# Patient Record
Sex: Female | Born: 1980 | Race: White | Hispanic: No | Marital: Married | Smoking: Never smoker
Health system: Southern US, Community
[De-identification: ages and names within clinical notes are randomized; demographics above are authoritative.]

## PROBLEM LIST (undated history)

## (undated) DIAGNOSIS — O039 Complete or unspecified spontaneous abortion without complication: Secondary | ICD-10-CM

## (undated) DIAGNOSIS — IMO0002 Reserved for concepts with insufficient information to code with codable children: Secondary | ICD-10-CM

## (undated) DIAGNOSIS — F32A Depression, unspecified: Secondary | ICD-10-CM

## (undated) DIAGNOSIS — O26643 Intrahepatic cholestasis of pregnancy, third trimester: Secondary | ICD-10-CM

## (undated) DIAGNOSIS — F419 Anxiety disorder, unspecified: Secondary | ICD-10-CM

## (undated) DIAGNOSIS — R51 Headache: Secondary | ICD-10-CM

## (undated) DIAGNOSIS — K589 Irritable bowel syndrome without diarrhea: Secondary | ICD-10-CM

## (undated) DIAGNOSIS — M5136 Other intervertebral disc degeneration, lumbar region: Secondary | ICD-10-CM

## (undated) DIAGNOSIS — M797 Fibromyalgia: Secondary | ICD-10-CM

## (undated) DIAGNOSIS — O26613 Liver and biliary tract disorders in pregnancy, third trimester: Secondary | ICD-10-CM

## (undated) DIAGNOSIS — K831 Obstruction of bile duct: Secondary | ICD-10-CM

## (undated) DIAGNOSIS — T7840XA Allergy, unspecified, initial encounter: Secondary | ICD-10-CM

## (undated) DIAGNOSIS — M51369 Other intervertebral disc degeneration, lumbar region without mention of lumbar back pain or lower extremity pain: Secondary | ICD-10-CM

## (undated) DIAGNOSIS — F329 Major depressive disorder, single episode, unspecified: Secondary | ICD-10-CM

## (undated) HISTORY — DX: Intrahepatic cholestasis of pregnancy, third trimester: O26.643

## (undated) HISTORY — PX: PILONIDAL CYST EXCISION: SHX744

## (undated) HISTORY — DX: Depression, unspecified: F32.A

## (undated) HISTORY — DX: Anxiety disorder, unspecified: F41.9

## (undated) HISTORY — PX: NASAL SINUS SURGERY: SHX719

## (undated) HISTORY — DX: Reserved for concepts with insufficient information to code with codable children: IMO0002

## (undated) HISTORY — DX: Major depressive disorder, single episode, unspecified: F32.9

## (undated) HISTORY — PX: COLONOSCOPY: SHX174

## (undated) HISTORY — DX: Allergy, unspecified, initial encounter: T78.40XA

## (undated) HISTORY — DX: Irritable bowel syndrome, unspecified: K58.9

## (undated) HISTORY — PX: CYSTOSCOPY: SUR368

## (undated) HISTORY — DX: Obstruction of bile duct: K83.1

## (undated) HISTORY — DX: Complete or unspecified spontaneous abortion without complication: O03.9

## (undated) HISTORY — DX: Liver and biliary tract disorders in pregnancy, third trimester: O26.613

---
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
## 2000-08-06 HISTORY — PX: COLPOSCOPY: SHX161

## 2001-11-20 ENCOUNTER — Emergency Department (HOSPITAL_COMMUNITY): Admission: EM | Admit: 2001-11-20 | Discharge: 2001-11-20 | Payer: Self-pay | Admitting: Emergency Medicine

## 2001-11-23 ENCOUNTER — Emergency Department (HOSPITAL_COMMUNITY): Admission: EM | Admit: 2001-11-23 | Discharge: 2001-11-23 | Payer: Self-pay | Admitting: Emergency Medicine

## 2001-11-27 ENCOUNTER — Encounter (HOSPITAL_COMMUNITY): Admission: RE | Admit: 2001-11-27 | Discharge: 2002-02-25 | Payer: Self-pay | Admitting: Emergency Medicine

## 2002-07-28 ENCOUNTER — Other Ambulatory Visit: Admission: RE | Admit: 2002-07-28 | Discharge: 2002-07-28 | Payer: Self-pay | Admitting: Family Medicine

## 2002-11-17 ENCOUNTER — Other Ambulatory Visit: Admission: RE | Admit: 2002-11-17 | Discharge: 2002-11-17 | Payer: Self-pay | Admitting: Family Medicine

## 2003-06-03 ENCOUNTER — Other Ambulatory Visit: Admission: RE | Admit: 2003-06-03 | Discharge: 2003-06-03 | Payer: Self-pay | Admitting: Obstetrics and Gynecology

## 2003-09-08 ENCOUNTER — Other Ambulatory Visit: Admission: RE | Admit: 2003-09-08 | Discharge: 2003-09-08 | Payer: Self-pay | Admitting: Obstetrics and Gynecology

## 2004-03-15 ENCOUNTER — Other Ambulatory Visit: Admission: RE | Admit: 2004-03-15 | Discharge: 2004-03-15 | Payer: Self-pay | Admitting: Obstetrics and Gynecology

## 2004-06-26 ENCOUNTER — Ambulatory Visit: Payer: Self-pay | Admitting: Family Medicine

## 2004-09-15 ENCOUNTER — Ambulatory Visit: Payer: Self-pay | Admitting: Unknown Physician Specialty

## 2005-02-03 ENCOUNTER — Encounter: Payer: Self-pay | Admitting: Family Medicine

## 2005-02-03 LAB — CONVERTED CEMR LAB

## 2005-03-29 ENCOUNTER — Other Ambulatory Visit: Admission: RE | Admit: 2005-03-29 | Discharge: 2005-03-29 | Payer: Self-pay | Admitting: Obstetrics and Gynecology

## 2005-05-07 ENCOUNTER — Ambulatory Visit: Payer: Self-pay | Admitting: Family Medicine

## 2005-10-02 ENCOUNTER — Ambulatory Visit: Payer: Self-pay | Admitting: Family Medicine

## 2005-10-24 ENCOUNTER — Ambulatory Visit: Payer: Self-pay | Admitting: Family Medicine

## 2005-11-02 ENCOUNTER — Ambulatory Visit: Payer: Self-pay | Admitting: Family Medicine

## 2005-11-13 ENCOUNTER — Ambulatory Visit: Payer: Self-pay | Admitting: Family Medicine

## 2005-11-14 ENCOUNTER — Ambulatory Visit: Payer: Self-pay | Admitting: *Deleted

## 2005-11-20 ENCOUNTER — Ambulatory Visit: Payer: Self-pay | Admitting: Family Medicine

## 2006-01-01 ENCOUNTER — Ambulatory Visit: Payer: Self-pay | Admitting: Internal Medicine

## 2006-01-02 ENCOUNTER — Ambulatory Visit: Payer: Self-pay | Admitting: Internal Medicine

## 2006-01-21 ENCOUNTER — Ambulatory Visit: Payer: Self-pay | Admitting: Specialist

## 2006-03-06 ENCOUNTER — Ambulatory Visit: Payer: Self-pay | Admitting: Family Medicine

## 2006-05-20 ENCOUNTER — Ambulatory Visit: Payer: Self-pay | Admitting: Family Medicine

## 2006-11-06 ENCOUNTER — Ambulatory Visit: Payer: Self-pay | Admitting: Family Medicine

## 2006-11-07 ENCOUNTER — Encounter: Payer: Self-pay | Admitting: Family Medicine

## 2007-02-24 ENCOUNTER — Telehealth (INDEPENDENT_AMBULATORY_CARE_PROVIDER_SITE_OTHER): Payer: Self-pay | Admitting: *Deleted

## 2007-05-12 ENCOUNTER — Ambulatory Visit: Payer: Self-pay | Admitting: Family Medicine

## 2007-05-12 DIAGNOSIS — G43909 Migraine, unspecified, not intractable, without status migrainosus: Secondary | ICD-10-CM | POA: Insufficient documentation

## 2007-05-12 DIAGNOSIS — M5137 Other intervertebral disc degeneration, lumbosacral region: Secondary | ICD-10-CM | POA: Insufficient documentation

## 2007-05-12 DIAGNOSIS — L708 Other acne: Secondary | ICD-10-CM | POA: Insufficient documentation

## 2007-06-27 ENCOUNTER — Ambulatory Visit: Payer: Self-pay | Admitting: Family Medicine

## 2007-08-20 ENCOUNTER — Ambulatory Visit: Payer: Self-pay | Admitting: Family Medicine

## 2007-12-30 ENCOUNTER — Ambulatory Visit: Payer: Self-pay | Admitting: Family Medicine

## 2007-12-30 DIAGNOSIS — J309 Allergic rhinitis, unspecified: Secondary | ICD-10-CM | POA: Insufficient documentation

## 2008-04-21 ENCOUNTER — Ambulatory Visit: Payer: Self-pay | Admitting: Family Medicine

## 2008-05-04 ENCOUNTER — Encounter: Admission: RE | Admit: 2008-05-04 | Discharge: 2008-05-04 | Payer: Self-pay | Admitting: Obstetrics and Gynecology

## 2008-07-21 ENCOUNTER — Ambulatory Visit: Payer: Self-pay | Admitting: Family Medicine

## 2008-07-21 LAB — CONVERTED CEMR LAB: Rapid Strep: NEGATIVE

## 2008-09-08 ENCOUNTER — Ambulatory Visit: Payer: Self-pay | Admitting: Family Medicine

## 2008-09-08 DIAGNOSIS — L0591 Pilonidal cyst without abscess: Secondary | ICD-10-CM | POA: Insufficient documentation

## 2008-09-20 ENCOUNTER — Encounter: Payer: Self-pay | Admitting: Family Medicine

## 2008-11-01 ENCOUNTER — Ambulatory Visit: Payer: Self-pay | Admitting: General Surgery

## 2008-11-04 ENCOUNTER — Encounter: Payer: Self-pay | Admitting: Family Medicine

## 2008-11-04 ENCOUNTER — Ambulatory Visit: Payer: Self-pay | Admitting: General Surgery

## 2008-11-11 ENCOUNTER — Encounter: Payer: Self-pay | Admitting: Family Medicine

## 2009-06-06 ENCOUNTER — Telehealth: Payer: Self-pay | Admitting: Family Medicine

## 2009-06-07 ENCOUNTER — Ambulatory Visit: Payer: Self-pay | Admitting: Family Medicine

## 2009-08-29 ENCOUNTER — Inpatient Hospital Stay (HOSPITAL_COMMUNITY): Admission: AD | Admit: 2009-08-29 | Discharge: 2009-08-29 | Payer: Self-pay | Admitting: Obstetrics and Gynecology

## 2009-08-29 ENCOUNTER — Ambulatory Visit: Payer: Self-pay | Admitting: Obstetrics and Gynecology

## 2009-10-21 ENCOUNTER — Ambulatory Visit: Payer: Self-pay | Admitting: Family Medicine

## 2009-12-04 ENCOUNTER — Inpatient Hospital Stay (HOSPITAL_COMMUNITY): Admission: AD | Admit: 2009-12-04 | Discharge: 2009-12-06 | Payer: Self-pay | Admitting: Obstetrics and Gynecology

## 2010-04-20 ENCOUNTER — Ambulatory Visit: Payer: Self-pay | Admitting: Internal Medicine

## 2010-04-20 DIAGNOSIS — J019 Acute sinusitis, unspecified: Secondary | ICD-10-CM | POA: Insufficient documentation

## 2010-04-20 DIAGNOSIS — J329 Chronic sinusitis, unspecified: Secondary | ICD-10-CM | POA: Insufficient documentation

## 2010-04-26 ENCOUNTER — Ambulatory Visit: Payer: Self-pay | Admitting: Family Medicine

## 2010-07-13 ENCOUNTER — Ambulatory Visit: Payer: Self-pay | Admitting: Oncology

## 2010-08-03 LAB — CBC WITH DIFFERENTIAL/PLATELET
Basophils Absolute: 0 10*3/uL (ref 0.0–0.1)
Eosinophils Absolute: 0.2 10*3/uL (ref 0.0–0.5)
HCT: 42.8 % (ref 34.8–46.6)
HGB: 14.9 g/dL (ref 11.6–15.9)
MCV: 93.4 fL (ref 79.5–101.0)
MONO%: 6.8 % (ref 0.0–14.0)
NEUT#: 5.5 10*3/uL (ref 1.5–6.5)
NEUT%: 72.2 % (ref 38.4–76.8)
RDW: 12.4 % (ref 11.2–14.5)
lymph#: 1.4 10*3/uL (ref 0.9–3.3)

## 2010-08-04 LAB — COMPREHENSIVE METABOLIC PANEL
Albumin: 4.4 g/dL (ref 3.5–5.2)
BUN: 14 mg/dL (ref 6–23)
Calcium: 9.5 mg/dL (ref 8.4–10.5)
Chloride: 101 mEq/L (ref 96–112)
Glucose, Bld: 98 mg/dL (ref 70–99)
Potassium: 3.9 mEq/L (ref 3.5–5.3)

## 2010-08-04 LAB — APTT: aPTT: 37 seconds (ref 24–37)

## 2010-08-04 LAB — PROTHROMBIN TIME
INR: 1.03 (ref <1.50)
Prothrombin Time: 13.7 seconds (ref 11.6–15.2)

## 2010-08-28 ENCOUNTER — Ambulatory Visit
Admission: RE | Admit: 2010-08-28 | Discharge: 2010-08-28 | Payer: Self-pay | Source: Home / Self Care | Attending: Family Medicine | Admitting: Family Medicine

## 2010-08-31 ENCOUNTER — Telehealth: Payer: Self-pay | Admitting: Family Medicine

## 2010-09-05 NOTE — Assessment & Plan Note (Signed)
Summary: STREP THROAT/RBH   Vital Signs:  Patient profile:   30 year old female Weight:      161.13 pounds Temp:     98.2 degrees F oral Pulse rate:   80 / minute Pulse rhythm:   regular BP sitting:   110 / 62  (left arm) Cuff size:   regular  Vitals Entered By: Linde Gillis CMA Duncan Dull) (October 21, 2009 3:27 PM)   Acute Visit History:      The patient complains of fever, headache, and sore throat.  These symptoms began 2 days ago.  She denies cough, earache, and nasal discharge.  Other comments include: [redacted] weeks pregnant No OTC meds. .        Her highest temperature has been low grade..99.6.        Problems Prior to Update: 1)  Uri  (ICD-465.9) 2)  Pilonidal Cyst  (ICD-685.1) 3)  Allergic Rhinitis  (ICD-477.9) 4)  Acne Vulgaris  (ICD-706.1) 5)  Migraine  (ICD-346.90) 6)  Degenerative Disc Disease, Lumbar Spine  (ICD-722.52)  Current Medications (verified): 1)  Prenatal Vitamins 0.8 Mg Tabs (Prenatal Multivit-Min-Fe-Fa) .... Take One Tablet By Mouth Daily  Allergies: 1)  Flexeril  Past History:  Past medical, surgical, family and social histories (including risk factors) reviewed, and no changes noted (except as noted below).  Past Medical History: Reviewed history from 09/08/2008 and no changes required. allergic rhinitis  acne  migraine  Past Surgical History: Reviewed history from 05/12/2007 and no changes required. LEEP (2004) CT- ovarian cyst, right (11/2005) [place]- same Colonoscopy- hemorrhoids (12/2005)  Family History: Reviewed history and no changes required.  Social History: Reviewed history from 05/12/2007 and no changes required. Marital Status single Children: none Occupation: [employer]  Review of Systems General:  Complains of fatigue; denies chills. CV:  Denies chest pain or discomfort. GI:  Denies abdominal pain.  Physical Exam  General:  Well-developed,well-nourished,in no acute distress; alert,appropriate and cooperative throughout  examination Head:  No maxilarty sinus ttp Ears:  External ear exam shows no significant lesions or deformities.  Otoscopic examination reveals clear canals, tympanic membranes are intact bilaterally without bulging, retraction, inflammation or discharge. Hearing is grossly normal bilaterally. Nose:  External nasal examination shows no deformity or inflammation. Nasal mucosa are pink and moist without lesions or exudates. Mouth:  MMM, pharyngeal erythema.   Neck:  no carotid bruit or thyromegaly no cervical or supraclavicular lymphadenopathy  Lungs:  Normal respiratory effort, chest expands symmetrically. Lungs are clear to auscultation, no crackles or wheezes. Heart:  Normal rate and regular rhythm. S1 and S2 normal without gallop, murmur, click, rub or other extra sounds.   Impression & Recommendations:  Problem # 1:  SORE THROAT (ICD-462) Viral pahryngitis.Marland Kitchentreat with symptomatic care. Call if not imrpoving as tolerated.  Orders: Rapid Strep (44010)  Complete Medication List: 1)  Prenatal Vitamins 0.8 Mg Tabs (Prenatal multivit-min-fe-fa) .... Take one tablet by mouth daily  Patient Instructions: 1)  Tylenol for throat pain.  2)  Push fluids. 3)  Rest, call if not improving as expected in 7-10 days.   Current Allergies (reviewed today): FLEXERIL   Laboratory Results   Date/Time Reported: October 21, 2009 3:58 PM   Other Tests  Rapid Strep: negative  Kit Test Internal QC: Positive   (Normal Range: Negative)

## 2010-09-05 NOTE — Assessment & Plan Note (Signed)
Summary: SINUS INFECTION/CLE   Vital Signs:  Patient profile:   30 year old female Height:      63 inches Weight:      136 pounds BMI:     24.18 Temp:     98.2 degrees F oral Pulse rate:   76 / minute Pulse rhythm:   regular BP sitting:   110 / 68  (left arm) Cuff size:   regular  Vitals Entered By: Sydell Axon LPN (April 26, 2010 12:02 PM) CC: Was seen last week for sinus infection, nauseated and has been running a fever   History of Present Illness: started on amox 6 days ago for sinus infection is actually gettting worse  running a fever and nauseated and can almost not function bad pain above the eyes  taking med -- but thinks the drainage is making her nauseated   has been running fever 101.3 last night   this feels different than a cold  fairly severe sinus pain above the eyes but worse on the right   throat - is scratchy ears -- congestion and fullness without pain  breastfeeding  baby doing great no one else sick   is using netti pot   Allergies: 1)  Flexeril  Past History:  Past Medical History: Last updated: 09/08/2008 allergic rhinitis  acne  migraine  Past Surgical History: Last updated: 05/12/2007 LEEP (2004) CT- ovarian cyst, right (11/2005) [place]- same Colonoscopy- hemorrhoids (12/2005)  Social History: Last updated: 04/20/2010 Marital Status single Children: daughter (41) Occupation: [employer]  Risk Factors: Smoking Status: never (05/12/2007)  Review of Systems General:  Complains of chills, fatigue, fever, loss of appetite, and malaise. Eyes:  Complains of eye irritation; denies discharge. ENT:  Complains of hoarseness, nasal congestion, postnasal drainage, sinus pressure, and sore throat. CV:  Denies chest pain or discomfort and palpitations. Resp:  Complains of cough; denies sputum productive and wheezing. GI:  Denies abdominal pain, diarrhea, nausea, and vomiting. Derm:  Denies rash.  Physical Exam  General:   Well-developed,well-nourished,in no acute distress; alert,appropriate and cooperative throughout examination Head:  marked frontal and ethmoid sinus tenderness normocephalic, atraumatic, and no abnormalities observed.   Eyes:  vision grossly intact, pupils equal, pupils round, and pupils reactive to light.  no conjunctival pallor, injection or icterus  Ears:  R ear normal and L ear normal.   Nose:  nares are injected and congested bilaterally - worse on R Mouth:  pharynx pink and moist, no erythema, and no exudates.   Neck:  No deformities, masses, or tenderness noted. Lungs:  Normal respiratory effort, chest expands symmetrically. Lungs are clear to auscultation, no crackles or wheezes. Heart:  Normal rate and regular rhythm. S1 and S2 normal without gallop, murmur, click, rub or other extra sounds. Skin:  Intact without suspicious lesions or rashes Cervical Nodes:  No lymphadenopathy noted Psych:  normal affect, talkative and pleasant    Impression & Recommendations:  Problem # 1:  SINUSITIS, ACUTE (ICD-461.9) Assessment Deteriorated  worsening with amox will cautiously change to augmentin (pt is nursing and we reviewed poss side eff to her and infant) recommend sympt care- see pt instructions   pt advised to update me if symptoms worsen or do not improve  The following medications were removed from the medication list:    Amoxicillin 500 Mg Caps (Amoxicillin) .Marland Kitchen... Take one by mouth two times a day x 10 days Her updated medication list for this problem includes:    Augmentin 875-125 Mg Tabs (Amoxicillin-pot  clavulanate) .Marland Kitchen... 1 by mouth two times a day for 10 days for sinus infection  Orders: Prescription Created Electronically [phone])  Complete Medication List: 1)  Prenatal Vitamins 0.8 Mg Tabs (Prenatal multivit-min-fe-fa) .... Take one tablet by mouth daily 2)  Augmentin 875-125 Mg Tabs (Amoxicillin-pot clavulanate) .Marland Kitchen.. 1 by mouth two times a day for 10 days for sinus  infection  Patient Instructions: 1)  continue nasal saline saline rinse 2)  tylenol is ok  3)  lots of fluids  4)  take the augmentin as directed  5)  please update me if worse or not improved by early next week  Prescriptions: AUGMENTIN 875-125 MG TABS (AMOXICILLIN-POT CLAVULANATE) 1 by mouth two times a day for 10 days for sinus infection  #20 x 0   Entered and Authorized by:   Judith Part MD   Signed by:   Judith Part MD on 04/26/2010   Method used:   [facility] to        [id number]       [address]       [place], [place]  [address]       Ph: [phone]       Fax: [phone]   RxID:   [id number]   Current Allergies (reviewed today): FLEXERIL

## 2010-09-05 NOTE — Assessment & Plan Note (Signed)
Summary: ?SINUS INFECTION/CLE   Vital Signs:  Patient profile:   30 year old female Weight:      137.75 pounds Temp:     98.3 degrees F oral Pulse rate:   86 / minute Pulse rhythm:   regular BP sitting:   104 / 60  (left arm) Cuff size:   regular  Vitals Entered By: Selena Batten Dance CMA Duncan Dull) (April 20, 2010 4:06 PM) CC: ? sinus infection (symptoms x2 weeks)   History of Present Illness: CC: sinus congestion x 2 wks.    2wk h/o sinus congestion, last 2 days worse.  With tooth pain.  Now with ear pain.  No fevers/chills.  + h/o sinus issues in past.  Worse with leaning head forward.  Mild cough.  + PNdripping.  initially better but now seems to be getting worse.  No abd pain, n/v/d.    Breast feeding, so hasn't realy taken anything.  Tried neti pot, teas, tylenol.  4 years ago had "turbinates closed" sinus surgery.  No smokers at home.  No sick contacts at home.  + h/o allergies but doesn't take nasal steroids because told to stay away from them after her sinus surgery ?  Current Medications (verified): 1)  Prenatal Vitamins 0.8 Mg Tabs (Prenatal Multivit-Min-Fe-Fa) .... Take One Tablet By Mouth Daily  Allergies: 1)  Flexeril  Past History:  Past Medical History: Last updated: 09/08/2008 allergic rhinitis  acne  migraine  Social History: Last updated: 04/20/2010 Marital Status single Children: daughter (37) Occupation: [employer]  Social History: Marital Status single Children: daughter (2010) Occupation: [employer]  Review of Systems       per HPI  Physical Exam  General:  Well-developed,well-nourished,in no acute distress; alert,appropriate and cooperative throughout examination Head:  + maxillary sinus ttp B Eyes:  vision grossly intact, pupils equal, pupils round, and pupils reactive to light.  no conjunctival pallor, injection or icterus  Ears:  External ear exam shows no significant lesions or deformities.  Otoscopic examination reveals  clear canals, tympanic membranes are intact bilaterally without bulging, retraction, inflammation or discharge. Hearing is grossly normal bilaterally. Nose:  External nasal examination shows no deformity or inflammation. Nasal mucosa are pink and moist without lesions or exudates. Mouth:  MMM, pharyngeal erythema.   Neck:  no carotid bruit or thyromegaly no cervical or supraclavicular lymphadenopathy  Lungs:  Normal respiratory effort, chest expands symmetrically. Lungs are clear to auscultation, no crackles or wheezes. Heart:  Normal rate and regular rhythm. S1 and S2 normal without gallop, murmur, click, rub or other extra sounds. Pulses:  2+ rad pulses Extremities:  no edema Skin:  Intact without suspicious lesions or rashes   Impression & Recommendations:  Problem # 1:  SINUSITIS, ACUTE (ICD-461.9) Instructed on treatment. Call if symptoms persist or worsen.  breast feeding so staying away from expectorant.  Abx, red flags to return discussed.  Her updated medication list for this problem includes:    Amoxicillin 500 Mg Caps (Amoxicillin) .Marland Kitchen... Take one by mouth two times a day x 10 days  Complete Medication List: 1)  Prenatal Vitamins 0.8 Mg Tabs (Prenatal multivit-min-fe-fa) .... Take one tablet by mouth daily 2)  Amoxicillin 500 Mg Caps (Amoxicillin) .... Take one by mouth two times a day x 10 days  Patient Instructions: 1)  Course of antibiotics for sinus infection. 2)  Pleasure to meet you today.  Continue neti pot, lots of fluids, lots of rest. 3)  Hope you feel better. 4)  If  you start having fevers >101.5, drooling, trouble opening mouth or swallowing you may need to be seen again. Prescriptions: AMOXICILLIN 500 MG CAPS (AMOXICILLIN) take one by mouth two times a day x 10 days  #20 x 0   Entered and Authorized by:   Eustaquio Boyden  MD   Signed by:   Eustaquio Boyden  MD on 04/20/2010   Method used:   Electronically to        [id number]        [address]       [place], [place]  [address]       Ph: [phone]       Fax: [phone]   RxID:   [id number]   Current Allergies (reviewed today): FLEXERIL

## 2010-09-07 NOTE — Progress Notes (Signed)
Summary: feeling worse  Phone Note Call from Patient Call back at Home Phone [phone]   Caller: Patient Call For: Judith Part MD Summary of Call: Patient says that she is not feeling any better, she is having alot of pressure in her head, her teeth hurt, alot of congestion, coughing on and off, low grade fever. She feels like it is getting worse.  Patient is asking [facility] [address]  Initial call taken by: Melody Comas,  August 31, 2010 4:13 PM    New/Updated Medications: AUGMENTIN 875-125 MG TABS (AMOXICILLIN-POT CLAVULANATE) 1 by mouth 2 times daily x 10 days Prescriptions: AUGMENTIN 875-125 MG TABS (AMOXICILLIN-POT CLAVULANATE) 1 by mouth 2 times daily x 10 days  #20 x 0   Entered and Authorized by:   Ruthe Mannan MD   Signed by:   Ruthe Mannan MD on 08/31/2010   Method used:   Electronically to        [id number]       [address]       [place], [place]  [address]       Ph: [phone]       Fax: [phone]   RxID:   [id number]

## 2010-09-07 NOTE — Assessment & Plan Note (Signed)
Summary: EAR,ST,CONGESTION/CLE   Vital Signs:  Patient profile:   30 year old female Height:      63 inches Weight:      127 pounds BMI:     22.58 Temp:     98.6 degrees F oral Pulse rate:   95 / minute Pulse rhythm:   regular BP sitting:   110 / 80  (left arm) Cuff size:   regular  Vitals Entered By: Linde Gillis CMA Duncan Dull) (August 28, 2010 11:44 AM) CC: sore throat, congestion, ear pain   History of Present Illness: 30 yo with over 1 week of URI symptoms. Was getting better, last two days actually gettting worse  running a subjective fever. pos sinus pressure, teeth hurt.   throat - is scratchy ears  popping.  breastfeeding  baby has ear infection.     Current Medications (verified): 1)  Prenatal Vitamins 0.8 Mg Tabs (Prenatal Multivit-Min-Fe-Fa) .... Take One Tablet By Mouth Daily 2)  Zoloft 100 Mg Tabs (Sertraline Hcl) .... Take One Tablet By Mouth Daily 3)  Keflex 500 Mg Caps (Cephalexin) .Marland Kitchen.. 1 Tab By Mouth Two Times A Day X 7 Days  Allergies: 1)  Flexeril  Past History:  Past Medical History: Last updated: 09/08/2008 allergic rhinitis  acne  migraine  Past Surgical History: Last updated: 05/12/2007 LEEP (2004) CT- ovarian cyst, right (11/2005) [place]- same Colonoscopy- hemorrhoids (12/2005)  Social History: Last updated: 04/20/2010 Marital Status single Children: daughter (70) Occupation: [employer]  Risk Factors: Smoking Status: never (05/12/2007)  Review of Systems      See HPI General:  Complains of fever. ENT:  Complains of nasal congestion, postnasal drainage, sinus pressure, and sore throat. Resp:  Complains of cough.  Physical Exam  General:  Well-developed,well-nourished,in no acute distress; alert,appropriate and cooperative throughout examination Ears:  TMs retracted Nose:  nares are injected and congested bilaterally - worse on R Mouth:  pharynx pink and moist, no erythema, and no exudates.   Lungs:  Normal respiratory  effort, chest expands symmetrically. Lungs are clear to auscultation, no crackles or wheezes. Heart:  Normal rate and regular rhythm. S1 and S2 normal without gallop, murmur, click, rub or other extra sounds. Extremities:  no edema Psych:  normal affect, talkative and pleasant    Impression & Recommendations:  Problem # 1:  SINUSITIS, ACUTE (ICD-461.9) Assessment New Given duration and progression of symptoms, will treat with abx. Given that she is breast feeding, will use cephalosporin instead of amoxicillin. The following medications were removed from the medication list:    Augmentin 875-125 Mg Tabs (Amoxicillin-pot clavulanate) .Marland Kitchen... 1 by mouth two times a day for 10 days for sinus infection    Amoxicillin 500 Mg Tabs (Amoxicillin) .Marland Kitchen... 1 tab by mouth two times a day x 10 days Her updated medication list for this problem includes:    Keflex 500 Mg Caps (Cephalexin) .Marland Kitchen... 1 tab by mouth two times a day x 7 days  Complete Medication List: 1)  Prenatal Vitamins 0.8 Mg Tabs (Prenatal multivit-min-fe-fa) .... Take one tablet by mouth daily 2)  Zoloft 100 Mg Tabs (Sertraline hcl) .... Take one tablet by mouth daily 3)  Keflex 500 Mg Caps (Cephalexin) .Marland Kitchen.. 1 tab by mouth two times a day x 7 days Prescriptions: KEFLEX 500 MG CAPS (CEPHALEXIN) 1 tab by mouth two times a day x 7 days  #14 x 0   Entered and Authorized by:   Ruthe Mannan MD   Signed by:   Ruthe Mannan  MD on 08/28/2010   Method used:   Electronically to        [id number]       [address]       [place], [place]  [address]       Ph: [phone]       Fax: [phone]   RxID:   [id number] AMOXICILLIN 500 MG TABS (AMOXICILLIN) 1 tab by mouth two times a day x 10 days  #20 x 0   Entered and Authorized by:   Ruthe Mannan MD   Signed by:   Ruthe Mannan MD on 08/28/2010   Method used:   Electronically to        [id number]       [address]       [place], [place]   [address]       Ph: [phone]       Fax: [phone]   RxID:   [id number]    Orders Added: 1)  Est. Patient Level III [07371]    Current Allergies (reviewed today): FLEXERIL  Appended Document: EAR,ST,CONGESTION/CLE Rx for Amoxicillin cancelled.

## 2010-09-29 ENCOUNTER — Ambulatory Visit: Payer: Self-pay | Admitting: Family Medicine

## 2010-10-16 ENCOUNTER — Ambulatory Visit: Payer: Self-pay | Admitting: Unknown Physician Specialty

## 2010-10-23 LAB — CBC
Hemoglobin: 12.9 g/dL (ref 12.0–15.0)
RBC: 3.73 MIL/uL — ABNORMAL LOW (ref 3.87–5.11)
RDW: 12.6 % (ref 11.5–15.5)

## 2010-10-23 LAB — COMPREHENSIVE METABOLIC PANEL
ALT: 14 U/L (ref 0–35)
AST: 17 U/L (ref 0–37)
Alkaline Phosphatase: 60 U/L (ref 39–117)
CO2: 25 mEq/L (ref 19–32)
GFR calc Af Amer: >60 mL/min (ref >60)
Glucose, Bld: 85 mg/dL (ref 70–99)
Potassium: 3.7 mEq/L (ref 3.5–5.1)
Sodium: 136 mEq/L (ref 135–145)
Total Protein: 5.9 g/dL — ABNORMAL LOW (ref 6.0–8.3)

## 2010-10-23 LAB — URINALYSIS, ROUTINE W REFLEX MICROSCOPIC
Bilirubin Urine: NEGATIVE
Ketones, ur: NEGATIVE mg/dL
Nitrite: NEGATIVE
Protein, ur: NEGATIVE mg/dL
Urobilinogen, UA: 0.2 mg/dL (ref 0.0–1.0)

## 2010-10-23 LAB — LIPASE, BLOOD: Lipase: 30 U/L (ref 11–59)

## 2010-10-23 LAB — AMYLASE: Amylase: 78 U/L (ref 0–105)

## 2010-10-23 LAB — DIC (DISSEMINATED INTRAVASCULAR COAGULATION)PANEL
Fibrinogen: 370 mg/dL (ref 204–475)
INR: 0.95 (ref 0.00–1.49)
Platelets: 205 10*3/uL (ref 150–400)
Smear Review: NONE SEEN

## 2010-10-24 LAB — CBC
Hemoglobin: 14.6 g/dL (ref 12.0–15.0)
MCHC: 34.8 g/dL (ref 30.0–36.0)
MCV: 94.7 fL (ref 78.0–100.0)
Platelets: 174 10*3/uL (ref 150–400)
Platelets: 234 10*3/uL (ref 150–400)
RBC: 4.01 MIL/uL (ref 3.87–5.11)
RDW: 13.4 % (ref 11.5–15.5)
WBC: 16.6 10*3/uL — ABNORMAL HIGH (ref 4.0–10.5)

## 2010-10-24 LAB — RPR: RPR Ser Ql: NONREACTIVE

## 2011-03-31 ENCOUNTER — Ambulatory Visit: Payer: Self-pay | Admitting: Orthopedic Surgery

## 2011-11-04 ENCOUNTER — Emergency Department: Payer: Self-pay | Admitting: *Deleted

## 2011-11-04 LAB — URINALYSIS, COMPLETE
Bilirubin,UR: NEGATIVE
Leukocyte Esterase: NEGATIVE
Nitrite: NEGATIVE
Ph: 7 (ref 4.5–8.0)
RBC,UR: 246 /HPF (ref 0–5)
Specific Gravity: 1.005 (ref 1.003–1.030)
WBC UR: 4 /HPF (ref 0–5)

## 2011-11-05 ENCOUNTER — Encounter (HOSPITAL_COMMUNITY)
Admission: RE | Disposition: A | Discharge status: Home / Self Care | Payer: Self-pay | Source: Ambulatory Visit | Attending: Urology

## 2011-11-05 ENCOUNTER — Encounter (HOSPITAL_COMMUNITY): Payer: Self-pay | Admitting: Anesthesiology

## 2011-11-05 ENCOUNTER — Ambulatory Visit (HOSPITAL_COMMUNITY): Payer: BC Managed Care – PPO | Admitting: Anesthesiology

## 2011-11-05 ENCOUNTER — Other Ambulatory Visit: Payer: Self-pay | Admitting: Urology

## 2011-11-05 ENCOUNTER — Encounter (HOSPITAL_COMMUNITY): Payer: Self-pay | Admitting: *Deleted

## 2011-11-05 ENCOUNTER — Encounter (HOSPITAL_COMMUNITY): Payer: Self-pay

## 2011-11-05 ENCOUNTER — Ambulatory Visit (HOSPITAL_COMMUNITY)
Admission: RE | Admit: 2011-11-05 | Discharge: 2011-11-05 | Disposition: A | Discharge status: Home / Self Care | Payer: BC Managed Care – PPO | Source: Ambulatory Visit | Attending: Urology | Admitting: Urology

## 2011-11-05 DIAGNOSIS — N201 Calculus of ureter: Secondary | ICD-10-CM | POA: Insufficient documentation

## 2011-11-05 HISTORY — DX: Headache: R51

## 2011-11-05 HISTORY — DX: Fibromyalgia: M79.7

## 2011-11-05 HISTORY — DX: Other intervertebral disc degeneration, lumbar region: M51.36

## 2011-11-05 HISTORY — DX: Other intervertebral disc degeneration, lumbar region without mention of lumbar back pain or lower extremity pain: M51.369

## 2011-11-05 LAB — SURGICAL PCR SCREEN
MRSA, PCR: NEGATIVE
Staphylococcus aureus: NEGATIVE

## 2011-11-05 LAB — PREGNANCY, URINE: Preg Test, Ur: NEGATIVE

## 2011-11-05 SURGERY — CYSTOURETEROSCOPY, WITH RETROGRADE PYELOGRAM AND STENT INSERTION
Anesthesia: General | Site: Ureter | Laterality: Left | Wound class: Clean Contaminated

## 2011-11-05 MED ORDER — MUPIROCIN 2 % EX OINT
TOPICAL_OINTMENT | CUTANEOUS | Status: AC
  Filled 2011-11-05: qty 22

## 2011-11-05 MED ORDER — ACETAMINOPHEN 10 MG/ML IV SOLN
INTRAVENOUS | Status: DC | PRN
  Administered 2011-11-05: 1000 mg via INTRAVENOUS

## 2011-11-05 MED ORDER — FENTANYL CITRATE 0.05 MG/ML IJ SOLN: 25.0000 ug | INTRAMUSCULAR | Status: DC | PRN

## 2011-11-05 MED ORDER — IOHEXOL 300 MG/ML  SOLN
INTRAMUSCULAR | Status: DC | PRN
  Administered 2011-11-05: 6 mL via INTRAVENOUS

## 2011-11-05 MED ORDER — ONDANSETRON HCL 4 MG/2ML IJ SOLN
INTRAMUSCULAR | Status: DC | PRN
  Administered 2011-11-05: 4 mg via INTRAVENOUS

## 2011-11-05 MED ORDER — CEFAZOLIN SODIUM 1-5 GM-% IV SOLN
INTRAVENOUS | Status: AC
  Filled 2011-11-05: qty 50

## 2011-11-05 MED ORDER — ACETAMINOPHEN 10 MG/ML IV SOLN
INTRAVENOUS | Status: AC
  Filled 2011-11-05: qty 100

## 2011-11-05 MED ORDER — FENTANYL CITRATE 0.05 MG/ML IJ SOLN
INTRAMUSCULAR | Status: DC | PRN
  Administered 2011-11-05: 25 ug via INTRAVENOUS
  Administered 2011-11-05: 50 ug via INTRAVENOUS

## 2011-11-05 MED ORDER — HYDROCODONE-ACETAMINOPHEN 5-325 MG PO TABS: 1.0000 | ORAL_TABLET | ORAL | Status: DC | PRN

## 2011-11-05 MED ORDER — HYDROMORPHONE HCL PF 4 MG/ML IJ SOLN
INTRAMUSCULAR | Status: AC
  Administered 2011-11-05: 1 mg via INTRAVENOUS
  Filled 2011-11-05: qty 1

## 2011-11-05 MED ORDER — DEXAMETHASONE SODIUM PHOSPHATE 10 MG/ML IJ SOLN
INTRAMUSCULAR | Status: DC | PRN
  Administered 2011-11-05: 8 mg via INTRAVENOUS

## 2011-11-05 MED ORDER — HYDROMORPHONE HCL PF 4 MG/ML IJ SOLN
4.0000 mg | Freq: Once | INTRAMUSCULAR | Status: AC
  Administered 2011-11-05: 1 mg via INTRAVENOUS

## 2011-11-05 MED ORDER — MEPERIDINE HCL 50 MG/ML IJ SOLN
6.2500 mg | INTRAMUSCULAR | Status: DC | PRN
Start: 2011-11-05 — End: 2011-11-06
  Administered 2011-11-05: 12.5 mg via INTRAVENOUS

## 2011-11-05 MED ORDER — MEPERIDINE HCL 50 MG/ML IJ SOLN
INTRAMUSCULAR | Status: AC
  Filled 2011-11-05: qty 1

## 2011-11-05 MED ORDER — MIDAZOLAM HCL 5 MG/5ML IJ SOLN
INTRAMUSCULAR | Status: DC | PRN
  Administered 2011-11-05: 2 mg via INTRAVENOUS

## 2011-11-05 MED ORDER — PROMETHAZINE HCL 25 MG/ML IJ SOLN: 6.2500 mg | INTRAMUSCULAR | Status: DC | PRN

## 2011-11-05 MED ORDER — DROPERIDOL 2.5 MG/ML IJ SOLN
INTRAMUSCULAR | Status: DC | PRN
  Administered 2011-11-05: 0.625 mg via INTRAVENOUS

## 2011-11-05 MED ORDER — CEFAZOLIN SODIUM 1-5 GM-% IV SOLN
1.0000 g | INTRAVENOUS | Status: AC
  Administered 2011-11-05: 1 g via INTRAVENOUS

## 2011-11-05 MED ORDER — PROPOFOL 10 MG/ML IV EMUL
INTRAVENOUS | Status: DC | PRN
  Administered 2011-11-05: 110 mg via INTRAVENOUS

## 2011-11-05 MED ORDER — LACTATED RINGERS IV SOLN
INTRAVENOUS | Status: DC | PRN
  Administered 2011-11-05: 18:00:00 via INTRAVENOUS

## 2011-11-05 MED ORDER — IOHEXOL 300 MG/ML  SOLN
INTRAMUSCULAR | Status: AC
  Filled 2011-11-05: qty 1

## 2011-11-05 MED ORDER — LACTATED RINGERS IV SOLN: INTRAVENOUS | Status: DC

## 2011-11-05 SURGICAL SUPPLY — 17 items
ADAPTER CATH URET PLST 4-6FR (CATHETERS) ×2 IMPLANT
ADPR CATH URET STRL DISP 4-6FR (CATHETERS) ×1
BAG URO CATCHER STRL LF (DRAPE) ×2 IMPLANT
CATH INTERMIT  6FR 70CM (CATHETERS) ×1 IMPLANT
CATH URET 5FR 28IN CONE TIP (BALLOONS)
CATH URET 5FR 70CM CONE TIP (BALLOONS) IMPLANT
CLOTH BEACON ORANGE TIMEOUT ST (SAFETY) ×2 IMPLANT
DRAPE CAMERA CLOSED 9X96 (DRAPES) ×2 IMPLANT
GLOVE BIOGEL M 7.0 STRL (GLOVE) ×2 IMPLANT
GOWN STRL NON-REIN LRG LVL3 (GOWN DISPOSABLE) ×4 IMPLANT
MANIFOLD NEPTUNE II (INSTRUMENTS) ×2 IMPLANT
NS IRRIG 1000ML POUR BTL (IV SOLUTION) ×2 IMPLANT
PACK CYSTO (CUSTOM PROCEDURE TRAY) ×2 IMPLANT
SHEATH URET ACCESS 12FR/35CM (UROLOGICAL SUPPLIES) ×1 IMPLANT
STENT CONTOUR 6FRX24X.038 (STENTS) ×1 IMPLANT
TUBING CONNECTING 10 (TUBING) ×2 IMPLANT
WIRE COONS/BENSON .038X145CM (WIRE) ×2 IMPLANT

## 2011-11-05 NOTE — H&P (Signed)
History of Present Illness  [name] was seen in the ER at [facility] with a history of severe left flank pain associated with nausea and vomiting.  She had seen blood in her urine 3 days ago and went to a walk-in clinic.  She was started on antibiotics for presumed UTI.  The pain was severe yesterday and she went to the ER.  CT scan showed a 5 mm stone at the left UPJ with mild hydronephrosis.  She is still in severe pain and is unable to keep any food or liquids down.  She was given Toradol yesterday in the ER.  She denies previous history of kidney stone.  Urinalysis shows 0-3 RBC's, rare bacteria.   Current Meds 1. Cyclobenzaprine HCl 10 MG Oral Tablet; Therapy: (Recorded:01Apr2013) to 2. Flomax 0.4 MG Oral Capsule; Therapy: (Recorded:01Apr2013) to 3. Norco 5-325 MG Oral Tablet; Therapy: (Recorded:01Apr2013) to 4. Topamax 200 MG Oral Tablet; Therapy: (Recorded:01Apr2013) to 5. Zofran 4 MG Oral Tablet; Therapy: (Recorded:01Apr2013) to 6. Zoloft 50 MG Oral Tablet; Therapy: (Recorded:01Apr2013) to  Allergies Medication  1. No Known Drug Allergies  Family History Problems  1. Family history of  Family Health Status - Father's Age 36 2. Family history of  Family Health Status - Mother's Age 71 3. Family history of  Family Health Status Number Of Children 1 daughter  Social History Problems    Marital History - Currently Married   Never A Smoker   Occupation: Runner, broadcasting/film/video Denied    History of  Alcohol Use   History of  Caffeine Use  Review of Systems Genitourinary, constitutional, skin, eye, otolaryngeal, hematologic/lymphatic, cardiovascular, pulmonary, endocrine, musculoskeletal, gastrointestinal, neurological and psychiatric system(s) were reviewed and pertinent findings if present are noted.  Genitourinary: hematuria.  Gastrointestinal: nausea, vomiting and abdominal pain.  Constitutional: feeling tired (fatigue).  Musculoskeletal: back pain.     Vitals Vital Signs [Data Includes: Last 1 Day]  01Apr2013 01:51PM  BMI Calculated: 21.44 BSA Calculated: 1.56 Height: 5 ft 3 in Weight: 121 lb  Blood Pressure: 109 / 71 Temperature: 98.7 F Heart Rate: 68 Respiration: 18  Physical Exam Constitutional: Well nourished and well developed . No acute distress.  ENT:. The ears and nose are normal in appearance.  Neck: The appearance of the neck is normal and no neck mass is present.  Pulmonary: No respiratory distress and normal respiratory rhythm and effort.  Cardiovascular: Heart rate and rhythm are normal . No peripheral edema.  Abdomen: The abdomen is flat. The abdomen is soft and nontender. No masses are palpated. severe left CVA tenderness no CVA tenderness. No hernias are palpable. No hepatosplenomegaly noted.  Genitourinary: Examination of the external genitalia shows normal female external genitalia and no lesions. The urethra is normal in appearance and not tender. There is no urethral mass. Vaginal exam demonstrates no abnormalities. The adnexa are palpably normal. The bladder is non tender and not distended. The anus is normal on inspection. The perineum is normal on inspection.  Lymphatics: The femoral and inguinal nodes are not enlarged or tender.  Skin: Normal skin turgor, no visible rash and no visible skin lesions.  Neuro/Psych:. Mood and affect are appropriate.    Results/Data Urine [Data Includes: Last 1 Day]   01Apr2013  COLOR YELLOW   APPEARANCE CLEAR   SPECIFIC GRAVITY 1.020   pH 6.5   GLUCOSE NEG mg/dL  BILIRUBIN NEG   KETONE NEG mg/dL  BLOOD TRACE   PROTEIN NEG mg/dL  UROBILINOGEN 0.2 mg/dL  NITRITE NEG  LEUKOCYTE ESTERASE NEG   SQUAMOUS EPITHELIAL/HPF FEW   WBC NONE SEEN WBC/hpf  RBC 0-3 RBC/hpf  BACTERIA RARE   CRYSTALS NONE SEEN   CASTS NONE SEEN     I independently reviewed the CT scan with the patient and her husband and the findings are as noted above.   Assessment Assessed  1.  Hydronephrosis 591 2. Proximal Ureteral Stone On The Left 592.1  Plan Health Maintenance (V70.0)  1. UA With REFLEX  Done: 01Apr2013 01:27PM   Cystoscopy, left retrograde pyelogram JJ stent placement.  She will need ESL at a later date.  The procedure, risks, benefits were explained to the patient and her husband.  The risks of ESL include but are not limited to hemorrhage, renal or perirenal hematoma, steinstrasse, inability to fragment the stone.  The risks of stent placement include injury to the ureter, inability to insert the stent.  They understand and wish to proceed.   Signatures Electronically signed by : Su Grand, M.D.; Nov 05 2011  2:53PM

## 2011-11-05 NOTE — Transfer of Care (Signed)
Immediate Anesthesia Transfer of Care Note  Patient: Alejandra Cook  Procedure(s) Performed: Procedure(s) (LRB): CYSTOSCOPY WITH RETROGRADE PYELOGRAM, URETEROSCOPY AND STENT PLACEMENT (Left)  Patient Location: PACU  Anesthesia Type: General  Level of Consciousness: awake, sedated and patient cooperative  Airway & Oxygen Therapy: Patient Spontanous Breathing and Patient connected to face mask oxygen  Post-op Assessment: Report given to PACU RN and Post -op Vital signs reviewed and stable  Post vital signs: Reviewed and stable  Complications: No apparent anesthesia complications

## 2011-11-05 NOTE — Anesthesia Preprocedure Evaluation (Addendum)
Anesthesia Evaluation  Patient identified by MRN, date of birth, ID band Patient awake    Reviewed: Allergy & Precautions, H&P , NPO status , Patient's Chart, lab work & pertinent test results  Airway Mallampati: II TM Distance: >3 FB Neck ROM: Full    Dental  (+) Teeth Intact and Dental Advisory Given   Pulmonary neg pulmonary ROS,  breath sounds clear to auscultation  Pulmonary exam normal       Cardiovascular negative cardio ROS  Rhythm:Regular Rate:Normal     Neuro/Psych  Headaches,  Neuromuscular disease negative neurological ROS  negative psych ROS   GI/Hepatic negative GI ROS, Neg liver ROS,   Endo/Other  negative endocrine ROS  Renal/GU negative Renal ROS  negative genitourinary   Musculoskeletal negative musculoskeletal ROS (+) Fibromyalgia -  Abdominal   Peds  Hematology negative hematology ROS (+)   Anesthesia Other Findings   Reproductive/Obstetrics negative OB ROS                          Anesthesia Physical Anesthesia Plan  ASA: I and Emergent  Anesthesia Plan: General   Post-op Pain Management:    Induction: Intravenous  Airway Management Planned: LMA  Additional Equipment:   Intra-op Plan:   Post-operative Plan:   Informed Consent: I have reviewed the patients History and Physical, chart, labs and discussed the procedure including the risks, benefits and alternatives for the proposed anesthesia with the patient or authorized representative who has indicated his/her understanding and acceptance.   Dental advisory given  Plan Discussed with: CRNA  Anesthesia Plan Comments:         Anesthesia Quick Evaluation

## 2011-11-05 NOTE — Op Note (Signed)
Alejandra Cook is a 31 y.o.   11/05/2011  Pre Op Diagnosis: Left UPJ calculus  Post Op Diagnosis: Same  Procedure done: Cystoscopy, left retrograde pyelogram and insertion of left double-J stent  Surgeon: Wendie Simmer. [name]  Anesthesia: General  Indication: Patient is a 31 years old female who was seen in the emergency room at [facility] with a sudden onset of severe left flank pain associated with nausea and vomiting. CT scan showed a 5 mm stone at the left UPJ with mild hydronephrosis. She continued to complain of pain. She came to the office in severe pain. She received Toradol yesterday. She is scheduled for cystoscopy, left retrograde pyelogram and double-J stent. The stone will be treated at a later date with ESL.  Procedure: The patient was identified by her wrist band and proper timeout was taken.  Under general anesthesia patient was prepped and draped and placed in the dorsolithotomy position. A panendoscope was inserted in the bladder. The bladder mucosa is erythematous with submucosal hemorrhage. There is no stone or tumor in the bladder. The ureteral orifices are in normal position and shape.  Retrograde pyelogram:  A sensor wire was passed through a #6 [place] open-ended catheter and passed through  the cystoscope and the left ureteral orifice. The the Guiidewire was then removed. Contrast was then injected through the open-ended catheter. The ureter appears normal. There is a filling defect at the U PJ with moderate dilation of the renal pelvis and the calyces. The sensor wire was then passed through the open-ended catheter up to the renal pelvis. The open-ended catheter was then removed.  A #6 French/24 double-J stent was then passed over the sensor wire but could not advanced beyond the distal ureter. The double-J stent was then removed. The intramural ureter was then dilated with the ureteroscope access sheath. Then the double-J stent was passed over the sensor wire  without difficulty. The proximal curl of the double-J stent is in the renal pelvis; the distal curl is in the bladder.  The bladder was then emptied and the cystoscope and guidewire were removed.  The patient tolerated the procedure well and left the OR in satisfactory condition to postanesthesia care unit.

## 2011-11-05 NOTE — Anesthesia Postprocedure Evaluation (Signed)
Anesthesia Post Note  Patient: Alejandra Cook  Procedure(s) Performed: Procedure(s) (LRB): CYSTOSCOPY WITH RETROGRADE PYELOGRAM, URETEROSCOPY AND STENT PLACEMENT (Left)  Anesthesia type: General  Patient location: PACU  Post pain: Pain level controlled  Post assessment: Post-op Vital signs reviewed  Last Vitals:  Filed Vitals:   11/05/11 1945  BP: 119/62  Pulse: 106  Temp: 37.1 C  Resp: 16    Post vital signs: Reviewed  Level of consciousness: sedated  Complications: No apparent anesthesia complications

## 2011-11-06 ENCOUNTER — Other Ambulatory Visit: Payer: Self-pay | Admitting: Urology

## 2011-11-07 ENCOUNTER — Encounter (HOSPITAL_COMMUNITY): Payer: Self-pay | Admitting: *Deleted

## 2011-11-07 NOTE — Progress Notes (Signed)
Spoke to pt via phone  Informed to arrive at [facility] 04 04 2013, 0530  NPO AFTER MN  ,Bring completed blue folder,  Ins card,picture ID, Chiropractor, DO NOT TAKE ANY ASPIRIN OR IBURPOFEN PRODUCTS FOR within 72 hours of procedure  NO vitamins or herbal meds x 7 days    Follow laxative instructions on blue folder Pt verbalizes understanding  She is going to md office today to pick up blue folder  Instructed to call md for further instructions if she has any questions not covered in this phone call

## 2011-11-07 NOTE — H&P (Signed)
History of Present Illness  Alejandra Cook was seen in the ER at [facility] with a history of severe left flank pain associated with nausea and vomiting.  She had seen blood in her urine 3 days ago and went to a walk-in clinic.  She was started on antibiotics for presumed UTI.  The pain was severe yesterday and she went to the ER.  CT scan showed a 5 mm stone at the left UPJ with mild hydronephrosis.  She is still in severe pain and is unable to keep any food or liquids down.  She was given Toradol yesterday in the ER.  She denies previous history of kidney stone.  Urinalysis shows 0-3 RBC's, rare bacteria. She had a JJ stent inserted on 4/1.  She is now scheduled for ESL of the UPJ calculus.  Current Meds 1. Cyclobenzaprine HCl 10 MG Oral Tablet; Therapy: (Recorded:01Apr2013) to 2. Flomax 0.4 MG Oral Capsule; Therapy: (Recorded:01Apr2013) to 3. Norco 5-325 MG Oral Tablet; Therapy: (Recorded:01Apr2013) to 4. Topamax 200 MG Oral Tablet; Therapy: (Recorded:01Apr2013) to 5. Zofran 4 MG Oral Tablet; Therapy: (Recorded:01Apr2013) to 6. Zoloft 50 MG Oral Tablet; Therapy: (Recorded:01Apr2013) to  Allergies Medication  1. No Known Drug Allergies  Family History Problems  1. Family history of  Family Health Status - Father's Age 15 2. Family history of  Family Health Status - Mother's Age 55 3. Family history of  Family Health Status Number Of Children 1 daughter  Social History Problems    Marital History - Currently Married   Never A Smoker   Occupation: Runner, broadcasting/film/video Denied    History of  Alcohol Use   History of  Caffeine Use  Review of Systems Genitourinary, constitutional, skin, eye, otolaryngeal, hematologic/lymphatic, cardiovascular, pulmonary, endocrine, musculoskeletal, gastrointestinal, neurological and psychiatric system(s) were reviewed and pertinent findings if present are noted.  Genitourinary: hematuria.  Gastrointestinal: nausea, vomiting and abdominal pain.    Constitutional: feeling tired (fatigue).  Musculoskeletal: back pain.    Vitals Vital Signs [Data Includes: Last 1 Day]  01Apr2013 01:51PM  BMI Calculated: 21.44 BSA Calculated: 1.56 Height: 5 ft 3 in Weight: 121 lb  Blood Pressure: 109 / 71 Temperature: 98.7 F Heart Rate: 68 Respiration: 18  Physical Exam Constitutional: Well nourished and well developed . No acute distress.  ENT:. The ears and nose are normal in appearance.  Neck: The appearance of the neck is normal and no neck mass is present.  Pulmonary: No respiratory distress and normal respiratory rhythm and effort.  Cardiovascular: Heart rate and rhythm are normal . No peripheral edema.  Abdomen: The abdomen is flat. The abdomen is soft and nontender. No masses are palpated. severe left CVA tenderness no CVA tenderness. No hernias are palpable. No hepatosplenomegaly noted.  Genitourinary: Examination of the external genitalia shows normal female external genitalia and no lesions. The urethra is normal in appearance and not tender. There is no urethral mass. Vaginal exam demonstrates no abnormalities. The adnexa are palpably normal. The bladder is non tender and not distended. The anus is normal on inspection. The perineum is normal on inspection.  Lymphatics: The femoral and inguinal nodes are not enlarged or tender.  Skin: Normal skin turgor, no visible rash and no visible skin lesions.  Neuro/Psych:. Mood and affect are appropriate.    Results/Data Urine [Data Includes: Last 1 Day]   01Apr2013  COLOR YELLOW   APPEARANCE CLEAR   SPECIFIC GRAVITY 1.020   pH 6.5   GLUCOSE NEG mg/dL  BILIRUBIN NEG  KETONE NEG mg/dL  BLOOD TRACE   PROTEIN NEG mg/dL  UROBILINOGEN 0.2 mg/dL  NITRITE NEG   LEUKOCYTE ESTERASE NEG   SQUAMOUS EPITHELIAL/HPF FEW   WBC NONE SEEN WBC/hpf  RBC 0-3 RBC/hpf  BACTERIA RARE   CRYSTALS NONE SEEN   CASTS NONE SEEN     I independently reviewed the CT scan with the patient and her husband and  the findings are as noted above.   Assessment Assessed  1. Hydronephrosis 591 2. Proximal Ureteral Stone On The Left 592.1  Plan Health Maintenance (V70.0)  1. UA With REFLEX  Done: 01Apr2013 01:27PM   Cystoscopy, left retrograde pyelogram JJ stent placement.  She will need ESL at a later date.  The procedure, risks, benefits were explained to the patient and her husband.  The risks of ESL include but are not limited to hemorrhage, renal or perirenal hematoma, steinstrasse, inability to fragment the stone.  The risks of stent placement include injury to the ureter, inability to insert the stent.  They understand and wish to proceed.   Signatures Electronically signed by : Su Grand, M.D.; Nov 05 2011  2:53PM

## 2011-11-08 ENCOUNTER — Encounter (HOSPITAL_COMMUNITY)
Admission: RE | Disposition: A | Discharge status: Home / Self Care | Payer: Self-pay | Source: Ambulatory Visit | Attending: Urology

## 2011-11-08 ENCOUNTER — Ambulatory Visit (HOSPITAL_COMMUNITY)
Admission: RE | Admit: 2011-11-08 | Discharge: 2011-11-08 | Disposition: A | Discharge status: Home / Self Care | Payer: BC Managed Care – PPO | Source: Ambulatory Visit | Attending: Urology | Admitting: Urology

## 2011-11-08 ENCOUNTER — Ambulatory Visit (HOSPITAL_COMMUNITY): Payer: BC Managed Care – PPO

## 2011-11-08 DIAGNOSIS — N200 Calculus of kidney: Secondary | ICD-10-CM | POA: Insufficient documentation

## 2011-11-08 SURGERY — LITHOTRIPSY, ESWL
Anesthesia: LOCAL | Laterality: Left

## 2011-11-08 MED ORDER — DIAZEPAM 5 MG PO TABS
10.0000 mg | ORAL_TABLET | Freq: Once | ORAL | Status: AC
  Administered 2011-11-08: 10 mg via ORAL
  Filled 2011-11-08: qty 2

## 2011-11-08 MED ORDER — DIPHENHYDRAMINE HCL 25 MG PO TABS
25.0000 mg | ORAL_TABLET | Freq: Once | ORAL | Status: AC
  Administered 2011-11-08: 25 mg via ORAL
  Filled 2011-11-08: qty 1

## 2011-11-08 MED ORDER — DEXTROSE-NACL 5-0.45 % IV SOLN
INTRAVENOUS | Status: DC
  Administered 2011-11-08 (×2): 1000 mL via INTRAVENOUS

## 2011-11-08 MED ORDER — CIPROFLOXACIN HCL 500 MG PO TABS
500.0000 mg | ORAL_TABLET | Freq: Once | ORAL | Status: AC
  Administered 2011-11-08: 500 mg via ORAL
  Filled 2011-11-08: qty 1

## 2011-11-08 MED ORDER — DIAZEPAM 5 MG/ML IJ SOLN: 10.0000 mg | Freq: Once | INTRAMUSCULAR | Status: DC

## 2011-11-08 NOTE — Discharge Instructions (Signed)
Lithotripsy for Kidney Stones  WHAT ARE KIDNEY STONES?  The kidneys filter blood for chemicals the body cannot use. These waste chemicals are eliminated in the urine. They are removed from the body. Under some conditions, these chemicals may become concentrated. When this happens, they form crystals in the urine. When these crystals build up and stick together, stones may form. When these stones block the flow of urine through the urinary tract, they may cause severe pain. The urinary tract is very sensitive to blockage and stretching by the stone.  WHAT IS LITHOTRIPSY?  Lithotripsy is a treatment that can sometimes help eliminate kidney stones and pain faster. A form of lithotripsy, also known as ESWL (extracorporeal shock wave lithotripsy), is a nonsurgical procedure that helps your body rid itself of the kidney stone with a minimum amount of pain. EWSL is a method of crushing a kidney stone with shock waves. These shock waves pass through your body. They cause the kidney stones to crumble while still in the urinary tract. It is then easier for the smaller pieces of stone to pass in the urine.  Lithotripsy usually takes about an hour. It is done in a hospital, a lithotripsy center, or a mobile unit. It usually does not require an overnight stay. Your caregiver will instruct you on preparation for the procedure. Your caregiver will tell you what to expect afterward.  LET YOUR CAREGIVER KNOW ABOUT:   Allergies.    Medicines taken including herbs, eye drops, over the counter medicines (including aspirin, aleve, or motrin for treatment of inflammatory conditions) and creams.    Use of steroids (by mouth or creams).    Previous problems with anesthetics or novocaine.    Possibility of pregnancy, if this applies.    History of blood clots (thrombophlebitis).    History of bleeding or blood problems.    Previous surgery.    Other health problems.   RISKS AND COMPLICATIONS   Complications of lithotripsy are uncommon, but include the following:   Infection.    Bleeding of the kidney.    Bruising of the kidney or skin.    Obstruction of the ureter (the passageway from the kidney to the bladder).    Failure of the stone to fragment (break apart).   PROCEDURE  A stent (flexible tube with holes) may be placed in your ureter. The ureter is the tube that transports the urine from the kidneys to the bladder. Your caregiver may place a stent before the procedure. This will help keep urine flowing from the kidney if the fragments of the stone block the ureter. You may receive an intravenous (IV) line to give you fluids and medicines. These medicines may help you relax or make you sleep. During the procedure, you will lie comfortably on a fluid-filled cushion or in a warm-water bath. After an x-ray or ultrasound locates your stone, shock waves are aimed at the stone. If you are awake, you may feel a tapping sensation (feeling) as the shock waves pass through your body. If large stone particles remain after treatment, a second procedure may be necessary at a later date.  For comfort during the test:   Relax as much as possible.    Try to remain still as much as possible.    Try to follow instructions to speed up the test.    Let your caregiver know if you are uncomfortable, anxious, or in pain.   AFTER THE PROCEDURE    After surgery, you will be   taken to the recovery area. A nurse will watch and check your progress. Once you're awake, stable, and taking fluids well, you will be allowed to go home as long as there are no problems. You may be prescribed antibiotics (medicines that kill germs) to help prevent infection. You may also be prescribed pain medicine if needed. In a week or two, your doctor may remove your stent, if you have one. Your caregiver will check to see whether or not stone particles remain.  PASSING THE STONE   It may take anywhere from a day to several weeks for the stone particles to leave your body. During this time, drink at least 8 to 12 eight ounce glasses of water every day. It is normal for your urine to be cloudy or slightly bloody for a few weeks following this procedure. You may even see small pieces of stone in your urine. A slight fever and some pain are also normal. Your caregiver may ask you to strain your urine to collect some stone particles for chemical analysis. If you find particles while straining the urine, save them. Analysis tells you and the caregiver what the stone is made of. Knowing this may help prevent future stones.  PREVENTING FUTURE STONES   Drink about 8 to 12, eight-ounce glasses of water every day.    Follow the diet your caregiver recommends.    Take your prescribed medicine.    See your caregiver regularly for checkups.   SEEK IMMEDIATE MEDICAL CARE IF:   You develop an oral temperature above 102 F (38.9 C), or as your caregiver suggests.    Your pain is not relieved by medicine.    You develop nausea (feeling sick to your stomach) and vomiting.    You develop heavy bleeding.    You have difficulty urinating.   Document Released: 07/20/2000 Document Revised: 07/12/2011 Document Reviewed: 05/14/2008  Exit[employer] Patient Information 2012 [employer].

## 2011-11-09 ENCOUNTER — Encounter (HOSPITAL_COMMUNITY): Payer: Self-pay

## 2012-01-17 ENCOUNTER — Ambulatory Visit (INDEPENDENT_AMBULATORY_CARE_PROVIDER_SITE_OTHER): Payer: BC Managed Care – PPO | Admitting: Internal Medicine

## 2012-01-17 ENCOUNTER — Encounter: Payer: Self-pay | Admitting: Internal Medicine

## 2012-01-17 VITALS — BP 110/80 | HR 98 | Temp 98.8°F

## 2012-01-17 DIAGNOSIS — IMO0002 Reserved for concepts with insufficient information to code with codable children: Secondary | ICD-10-CM | POA: Insufficient documentation

## 2012-01-17 DIAGNOSIS — M797 Fibromyalgia: Secondary | ICD-10-CM

## 2012-01-17 DIAGNOSIS — J309 Allergic rhinitis, unspecified: Secondary | ICD-10-CM | POA: Insufficient documentation

## 2012-01-17 DIAGNOSIS — IMO0001 Reserved for inherently not codable concepts without codable children: Secondary | ICD-10-CM

## 2012-01-17 DIAGNOSIS — G43909 Migraine, unspecified, not intractable, without status migrainosus: Secondary | ICD-10-CM

## 2012-01-17 DIAGNOSIS — N898 Other specified noninflammatory disorders of vagina: Secondary | ICD-10-CM

## 2012-01-17 DIAGNOSIS — N9489 Other specified conditions associated with female genital organs and menstrual cycle: Secondary | ICD-10-CM

## 2012-01-17 MED ORDER — FLUTICASONE PROPIONATE 50 MCG/ACT NA SUSP: 2.0000 | Freq: Every day | NASAL | Status: DC

## 2012-01-17 NOTE — Assessment & Plan Note (Signed)
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 Will check LH and FSH with labs. Suspicion of this is low. Suspected increased stress related to work and ongoing issues with pain of may be contributing to decreased libido. Patient also has followup with her OB/GYN next month.

## 2012-01-17 NOTE — Assessment & Plan Note (Signed)
Symptoms currently well-controlled with use of intermittent Flexeril. Will continue. Will request notes on evaluation and management from her rheumatologist.

## 2012-01-17 NOTE — Progress Notes (Signed)
Subjective:    Patient ID: Alejandra Cook, female    DOB: August 20, 1980, 31 y.o.   MRN: [id number]  HPI 31 year old female with history of allergic rhinitis, migraine headaches, and fibromyalgia presents to establish care. She was previously seen by another physician in our practice. She reports that she is generally doing well. In regards to her migraine headaches, she reports very few headaches with the use of Topamax. Only on rare occasion does she need to take medication for breakthrough headache.  In regards to her fibromyalgia, she reports that symptoms are fairly well-controlled with the use of Flexeril. She notes that her fibromyalgia first began after her pregnancy. She is followed by a local rheumatologist.  She is concerned today about some decrease in libido and pain during intercourse. She notes some vaginal dryness, but no vaginal bleeding or discharge. She questions whether pain may be related to fibromyalgia. She notes that she is planning to become pregnant again within the next couple of years.  Outpatient Encounter Prescriptions as of 01/17/2012  Medication Sig Dispense Refill  . Cholecalciferol (VITAMIN D) 400 UNITS capsule Take 400 Units by mouth 2 (two) times daily.      . Coenzyme Q10 (CO Q 10 PO) Take 50 mg by mouth daily.      . Magnesium 65 MG TABS Take 1 tablet by mouth 3 (three) times daily.      . sertraline (ZOLOFT) 50 MG tablet Take 50 mg by mouth daily.      Marland Kitchen topiramate (TOPAMAX) 200 MG tablet Take 200 mg by mouth 2 (two) times daily.      Marland Kitchen DISCONTD: Tamsulosin HCl (FLOMAX) 0.4 MG CAPS Take 0.4 mg by mouth daily. Pt to take for 10 days. Pt is on day 9 therapy      . cyclobenzaprine (FLEXERIL) 10 MG tablet       . fluticasone (FLONASE) 50 MCG/ACT nasal spray Place 2 sprays into the nose daily.  16 g  6    Review of Systems  Constitutional: Negative for fever, chills, appetite change, fatigue and unexpected weight change.  HENT: Negative for ear pain, congestion,  sore throat, trouble swallowing, neck pain, voice change and sinus pressure.   Eyes: Negative for visual disturbance.  Respiratory: Negative for cough, shortness of breath, wheezing and stridor.   Cardiovascular: Negative for chest pain, palpitations and leg swelling.  Gastrointestinal: Negative for nausea, vomiting, abdominal pain, diarrhea, constipation, blood in stool, abdominal distention and anal bleeding.  Genitourinary: Positive for dyspareunia. Negative for dysuria and flank pain.  Musculoskeletal: Negative for myalgias, arthralgias and gait problem.  Skin: Negative for color change and rash.  Neurological: Negative for dizziness and headaches.  Hematological: Negative for adenopathy. Does not bruise/bleed easily.  Psychiatric/Behavioral: Negative for suicidal ideas, disturbed wake/sleep cycle and dysphoric mood. The patient is not nervous/anxious.    BP 110/80  Pulse 98  Temp 98.8 F (37.1 C) (Oral)  SpO2 99%  LMP 01/06/2012     Objective:   Physical Exam  Constitutional: She is oriented to person, place, and time. She appears well-developed and well-nourished. No distress.  HENT:  Head: Normocephalic and atraumatic.  Right Ear: External ear normal.  Left Ear: External ear normal.  Nose: Nose normal.  Mouth/Throat: Oropharynx is clear and moist. No oropharyngeal exudate.  Eyes: Conjunctivae are normal. Pupils are equal, round, and reactive to light. Right eye exhibits no discharge. Left eye exhibits no discharge. No scleral icterus.  Neck: Normal range of motion. Neck supple.  No tracheal deviation present. No thyromegaly present.  Cardiovascular: Normal rate, regular rhythm, normal heart sounds and intact distal pulses.  Exam reveals no gallop and no friction rub.   No murmur heard. Pulmonary/Chest: Effort normal and breath sounds normal. No respiratory distress. She has no wheezes. She has no rales. She exhibits no tenderness.  Abdominal: Soft. Bowel sounds are normal. She  exhibits no distension and no mass. There is no tenderness. There is no guarding.  Musculoskeletal: Normal range of motion. She exhibits no edema and no tenderness.  Lymphadenopathy:    She has no cervical adenopathy.  Neurological: She is alert and oriented to person, place, and time. No cranial nerve deficit. She exhibits normal muscle tone. Coordination normal.  Skin: Skin is warm and dry. No rash noted. She is not diaphoretic. No erythema. No pallor.  Psychiatric: She has a normal mood and affect. Her behavior is normal. Judgment and thought content normal.          Assessment & Plan:

## 2012-01-17 NOTE — Assessment & Plan Note (Signed)
Symptoms currently. Well controlled with Topamax alone. Will continue.

## 2012-01-18 LAB — LUTEINIZING HORMONE: LH: 12.87 m[IU]/mL

## 2012-01-18 LAB — FOLLICLE STIMULATING HORMONE: FSH: 6.6 m[IU]/mL

## 2012-01-22 LAB — HM PAP SMEAR: HM Pap smear: NORMAL

## 2012-06-05 ENCOUNTER — Telehealth: Payer: Self-pay | Admitting: Internal Medicine

## 2012-06-05 NOTE — Telephone Encounter (Signed)
No appts available with Dr. Dan Humphreys on 06/06/2012.

## 2012-06-05 NOTE — Telephone Encounter (Signed)
Pt wanted make appointment with dr walker about rash on arm.  Pt went to Dr Eliberto Ivory PA  yesterday he stated he didn't think is was ring worm.  Dr  Eliberto Ivory Pa wanted her to see her primary care asap about this. Pt has appointment with dr Darrick Huntsman 11/1 but would perfer to see dr walker

## 2012-06-06 ENCOUNTER — Ambulatory Visit: Payer: BC Managed Care – PPO | Admitting: Internal Medicine

## 2012-06-09 ENCOUNTER — Ambulatory Visit (INDEPENDENT_AMBULATORY_CARE_PROVIDER_SITE_OTHER): Payer: BC Managed Care – PPO | Admitting: Internal Medicine

## 2012-06-09 ENCOUNTER — Encounter: Payer: Self-pay | Admitting: Internal Medicine

## 2012-06-09 VITALS — BP 92/60 | HR 84 | Temp 98.5°F | Ht 63.0 in | Wt 123.0 lb

## 2012-06-09 DIAGNOSIS — J0100 Acute maxillary sinusitis, unspecified: Secondary | ICD-10-CM

## 2012-06-09 DIAGNOSIS — L309 Dermatitis, unspecified: Secondary | ICD-10-CM

## 2012-06-09 DIAGNOSIS — R233 Spontaneous ecchymoses: Secondary | ICD-10-CM

## 2012-06-09 DIAGNOSIS — L259 Unspecified contact dermatitis, unspecified cause: Secondary | ICD-10-CM

## 2012-06-09 LAB — CBC WITH DIFFERENTIAL/PLATELET
Basophils Absolute: 0.1 10*3/uL (ref 0.0–0.1)
Eosinophils Absolute: 0.1 10*3/uL (ref 0.0–0.7)
Lymphocytes Relative: 33 % (ref 12.0–46.0)
MCHC: 33.7 g/dL (ref 30.0–36.0)
MCV: 96.4 fl (ref 78.0–100.0)
Monocytes Absolute: 0.5 10*3/uL (ref 0.1–1.0)
Neutrophils Relative %: 58.8 % (ref 43.0–77.0)
RDW: 12.8 % (ref 11.5–14.6)

## 2012-06-09 LAB — COMPREHENSIVE METABOLIC PANEL
ALT: 11 U/L (ref 0–35)
AST: 17 U/L (ref 0–37)
Albumin: 4.7 g/dL (ref 3.5–5.2)
Alkaline Phosphatase: 53 U/L (ref 39–117)
Calcium: 9.5 mg/dL (ref 8.4–10.5)
Chloride: 105 mEq/L (ref 96–112)
Potassium: 3.8 mEq/L (ref 3.5–5.1)
Sodium: 139 mEq/L (ref 135–145)
Total Protein: 7.6 g/dL (ref 6.0–8.3)

## 2012-06-09 LAB — PROTIME-INR
INR: 1.1 ratio — ABNORMAL HIGH (ref 0.8–1.0)
Prothrombin Time: 11.1 s (ref 10.2–12.4)

## 2012-06-09 MED ORDER — AMOXICILLIN-POT CLAVULANATE 875-125 MG PO TABS: 1.0000 | ORAL_TABLET | Freq: Two times a day (BID) | ORAL | Status: DC

## 2012-06-09 MED ORDER — TRIAMCINOLONE ACETONIDE 0.1 % EX CREA: TOPICAL_CREAM | Freq: Two times a day (BID) | CUTANEOUS | Status: DC

## 2012-06-09 NOTE — Assessment & Plan Note (Signed)
Spontaneous bruising over her left arm. No other bleeding noted. Lab work today including CBC, PT, and PTT were normal. Patient reports workup in the past by hematology for bleeding disorder. Will try to obtain records on this. We'll continue to monitor.

## 2012-06-09 NOTE — Assessment & Plan Note (Signed)
Patient with rash over her left forearm most consistent with contact dermatitis at the site of her wedding ring and a bandaid on her medial forearm. Question if she may have nickel and/or latex sensitivity. Will start topical triamcinolone bid and set up derm evaluation.

## 2012-06-09 NOTE — Progress Notes (Signed)
Subjective:    Patient ID: Alejandra Cook, female    DOB: November 26, 1980, 31 y.o.   MRN: [id number]  HPI 31 year old female presents for acute visit complaining of one-week history of rash on her left forearm and bruising over her left forearm. She reports that rash began as an erythematous circle on her medial left forearm. She was seen by her rheumatologist and diagnosed as having possible ringworm. She started to apply topical Lotrimin cream but had no improvement in her symptoms. She had been made over the site and reports that the Band-Aid led to significant skin irritation with erythema and itching as well as bruising at the site. She also noted some redness and itching underneath her wedding ring on her left fourth finger. This is the first time that her wedding ring has caused irritation. She denies any exposure to new detergents, soaps, lotions, perfumes. She denies any other bleeding or bruising. She denies any new medications.  Outpatient Encounter Prescriptions as of 06/09/2012  Medication Sig Dispense Refill  . Cholecalciferol (VITAMIN D) 400 UNITS capsule Take 400 Units by mouth 2 (two) times daily.      . Coenzyme Q10 (CO Q 10 PO) Take 50 mg by mouth daily.      . cyclobenzaprine (FLEXERIL) 10 MG tablet       . fluticasone (FLONASE) 50 MCG/ACT nasal spray Place 2 sprays into the nose daily.  16 g  6  . Magnesium 65 MG TABS Take 1 tablet by mouth 3 (three) times daily.      . sertraline (ZOLOFT) 50 MG tablet Take 50 mg by mouth daily.      Marland Kitchen topiramate (TOPAMAX) 200 MG tablet Take 200 mg by mouth daily.       Marland Kitchen amoxicillin-clavulanate (AUGMENTIN) 875-125 MG per tablet Take 1 tablet by mouth 2 (two) times daily.  20 tablet  0  . triamcinolone cream (KENALOG) 0.1 % Apply topically 2 (two) times daily.  30 g  0   BP 92/60  Pulse 84  Temp 98.5 F (36.9 C) (Oral)  Ht 5\' 3"  (1.6 m)  Wt 123 lb (55.792 kg)  BMI 21.79 kg/m2  SpO2 98%  LMP 05/28/2012  Review of Systems  Constitutional:  Negative for fever, chills, appetite change, fatigue and unexpected weight change.  HENT: Negative for ear pain, congestion, sore throat, trouble swallowing, neck pain, voice change and sinus pressure.   Eyes: Negative for visual disturbance.  Respiratory: Negative for cough, shortness of breath, wheezing and stridor.   Cardiovascular: Negative for chest pain, palpitations and leg swelling.  Gastrointestinal: Negative for nausea, vomiting, abdominal pain, diarrhea, constipation, blood in stool, abdominal distention and anal bleeding.  Genitourinary: Negative for dysuria and flank pain.  Musculoskeletal: Negative for myalgias, arthralgias and gait problem.  Skin: Positive for color change, rash and wound.  Neurological: Negative for dizziness and headaches.  Hematological: Negative for adenopathy. Bruises/bleeds easily.  Psychiatric/Behavioral: Negative for suicidal ideas, sleep disturbance and dysphoric mood. The patient is not nervous/anxious.        Objective:   Physical Exam  Constitutional: She is oriented to person, place, and time. She appears well-developed and well-nourished. No distress.  HENT:  Head: Normocephalic and atraumatic.  Right Ear: External ear normal. A middle ear effusion is present.  Left Ear: External ear normal. A middle ear effusion is present.  Nose: Mucosal edema present. Right sinus exhibits maxillary sinus tenderness. Left sinus exhibits maxillary sinus tenderness.  Mouth/Throat: Oropharynx is clear and moist. No  oropharyngeal exudate.  Eyes: Conjunctivae normal are normal. Pupils are equal, round, and reactive to light. Right eye exhibits no discharge. Left eye exhibits no discharge. No scleral icterus.  Neck: Normal range of motion. Neck supple. No tracheal deviation present. No thyromegaly present.  Cardiovascular: Normal rate, regular rhythm, normal heart sounds and intact distal pulses.  Exam reveals no gallop and no friction rub.   No murmur  heard. Pulmonary/Chest: Effort normal and breath sounds normal. No respiratory distress. She has no wheezes. She has no rales. She exhibits no tenderness.  Musculoskeletal: Normal range of motion. She exhibits no edema and no tenderness.  Lymphadenopathy:    She has no cervical adenopathy.  Neurological: She is alert and oriented to person, place, and time. No cranial nerve deficit. She exhibits normal muscle tone. Coordination normal.  Skin: Skin is warm and dry. Rash noted. She is not diaphoretic. There is erythema. No pallor.     Psychiatric: She has a normal mood and affect. Her behavior is normal. Judgment and thought content normal.          Assessment & Plan:

## 2012-06-09 NOTE — Assessment & Plan Note (Signed)
Symptoms are consistent with acute maxillary sinusitis. Will treat with Augmentin x10 days. Patient will call if symptoms are not improving. Followup one month for recheck.

## 2012-06-10 ENCOUNTER — Ambulatory Visit: Payer: BC Managed Care – PPO | Admitting: Family Medicine

## 2012-06-23 NOTE — Progress Notes (Signed)
Called and gave lab results to patient. Patient stated that she  had already seen her dermatologist.  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

## 2012-08-11 ENCOUNTER — Encounter: Payer: Self-pay | Admitting: Adult Health

## 2012-08-11 ENCOUNTER — Ambulatory Visit (INDEPENDENT_AMBULATORY_CARE_PROVIDER_SITE_OTHER): Payer: BC Managed Care – PPO | Admitting: Adult Health

## 2012-08-11 VITALS — BP 96/65 | HR 103 | Temp 98.7°F | Resp 14 | Ht 64.0 in | Wt 119.0 lb

## 2012-08-11 DIAGNOSIS — J111 Influenza due to unidentified influenza virus with other respiratory manifestations: Secondary | ICD-10-CM

## 2012-08-11 DIAGNOSIS — J0100 Acute maxillary sinusitis, unspecified: Secondary | ICD-10-CM

## 2012-08-11 LAB — POCT INFLUENZA A/B: Influenza A, POC: NEGATIVE

## 2012-08-11 MED ORDER — AMOXICILLIN-POT CLAVULANATE 875-125 MG PO TABS: 1.0000 | ORAL_TABLET | Freq: Two times a day (BID) | ORAL | Status: DC

## 2012-08-11 NOTE — Patient Instructions (Addendum)
  Start your antibiotic today.  You can take tylenol to keep your fever down.  Continue to use your flonase and flush your sinuses.  Please call if your symptoms do not improve within 3-4 days.

## 2012-08-11 NOTE — Progress Notes (Signed)
  Subjective:    Patient ID: Alejandra Cook, female    DOB: [birth year], 32 y.o.   MRN: [id number]  HPI  Patient is a pleasant 32 y/o female who presents to clinic today with c/o congestion that began ~ 7 days ago. She has been taking Mucinex multi symptom which also includes tylenol. She reports that she spent all weekend in bed because of not feeling well. Last night start low grade fever, chills. No coughing. General body aches and pains. Denies chest pain or shortness of breath.   Current Outpatient Prescriptions on File Prior to Visit  Medication Sig Dispense Refill  . Cholecalciferol (VITAMIN D) 400 UNITS capsule Take 400 Units by mouth 2 (two) times daily.      . Coenzyme Q10 (CO Q 10 PO) Take 50 mg by mouth daily.      . cyclobenzaprine (FLEXERIL) 10 MG tablet       . fluticasone (FLONASE) 50 MCG/ACT nasal spray Place 2 sprays into the nose daily.  16 g  6  . Magnesium 65 MG TABS Take 1 tablet by mouth 3 (three) times daily.      . sertraline (ZOLOFT) 50 MG tablet Take 50 mg by mouth daily.      Marland Kitchen topiramate (TOPAMAX) 200 MG tablet Take 200 mg by mouth daily. Patient is taking 250 mg daily         Review of Systems  Constitutional: Positive for fever, chills, appetite change and fatigue.  HENT: Positive for congestion, sore throat, rhinorrhea, postnasal drip and sinus pressure.   Respiratory: Negative for cough, shortness of breath and wheezing.   Cardiovascular: Negative for chest pain.  Gastrointestinal: Positive for nausea and diarrhea.  Genitourinary: Negative.   Neurological: Negative for dizziness, light-headedness and headaches.  Psychiatric/Behavioral: Negative.     BP 96/65  Pulse 103  Temp 98.7 F (37.1 C) (Oral)  Resp 14  Ht 5\' 4"  (1.626 m)  Wt 119 lb (53.978 kg)  BMI 20.43 kg/m2  SpO2 99%  LMP 08/01/2012     Objective:   Physical Exam  Constitutional: She is oriented to person, place, and time. She appears well-developed and well-nourished. No distress.    HENT:  Head: Normocephalic and atraumatic.  Mouth/Throat: No oropharyngeal exudate.       Pharyngeal erythema  Eyes: Right eye exhibits no discharge. Left eye exhibits no discharge.  Neck: Normal range of motion. No tracheal deviation present.  Cardiovascular: Normal rate, regular rhythm and normal heart sounds.  Exam reveals no gallop and no friction rub.   No murmur heard. Pulmonary/Chest: Effort normal and breath sounds normal. She has no wheezes. She has no rales.  Abdominal: Soft. Bowel sounds are normal.  Musculoskeletal: Normal range of motion. She exhibits no edema.  Lymphadenopathy:    She has cervical adenopathy.  Neurological: She is alert and oriented to person, place, and time. No cranial nerve deficit.  Skin: Skin is warm and dry. No rash noted.  Psychiatric: She has a normal mood and affect. Her behavior is normal. Judgment and thought content normal.       Assessment & Plan:

## 2012-08-11 NOTE — Assessment & Plan Note (Signed)
Suspect acute sinusitis. Start Augmentin. Flush sinuses daily. Continue flonase. RTC if symptoms worsen or if no improvement within 3-4 days.

## 2012-08-19 ENCOUNTER — Telehealth: Payer: Self-pay | Admitting: *Deleted

## 2012-08-19 ENCOUNTER — Other Ambulatory Visit: Payer: Self-pay | Admitting: Adult Health

## 2012-08-19 MED ORDER — FLUCONAZOLE 150 MG PO TABS: 150.0000 mg | ORAL_TABLET | Freq: Once | ORAL | Status: DC

## 2012-08-19 NOTE — Progress Notes (Signed)
Sent in order for Diflucan to pharmacy. Patient recently started on antibiotic and now presents with yeast infection.

## 2012-08-19 NOTE — Telephone Encounter (Signed)
Diflucan sent into pharmacy

## 2012-08-19 NOTE — Telephone Encounter (Signed)
Patient called to request a Rx for Diflucan.  She was seen by Alejandra Cook on 08/11/2012 and was given an antibiotic and she thinks she has developed a yeast infection.  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  Uses [facility].

## 2012-08-19 NOTE — Telephone Encounter (Signed)
Patient advised as instructed via telephone. 

## 2012-09-20 ENCOUNTER — Other Ambulatory Visit: Payer: Self-pay

## 2012-11-26 ENCOUNTER — Encounter: Payer: Self-pay | Admitting: Internal Medicine

## 2012-11-26 ENCOUNTER — Ambulatory Visit (INDEPENDENT_AMBULATORY_CARE_PROVIDER_SITE_OTHER): Payer: BC Managed Care – PPO | Admitting: Internal Medicine

## 2012-11-26 VITALS — BP 96/60 | HR 92 | Temp 98.3°F | Wt 121.0 lb

## 2012-11-26 DIAGNOSIS — J0100 Acute maxillary sinusitis, unspecified: Secondary | ICD-10-CM

## 2012-11-26 MED ORDER — AMOXICILLIN-POT CLAVULANATE 875-125 MG PO TABS: 1.0000 | ORAL_TABLET | Freq: Two times a day (BID) | ORAL | Status: DC

## 2012-11-26 MED ORDER — FLUCONAZOLE 150 MG PO TABS: 150.0000 mg | ORAL_TABLET | Freq: Once | ORAL | Status: DC

## 2012-11-26 NOTE — Assessment & Plan Note (Signed)
Symptoms and exam are consistent with acute maxillary sinusitis. Will treat with Augmentin. Will use ibuprofen as needed for pain. We discussed potentially adding prednisone if no improvement. Given her history of sinus surgery, we also discussed repeat evaluation with ENT if symptoms are persistent. Patient will e-mail with an update in 48 hours.

## 2012-11-26 NOTE — Progress Notes (Signed)
Subjective:    Patient ID: Alejandra Cook, female    DOB: 11-26-1980, 32 y.o.   MRN: [id number]  HPI 32 year old female with history of seasonal allergies and recurrent sinusitis presents for acute visit. She reports over the last 3 weeks she has had persistent nasal drainage, sneezing, watery eyes consistent with her history of seasonal allergies. Over the last one to 2 weeks, she has developed bilateral sinus pressure and pain, headache, purulent nasal drainage, and fatigue. She denies fever or chills. She has been using over-the-counter antihistamines and Flonase with no improvement.  Outpatient Encounter Prescriptions as of 11/26/2012  Medication Sig Dispense Refill  . Cholecalciferol (VITAMIN D) 400 UNITS capsule Take 400 Units by mouth 2 (two) times daily.      . Coenzyme Q10 (CO Q 10 PO) Take 50 mg by mouth daily.      . cyclobenzaprine (FLEXERIL) 10 MG tablet       . fluticasone (FLONASE) 50 MCG/ACT nasal spray Place 2 sprays into the nose daily.  16 g  6  . Magnesium 65 MG TABS Take 1 tablet by mouth 3 (three) times daily.      . sertraline (ZOLOFT) 50 MG tablet Take 50 mg by mouth daily.      Marland Kitchen topiramate (TOPAMAX) 200 MG tablet Take 200 mg by mouth daily. Patient is taking 250 mg daily      . amoxicillin-clavulanate (AUGMENTIN) 875-125 MG per tablet Take 1 tablet by mouth 2 (two) times daily.  20 tablet  0  . fluconazole (DIFLUCAN) 150 MG tablet Take 1 tablet (150 mg total) by mouth once.  1 tablet  0   No facility-administered encounter medications on file as of 11/26/2012.   BP 96/60  Pulse 92  Temp(Src) 98.3 F (36.8 C) (Oral)  Wt 121 lb (54.885 kg)  BMI 20.76 kg/m2  SpO2 97%  LMP 11/23/2012  Review of Systems  Constitutional: Positive for fatigue. Negative for fever, chills, appetite change and unexpected weight change.  HENT: Positive for congestion and sinus pressure. Negative for ear pain, sore throat, trouble swallowing, neck pain and voice change.   Eyes: Negative  for visual disturbance.  Respiratory: Negative for cough, shortness of breath, wheezing and stridor.   Cardiovascular: Negative for chest pain and palpitations.  Gastrointestinal: Negative for abdominal pain.  Genitourinary: Negative for dysuria and flank pain.  Musculoskeletal: Negative for myalgias, arthralgias and gait problem.  Skin: Negative for color change and rash.  Neurological: Positive for headaches. Negative for dizziness.  Hematological: Negative for adenopathy. Does not bruise/bleed easily.  Psychiatric/Behavioral: Negative for suicidal ideas, sleep disturbance and dysphoric mood. The patient is not nervous/anxious.        Objective:   Physical Exam  Constitutional: She is oriented to person, place, and time. She appears well-developed and well-nourished. No distress.  HENT:  Head: Normocephalic and atraumatic.  Right Ear: External ear normal. A middle ear effusion is present.  Left Ear: External ear normal. A middle ear effusion is present.  Nose: Mucosal edema and sinus tenderness present. Right sinus exhibits maxillary sinus tenderness and frontal sinus tenderness. Left sinus exhibits maxillary sinus tenderness and frontal sinus tenderness.  Mouth/Throat: Posterior oropharyngeal erythema present. No oropharyngeal exudate.  Eyes: Conjunctivae are normal. Pupils are equal, round, and reactive to light. Right eye exhibits no discharge. Left eye exhibits no discharge. No scleral icterus.  Neck: Normal range of motion. Neck supple. No tracheal deviation present. No thyromegaly present.  Cardiovascular: Normal rate, regular rhythm, normal  heart sounds and intact distal pulses.  Exam reveals no gallop and no friction rub.   No murmur heard. Pulmonary/Chest: Effort normal and breath sounds normal. No respiratory distress. She has no wheezes. She has no rales. She exhibits no tenderness.  Musculoskeletal: Normal range of motion. She exhibits no edema and no tenderness.   Lymphadenopathy:    She has no cervical adenopathy.  Neurological: She is alert and oriented to person, place, and time. No cranial nerve deficit. She exhibits normal muscle tone. Coordination normal.  Skin: Skin is warm and dry. No rash noted. She is not diaphoretic. No erythema. No pallor.  Psychiatric: She has a normal mood and affect. Her behavior is normal. Judgment and thought content normal.          Assessment & Plan:

## 2012-11-28 ENCOUNTER — Encounter: Payer: Self-pay | Admitting: Internal Medicine

## 2012-11-28 MED ORDER — PREDNISONE (PAK) 10 MG PO TABS: ORAL_TABLET | ORAL | Status: DC

## 2012-12-03 ENCOUNTER — Encounter: Payer: Self-pay | Admitting: Internal Medicine

## 2012-12-05 ENCOUNTER — Encounter: Payer: Self-pay | Admitting: Internal Medicine

## 2012-12-23 ENCOUNTER — Ambulatory Visit (INDEPENDENT_AMBULATORY_CARE_PROVIDER_SITE_OTHER): Payer: BC Managed Care – PPO | Admitting: Adult Health

## 2012-12-23 ENCOUNTER — Encounter: Payer: Self-pay | Admitting: Adult Health

## 2012-12-23 VITALS — BP 102/62 | HR 99 | Resp 12 | Wt 126.5 lb

## 2012-12-23 DIAGNOSIS — L739 Follicular disorder, unspecified: Secondary | ICD-10-CM | POA: Insufficient documentation

## 2012-12-23 DIAGNOSIS — L678 Other hair color and hair shaft abnormalities: Secondary | ICD-10-CM

## 2012-12-23 DIAGNOSIS — L738 Other specified follicular disorders: Secondary | ICD-10-CM

## 2012-12-23 MED ORDER — CEPHALEXIN 750 MG PO CAPS: 750.0000 mg | ORAL_CAPSULE | Freq: Three times a day (TID) | ORAL | Status: DC

## 2012-12-23 NOTE — Progress Notes (Signed)
  Subjective:    Patient ID: Alejandra Cook, female    DOB: Nov 27, 1980, 32 y.o.   MRN: [id number]  HPI  Patient is a pleasant 32 y/o female who presents to clinic with a rash on bilateral upper extremities, neck and chest. She first noticed the rash on Thursday. She reports the onset was sudden. She denies being out in the woods or yard. She denies   Review of Systems  Constitutional: Negative.   Respiratory: Negative.   Cardiovascular: Negative.   Gastrointestinal: Negative.   Skin: Positive for rash.       Denies itching. Rash on upper extremities, neck and chest  Neurological: Negative.        Objective:   Physical Exam  Constitutional: She is oriented to person, place, and time.  Cardiovascular: Normal rate and regular rhythm.   Pulmonary/Chest: Effort normal. No respiratory distress.  Neurological: She is alert and oriented to person, place, and time.  Skin: Rash noted.  Tiny pustules and papules over bilateral upper extremities and upper chest.   Psychiatric: She has a normal mood and affect. Her behavior is normal. Judgment and thought content normal.          Assessment & Plan:

## 2012-12-23 NOTE — Assessment & Plan Note (Signed)
Keflex 750 mg tid x 7 days. If no improvement will refer to derm.

## 2012-12-24 ENCOUNTER — Other Ambulatory Visit: Payer: Self-pay | Admitting: Adult Health

## 2012-12-24 ENCOUNTER — Encounter: Payer: Self-pay | Admitting: Adult Health

## 2012-12-24 DIAGNOSIS — J0100 Acute maxillary sinusitis, unspecified: Secondary | ICD-10-CM

## 2012-12-24 MED ORDER — FLUCONAZOLE 150 MG PO TABS: 150.0000 mg | ORAL_TABLET | Freq: Once | ORAL | Status: DC

## 2013-01-21 ENCOUNTER — Encounter: Payer: Self-pay | Admitting: Internal Medicine

## 2013-01-21 ENCOUNTER — Ambulatory Visit (INDEPENDENT_AMBULATORY_CARE_PROVIDER_SITE_OTHER): Payer: BC Managed Care – PPO | Admitting: Internal Medicine

## 2013-01-21 VITALS — BP 104/68 | HR 92 | Temp 98.1°F | Ht 64.5 in | Wt 127.0 lb

## 2013-01-21 DIAGNOSIS — L259 Unspecified contact dermatitis, unspecified cause: Secondary | ICD-10-CM

## 2013-01-21 DIAGNOSIS — Z0000 Encounter for general adult medical examination without abnormal findings: Secondary | ICD-10-CM

## 2013-01-21 DIAGNOSIS — L309 Dermatitis, unspecified: Secondary | ICD-10-CM

## 2013-01-21 LAB — COMPREHENSIVE METABOLIC PANEL
ALT: 13 U/L (ref 0–35)
AST: 18 U/L (ref 0–37)
Albumin: 4.3 g/dL (ref 3.5–5.2)
Alkaline Phosphatase: 48 U/L (ref 39–117)
BUN: 12 mg/dL (ref 6–23)
CO2: 20 mEq/L (ref 19–32)
Calcium: 8.3 mg/dL — ABNORMAL LOW (ref 8.4–10.5)
Chloride: 107 mEq/L (ref 96–112)
Creatinine, Ser: 0.8 mg/dL (ref 0.4–1.2)
GFR: 95.23 mL/min (ref >60.00)
Glucose, Bld: 91 mg/dL (ref 70–99)
Potassium: 3.4 mEq/L — ABNORMAL LOW (ref 3.5–5.1)
Sodium: 137 mEq/L (ref 135–145)
Total Bilirubin: 0.6 mg/dL (ref 0.3–1.2)
Total Protein: 7 g/dL (ref 6.0–8.3)

## 2013-01-21 LAB — CBC WITH DIFFERENTIAL/PLATELET
Basophils Absolute: 0.1 10*3/uL (ref 0.0–0.1)
Basophils Relative: 1.1 % (ref 0.0–3.0)
Eosinophils Absolute: 0 10*3/uL (ref 0.0–0.7)
Eosinophils Relative: 0.7 % (ref 0.0–5.0)
HCT: 40.9 % (ref 36.0–46.0)
Hemoglobin: 13.9 g/dL (ref 12.0–15.0)
Lymphocytes Relative: 33.4 % (ref 12.0–46.0)
Lymphs Abs: 2 10*3/uL (ref 0.7–4.0)
MCHC: 34 g/dL (ref 30.0–36.0)
MCV: 96.3 fl (ref 78.0–100.0)
Monocytes Absolute: 0.4 10*3/uL (ref 0.1–1.0)
Monocytes Relative: 6.4 % (ref 3.0–12.0)
Neutro Abs: 3.5 10*3/uL (ref 1.4–7.7)
Neutrophils Relative %: 58.4 % (ref 43.0–77.0)
Platelets: 215 10*3/uL (ref 150.0–400.0)
RBC: 4.24 Mil/uL (ref 3.87–5.11)
RDW: 12.6 % (ref 11.5–14.6)
WBC: 6 10*3/uL (ref 4.5–10.5)

## 2013-01-21 LAB — TSH: TSH: 0.86 u[IU]/mL (ref 0.35–5.50)

## 2013-01-21 LAB — LIPID PANEL
Cholesterol: 151 mg/dL (ref 0–200)
HDL: 57.1 mg/dL (ref >39.00)
LDL Cholesterol: 81 mg/dL (ref 0–99)
Total CHOL/HDL Ratio: 3
Triglycerides: 67 mg/dL (ref 0.0–149.0)
VLDL: 13.4 mg/dL (ref 0.0–40.0)

## 2013-01-21 MED ORDER — CYCLOBENZAPRINE HCL 10 MG PO TABS: 10.0000 mg | ORAL_TABLET | Freq: Three times a day (TID) | ORAL | Status: DC | PRN

## 2013-01-21 NOTE — Progress Notes (Signed)
Subjective:    Patient ID: Alejandra Cook, female    DOB: 08-19-1980, 32 y.o.   MRN: [id number]  HPI 32 year old female with history of migraine headaches presents for annual exam. She reports she is generally feeling well. Her only concern is persistent rash over her arms. She was seen in our clinic in May for a rash over her chest and arms. She was diagnosed with folliculitis and treated with antibiotics. She reports that pustular rash over her chest and arms resolved but she now has brown bumps remaining over her arms. They are not itchy. They're not painful. She has not been applying any new lotions or creams. She does note some sun exposure in the area.  In regards to health maintenance, she reports that she recently underwent pelvic exam and Pap smear with her OB/GYN. She reports this was normal. She is up-to-date on immunizations. She continues to follow a healthy diet and exercise on a regular basis. She has never had a mammogram.  Outpatient Encounter Prescriptions as of 01/21/2013  Medication Sig Dispense Refill  . Cholecalciferol (VITAMIN D) 400 UNITS capsule Take 400 Units by mouth 2 (two) times daily.      . Coenzyme Q10 (CO Q 10 PO) Take 50 mg by mouth daily.      . cyclobenzaprine (FLEXERIL) 10 MG tablet Take 1 tablet (10 mg total) by mouth 3 (three) times daily as needed for muscle spasms.  90 tablet  6  . diclofenac (CATAFLAM) 50 MG tablet       . fluticasone (FLONASE) 50 MCG/ACT nasal spray Place 2 sprays into the nose daily.  16 g  6  . Magnesium 65 MG TABS Take 1 tablet by mouth 3 (three) times daily.      . sertraline (ZOLOFT) 50 MG tablet Take 50 mg by mouth daily.      Marland Kitchen topiramate (TOPAMAX) 200 MG tablet Take 200 mg by mouth daily. Patient is taking 250 mg daily       No facility-administered encounter medications on file as of 01/21/2013.   BP 104/68  Pulse 92  Temp(Src) 98.1 F (36.7 C) (Oral)  Ht 5' 4.5" (1.638 m)  Wt 127 lb (57.607 kg)  BMI 21.47 kg/m2  SpO2 98%   LMP 12/24/2012  Review of Systems  Constitutional: Negative for fever, chills, appetite change, fatigue and unexpected weight change.  HENT: Negative for ear pain, congestion, sore throat, trouble swallowing, neck pain, voice change and sinus pressure.   Eyes: Negative for visual disturbance.  Respiratory: Negative for cough, shortness of breath, wheezing and stridor.   Cardiovascular: Negative for chest pain, palpitations and leg swelling.  Gastrointestinal: Negative for nausea, vomiting, abdominal pain, diarrhea, constipation, blood in stool, abdominal distention and anal bleeding.  Genitourinary: Negative for dysuria and flank pain.  Musculoskeletal: Negative for myalgias, arthralgias and gait problem.  Skin: Negative for color change and rash.  Neurological: Negative for dizziness and headaches.  Hematological: Negative for adenopathy. Does not bruise/bleed easily.  Psychiatric/Behavioral: Negative for suicidal ideas, sleep disturbance and dysphoric mood. The patient is not nervous/anxious.        Objective:   Physical Exam  Constitutional: She is oriented to person, place, and time. She appears well-developed and well-nourished. No distress.  HENT:  Head: Normocephalic and atraumatic.  Right Ear: External ear normal.  Left Ear: External ear normal.  Nose: Nose normal.  Mouth/Throat: Oropharynx is clear and moist. No oropharyngeal exudate.  Eyes: Conjunctivae are normal. Pupils are equal, round,  and reactive to light. Right eye exhibits no discharge. Left eye exhibits no discharge. No scleral icterus.  Neck: Normal range of motion. Neck supple. No tracheal deviation present. No thyromegaly present.  Cardiovascular: Normal rate, regular rhythm, normal heart sounds and intact distal pulses.  Exam reveals no gallop and no friction rub.   No murmur heard. Pulmonary/Chest: Effort normal and breath sounds normal. No accessory muscle usage. Not tachypneic. No respiratory distress. She  has no decreased breath sounds. She has no wheezes. She has no rhonchi. She has no rales. She exhibits no tenderness.  Abdominal: Soft. Bowel sounds are normal. She exhibits no distension and no mass. There is no tenderness. There is no rebound and no guarding.  Musculoskeletal: Normal range of motion. She exhibits no edema and no tenderness.  Lymphadenopathy:    She has no cervical adenopathy.  Neurological: She is alert and oriented to person, place, and time. No cranial nerve deficit. She exhibits normal muscle tone. Coordination normal.  Skin: Skin is warm and dry. Rash (hyperpigmented papules over bilateral arms) noted. She is not diaphoretic. No erythema. No pallor.  Psychiatric: She has a normal mood and affect. Her behavior is normal. Judgment and thought content normal.          Assessment & Plan:

## 2013-01-21 NOTE — Assessment & Plan Note (Signed)
General medical exam normal today. Breast and pelvic exam deferred as recently completed by OB/GYN. Will request records on PAP. Encouraged continued efforts at healthy diet and regular physical activity. Discussed screening for breast cancer with mammogram, and various existing guidelines. Recommended starting with annual mammogram at age 33 given h/o breast cancer in both grandmothers. Will check labs today including CBC, CMP, lipids. Follow up 1 year and prn.

## 2013-01-21 NOTE — Assessment & Plan Note (Signed)
Symptoms seem most consistent with hyperpigmented scarring at area of previous folliculitis or solar dermatitis. Will set up dermatology evaluation. Question if she might benefit from topical steroid lotion.

## 2013-01-30 ENCOUNTER — Encounter: Payer: Self-pay | Admitting: Emergency Medicine

## 2013-03-10 ENCOUNTER — Other Ambulatory Visit (HOSPITAL_COMMUNITY): Payer: Self-pay | Admitting: Obstetrics and Gynecology

## 2013-03-10 ENCOUNTER — Ambulatory Visit (HOSPITAL_COMMUNITY)
Admission: RE | Admit: 2013-03-10 | Discharge: 2013-03-10 | Disposition: A | Discharge status: Home / Self Care | Payer: BC Managed Care – PPO | Source: Ambulatory Visit | Attending: Obstetrics and Gynecology | Admitting: Obstetrics and Gynecology

## 2013-03-10 DIAGNOSIS — T8332XA Displacement of intrauterine contraceptive device, initial encounter: Secondary | ICD-10-CM

## 2013-03-10 DIAGNOSIS — Z30431 Encounter for routine checking of intrauterine contraceptive device: Secondary | ICD-10-CM | POA: Insufficient documentation

## 2013-03-13 ENCOUNTER — Encounter (HOSPITAL_COMMUNITY): Payer: Self-pay

## 2013-03-18 NOTE — H&P (Addendum)
32 yo presented for IUD removal.  IUD could not be located on physical exam or PV ultrasound.  Abdominal xray shows IUD in abdomen.  PMHx:  Migraines, kidney stones PSHx:  Ureteral stent All:  None Meds:  PNV, topamax, flexeril, zoloft FHx:  N/c Shx:  Negative  AF, VSS Gen - NAD ABD - soft, NT CV - RRR Lungs - clear Ext NT  A/P:  Extrauterine IUD Laparoscopy with removal of IUD Plan of care reviewed R/b/a discussed, informed consent obtained.

## 2013-03-19 MED ORDER — DEXTROSE 5 % IV SOLN
2.0000 g | INTRAVENOUS | Status: AC
  Administered 2013-03-20: 2 g via INTRAVENOUS
  Filled 2013-03-19: qty 2

## 2013-03-20 ENCOUNTER — Encounter (HOSPITAL_COMMUNITY)
Admission: RE | Disposition: A | Discharge status: Home / Self Care | Payer: Self-pay | Source: Ambulatory Visit | Attending: Obstetrics and Gynecology

## 2013-03-20 ENCOUNTER — Ambulatory Visit (HOSPITAL_COMMUNITY)
Admission: RE | Admit: 2013-03-20 | Discharge: 2013-03-20 | Disposition: A | Discharge status: Home / Self Care | Payer: BC Managed Care – PPO | Source: Ambulatory Visit | Attending: Obstetrics and Gynecology | Admitting: Obstetrics and Gynecology

## 2013-03-20 ENCOUNTER — Encounter (HOSPITAL_COMMUNITY): Payer: Self-pay | Admitting: Anesthesiology

## 2013-03-20 ENCOUNTER — Encounter (HOSPITAL_COMMUNITY): Payer: Self-pay | Admitting: *Deleted

## 2013-03-20 ENCOUNTER — Ambulatory Visit (HOSPITAL_COMMUNITY): Payer: BC Managed Care – PPO | Admitting: Anesthesiology

## 2013-03-20 DIAGNOSIS — Y929 Unspecified place or not applicable: Secondary | ICD-10-CM | POA: Insufficient documentation

## 2013-03-20 DIAGNOSIS — Z87442 Personal history of urinary calculi: Secondary | ICD-10-CM | POA: Insufficient documentation

## 2013-03-20 DIAGNOSIS — G43909 Migraine, unspecified, not intractable, without status migrainosus: Secondary | ICD-10-CM | POA: Insufficient documentation

## 2013-03-20 DIAGNOSIS — T8389XA Other specified complication of genitourinary prosthetic devices, implants and grafts, initial encounter: Secondary | ICD-10-CM | POA: Insufficient documentation

## 2013-03-20 DIAGNOSIS — Y831 Surgical operation with implant of artificial internal device as the cause of abnormal reaction of the patient, or of later complication, without mention of misadventure at the time of the procedure: Secondary | ICD-10-CM | POA: Insufficient documentation

## 2013-03-20 HISTORY — PX: LAPAROSCOPY: SHX197

## 2013-03-20 LAB — CBC
MCH: 32.2 pg (ref 26.0–34.0)
MCHC: 35.4 g/dL (ref 30.0–36.0)
Platelets: 198 10*3/uL (ref 150–400)
RBC: 4.56 MIL/uL (ref 3.87–5.11)
RDW: 12.2 % (ref 11.5–15.5)

## 2013-03-20 LAB — PREGNANCY, URINE: Preg Test, Ur: NEGATIVE

## 2013-03-20 SURGERY — LAPAROSCOPY OPERATIVE
Anesthesia: General | Site: Abdomen | Wound class: Clean Contaminated

## 2013-03-20 MED ORDER — GLYCOPYRROLATE 0.2 MG/ML IJ SOLN
INTRAMUSCULAR | Status: AC
  Filled 2013-03-20: qty 1

## 2013-03-20 MED ORDER — DEXAMETHASONE SODIUM PHOSPHATE 10 MG/ML IJ SOLN
INTRAMUSCULAR | Status: DC | PRN
  Administered 2013-03-20: 5 mg via INTRAVENOUS

## 2013-03-20 MED ORDER — ONDANSETRON HCL 4 MG/2ML IJ SOLN
INTRAMUSCULAR | Status: AC
  Filled 2013-03-20: qty 2

## 2013-03-20 MED ORDER — LACTATED RINGERS IV SOLN
INTRAVENOUS | Status: DC
  Administered 2013-03-20: 16:00:00 via INTRAVENOUS
  Administered 2013-03-20: 125 mL/h via INTRAVENOUS
  Administered 2013-03-20: 14:00:00 via INTRAVENOUS

## 2013-03-20 MED ORDER — SUFENTANIL CITRATE 50 MCG/ML IV SOLN: INTRAVENOUS | Status: DC | PRN

## 2013-03-20 MED ORDER — MEPERIDINE HCL 25 MG/ML IJ SOLN: 6.2500 mg | INTRAMUSCULAR | Status: DC | PRN

## 2013-03-20 MED ORDER — DEXAMETHASONE SODIUM PHOSPHATE 10 MG/ML IJ SOLN
INTRAMUSCULAR | Status: AC
  Filled 2013-03-20: qty 1

## 2013-03-20 MED ORDER — ROCURONIUM BROMIDE 50 MG/5ML IV SOLN
INTRAVENOUS | Status: AC
  Filled 2013-03-20: qty 1

## 2013-03-20 MED ORDER — PROMETHAZINE HCL 25 MG/ML IJ SOLN: 6.2500 mg | INTRAMUSCULAR | Status: DC | PRN

## 2013-03-20 MED ORDER — FENTANYL CITRATE 0.05 MG/ML IJ SOLN
INTRAMUSCULAR | Status: DC | PRN
  Administered 2013-03-20 (×3): 50 ug via INTRAVENOUS

## 2013-03-20 MED ORDER — NEOSTIGMINE METHYLSULFATE 1 MG/ML IJ SOLN
INTRAMUSCULAR | Status: AC
  Filled 2013-03-20: qty 1

## 2013-03-20 MED ORDER — KETOROLAC TROMETHAMINE 30 MG/ML IJ SOLN: 30.0000 mg | Freq: Once | INTRAMUSCULAR | Status: AC

## 2013-03-20 MED ORDER — LIDOCAINE HCL (CARDIAC) 20 MG/ML IV SOLN
INTRAVENOUS | Status: AC
  Filled 2013-03-20: qty 5

## 2013-03-20 MED ORDER — KETOROLAC TROMETHAMINE 30 MG/ML IJ SOLN: 15.0000 mg | Freq: Once | INTRAMUSCULAR | Status: DC | PRN

## 2013-03-20 MED ORDER — BUPIVACAINE HCL (PF) 0.25 % IJ SOLN
INTRAMUSCULAR | Status: AC
  Filled 2013-03-20: qty 30

## 2013-03-20 MED ORDER — PROPOFOL 10 MG/ML IV EMUL
INTRAVENOUS | Status: AC
  Filled 2013-03-20: qty 20

## 2013-03-20 MED ORDER — IBUPROFEN 200 MG PO TABS: 600.0000 mg | ORAL_TABLET | Freq: Four times a day (QID) | ORAL | Status: DC | PRN

## 2013-03-20 MED ORDER — KETOROLAC TROMETHAMINE 30 MG/ML IJ SOLN
INTRAMUSCULAR | Status: AC
  Administered 2013-03-20: 30 mg via INTRAVENOUS
  Filled 2013-03-20: qty 1

## 2013-03-20 MED ORDER — GLYCOPYRROLATE 0.2 MG/ML IJ SOLN
INTRAMUSCULAR | Status: AC
  Filled 2013-03-20: qty 2

## 2013-03-20 MED ORDER — FENTANYL CITRATE 0.05 MG/ML IJ SOLN
25.0000 ug | INTRAMUSCULAR | Status: DC | PRN
  Administered 2013-03-20: 50 ug via INTRAVENOUS

## 2013-03-20 MED ORDER — PROPOFOL 10 MG/ML IV BOLUS
INTRAVENOUS | Status: DC | PRN
  Administered 2013-03-20: 150 mg via INTRAVENOUS

## 2013-03-20 MED ORDER — MIDAZOLAM HCL 2 MG/2ML IJ SOLN: 0.5000 mg | Freq: Once | INTRAMUSCULAR | Status: DC | PRN

## 2013-03-20 MED ORDER — BUPIVACAINE HCL (PF) 0.25 % IJ SOLN
INTRAMUSCULAR | Status: DC | PRN
  Administered 2013-03-20: 10 mL

## 2013-03-20 MED ORDER — LIDOCAINE HCL (CARDIAC) 20 MG/ML IV SOLN
INTRAVENOUS | Status: DC | PRN
  Administered 2013-03-20: 30 mg via INTRAVENOUS

## 2013-03-20 MED ORDER — FENTANYL CITRATE 0.05 MG/ML IJ SOLN
INTRAMUSCULAR | Status: AC
  Administered 2013-03-20: 50 ug via INTRAVENOUS
  Filled 2013-03-20: qty 2

## 2013-03-20 MED ORDER — MIDAZOLAM HCL 5 MG/5ML IJ SOLN
INTRAMUSCULAR | Status: DC | PRN
  Administered 2013-03-20: 2 mg via INTRAVENOUS

## 2013-03-20 MED ORDER — FENTANYL CITRATE 0.05 MG/ML IJ SOLN
INTRAMUSCULAR | Status: AC
  Filled 2013-03-20: qty 5

## 2013-03-20 MED ORDER — MIDAZOLAM HCL 2 MG/2ML IJ SOLN
INTRAMUSCULAR | Status: AC
  Filled 2013-03-20: qty 2

## 2013-03-20 MED ORDER — OXYCODONE-ACETAMINOPHEN 5-325 MG PO TABS: 1.0000 | ORAL_TABLET | ORAL | Status: DC | PRN

## 2013-03-20 MED ORDER — NEOSTIGMINE METHYLSULFATE 1 MG/ML IJ SOLN
INTRAMUSCULAR | Status: DC | PRN
  Administered 2013-03-20: 2 mg via INTRAVENOUS

## 2013-03-20 MED ORDER — ROCURONIUM BROMIDE 100 MG/10ML IV SOLN
INTRAVENOUS | Status: DC | PRN
  Administered 2013-03-20: 30 mg via INTRAVENOUS

## 2013-03-20 MED ORDER — ONDANSETRON HCL 4 MG/2ML IJ SOLN
INTRAMUSCULAR | Status: DC | PRN
  Administered 2013-03-20: 4 mg via INTRAVENOUS

## 2013-03-20 MED ORDER — GLYCOPYRROLATE 0.2 MG/ML IJ SOLN
INTRAMUSCULAR | Status: DC | PRN
  Administered 2013-03-20: 0.4 mg via INTRAVENOUS

## 2013-03-20 SURGICAL SUPPLY — 31 items
ADH SKN CLS APL DERMABOND .7 (GAUZE/BANDAGES/DRESSINGS) ×1
BAG SPEC RTRVL LRG 6X4 10 (ENDOMECHANICALS)
BARRIER ADHS 3X4 INTERCEED (GAUZE/BANDAGES/DRESSINGS) IMPLANT
BRR ADH 4X3 ABS CNTRL BYND (GAUZE/BANDAGES/DRESSINGS)
CABLE HIGH FREQUENCY MONO STRZ (ELECTRODE) IMPLANT
CATH ROBINSON RED A/P 16FR (CATHETERS) ×2 IMPLANT
CHLORAPREP W/TINT 26ML (MISCELLANEOUS) ×4 IMPLANT
CLOTH BEACON ORANGE TIMEOUT ST (SAFETY) ×2 IMPLANT
DERMABOND ADVANCED (GAUZE/BANDAGES/DRESSINGS) ×1
DERMABOND ADVANCED .7 DNX12 (GAUZE/BANDAGES/DRESSINGS) ×1 IMPLANT
GLOVE BIO SURGEON STRL SZ 6.5 (GLOVE) ×2 IMPLANT
GLOVE BIOGEL PI IND STRL 7.0 (GLOVE) ×1 IMPLANT
GLOVE BIOGEL PI INDICATOR 7.0 (GLOVE) ×1
GOWN PREVENTION PLUS LG XLONG (DISPOSABLE) ×4 IMPLANT
NS IRRIG 1000ML POUR BTL (IV SOLUTION) ×2 IMPLANT
PACK LAPAROSCOPY BASIN (CUSTOM PROCEDURE TRAY) ×2 IMPLANT
POUCH SPECIMEN RETRIEVAL 10MM (ENDOMECHANICALS) IMPLANT
PROTECTOR NERVE ULNAR (MISCELLANEOUS) ×2 IMPLANT
SEALER TISSUE G2 CVD JAW 35 (ENDOMECHANICALS) IMPLANT
SEALER TISSUE G2 CVD JAW 45CM (ENDOMECHANICALS) IMPLANT
SET IRRIG TUBING LAPAROSCOPIC (IRRIGATION / IRRIGATOR) IMPLANT
STRIP CLOSURE SKIN 1/2X4 (GAUZE/BANDAGES/DRESSINGS) IMPLANT
SUT VIC AB 3-0 PS2 18 (SUTURE) ×2
SUT VIC AB 3-0 PS2 18XBRD (SUTURE) ×1 IMPLANT
SUT VICRYL 0 UR6 27IN ABS (SUTURE) ×2 IMPLANT
TOWEL OR 17X24 6PK STRL BLUE (TOWEL DISPOSABLE) ×4 IMPLANT
TROCAR OPTI TIP 5M 100M (ENDOMECHANICALS) ×2 IMPLANT
TROCAR XCEL NON-BLD 11X100MML (ENDOMECHANICALS) ×2 IMPLANT
TROCAR XCEL OPT SLVE 5M 100M (ENDOMECHANICALS) IMPLANT
WARMER LAPAROSCOPE (MISCELLANEOUS) ×2 IMPLANT
WATER STERILE IRR 1000ML POUR (IV SOLUTION) ×2 IMPLANT

## 2013-03-20 NOTE — Op Note (Signed)
NAME:  Alejandra Cook, Alejandra Cook NO.:  [id number]  MEDICAL RECORD NO.:  [id number]  LOCATION:  [facility]                          FACILITY:  [facility]  PHYSICIAN:  Zelphia Cairo, MD    DATE OF BIRTH:  [birth year]  DATE OF PROCEDURE: DATE OF DISCHARGE:                              OPERATIVE REPORT   PREOPERATIVE DIAGNOSIS:  Intra-abdominal Mirena intrauterine device.  POSTOPERATIVE DIAGNOSIS:  Intra-abdominal Mirena intrauterine device.  PROCEDURE:  Operative laparoscopy with removal of Mirena intrauterine device from the omentum.  SURGEON:  Zelphia Cairo, MD.  ANESTHESIA:  General.  COMPLICATIONS:  None.  BLOOD LOSS:  Minimal.  CONDITION:  Stable to recovery room.  PROCEDURE IN DETAIL:  The patient was taken to the operating room. After informed consent was obtained, she was given general anesthesia, placed in the dorsal lithotomy position using Allen stirrups.  She was prepped and draped in sterile fashion.  An in-and-out catheter was used to drain her bladder.  Bivalve speculum was placed in the vagina and a single-tooth tenaculum placed on the anterior lip of the cervix.  Hulka clamp was placed on the cervix.  Tenaculum and speculum were removed and our attention was turned to the abdomen.  An infraumbilical skin incision was made after 0.25% Marcaine was injected for local anesthesia.  The incision was extended bluntly to the level of the fascia using a Kelly clamp.  Optical trocar was then inserted under direct visualization.  The survey was performed.  Right upper quadrant appeared normal.  Uterus and bilateral ovaries appeared normal.  IUD could not be seen despite putting the patient in steep Trendelenburg and airplane position.  A suprapubic incision was then made with the scalpel and a 5 mm trocar was inserted under direct visualization.  A blunt probe was inserted and the bowel was manipulated from right to left.  IUD was then seen in the omentum.  It was  grasped with atraumatic graspers and tented upwards.  It was adhered to the omentum.  The arms were adhered to the omentum and the string was adhered to the omentum.  Atraumatic graspers were then inserted through the operative scope and the omentum was gently teased free from the IUD. The IUD was then removed from the abdomen.  The omentum was inspected and found to be hemostatic.  All instruments and trocars were then removed.  The deep stitch was placed in the infraumbilical incision. Skin was closed with Vicryl.  Suprapubic incision was closed with Vicryl and Dermabond was placed over both incisions, and the Hulka clamp was removed.  The patient was extubated and taken to the recovery room in stable condition.  Sponge, lap, needle, and instrument counts were correct x2.     Zelphia Cairo, MD     GA/MEDQ  D:  03/20/2013  T:  03/20/2013  Job:  [id number]

## 2013-03-20 NOTE — Transfer of Care (Signed)
Immediate Anesthesia Transfer of Care Note  Patient: Alejandra Cook  Procedure(s) Performed: Procedure(s) with comments: LAPAROSCOPY OPERATIVE  IUD REMOVAL (N/A) - Intraabdominal from Omentum  Patient Location: PACU  Anesthesia Type:General  Level of Consciousness: awake  Airway & Oxygen Therapy: Patient Spontanous Breathing  Post-op Assessment: Report given to PACU RN  Post vital signs: stable  Filed Vitals:   03/20/13 1312  Pulse: 84  Temp: 36.7 C  Resp: 18    Complications: No apparent anesthesia complications

## 2013-03-20 NOTE — Anesthesia Preprocedure Evaluation (Signed)
Anesthesia Evaluation  Patient identified by MRN, date of birth, ID band Patient awake    Reviewed: Allergy & Precautions, H&P , Patient's Chart, lab work & pertinent test results, reviewed documented beta blocker date and time   History of Anesthesia Complications Negative for: history of anesthetic complications  Airway Mallampati: II TM Distance: >3 FB Neck ROM: full    Dental no notable dental hx.    Pulmonary neg pulmonary ROS,  breath sounds clear to auscultation  Pulmonary exam normal       Cardiovascular Exercise Tolerance: Good negative cardio ROS  Rhythm:regular Rate:Normal     Neuro/Psych  Headaches,  Neuromuscular disease negative neurological ROS  negative psych ROS   GI/Hepatic negative GI ROS, Neg liver ROS,   Endo/Other  negative endocrine ROS  Renal/GU negative Renal ROS     Musculoskeletal   Abdominal   Peds  Hematology negative hematology ROS (+)   Anesthesia Other Findings Headache(784.0)   Dr. 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   Dental Advisory Given  Plan Discussed with: CRNA and Surgeon  Anesthesia Plan Comments:          Anesthesia Quick Evaluation

## 2013-03-20 NOTE — Anesthesia Postprocedure Evaluation (Signed)
Anesthesia Post Note  Patient: Alejandra Cook  Procedure(s) Performed: Procedure(s) (LRB): LAPAROSCOPY OPERATIVE  IUD REMOVAL (N/A)  Anesthesia type: GA  Patient location: PACU  Post pain: Pain level controlled  Post assessment: Post-op Vital signs reviewed  Last Vitals:  Filed Vitals:   03/20/13 1630  BP: 108/69  Pulse: 62  Temp:   Resp: 16    Post vital signs: Reviewed  Level of consciousness: sedated  Complications: No apparent anesthesia complications

## 2013-03-23 ENCOUNTER — Encounter (HOSPITAL_COMMUNITY): Payer: Self-pay | Admitting: Obstetrics and Gynecology

## 2013-05-13 ENCOUNTER — Ambulatory Visit (INDEPENDENT_AMBULATORY_CARE_PROVIDER_SITE_OTHER): Payer: BC Managed Care – PPO | Admitting: Adult Health

## 2013-05-13 ENCOUNTER — Ambulatory Visit: Payer: Self-pay | Admitting: Adult Health

## 2013-05-13 ENCOUNTER — Encounter: Payer: Self-pay | Admitting: Adult Health

## 2013-05-13 VITALS — BP 106/68 | HR 100 | Temp 98.0°F | Resp 12 | Wt 135.0 lb

## 2013-05-13 DIAGNOSIS — R079 Chest pain, unspecified: Secondary | ICD-10-CM

## 2013-05-13 NOTE — Assessment & Plan Note (Signed)
Constant chest pain worse with inspiration. O2 sats are 98% on room air. Patient has taken TUMS without relief. EKG does not show any ischemia. ? PE. Send for CT of the chest. Needs pregnancy and creatinine prior to having.

## 2013-05-13 NOTE — Progress Notes (Signed)
  Subjective:    Patient ID: Alejandra Cook, female    DOB: Jul 22, 1981, 32 y.o.   MRN: [id number]  HPI  Patient presents with pain in between both breast in the center of chest that began about 12:30 pm. She denies feeling like she is going to pass out or diaphoresis. She reports feeling like she has to "take a bigger breath". Some sharp pain with inspiration. She does not take any birth control medication. No hx of blood clots or family hx of that she is aware of. Initially she took [place] thinking that it might be acid indigestion however this did not alleviate her symptoms. The pain is persistent.   Current Outpatient Prescriptions on File Prior to Visit  Medication Sig Dispense Refill  . Calcium Carb-Cholecalciferol (CALCIUM 500 +D PO) Take 1 tablet by mouth daily.      . Cholecalciferol (VITAMIN D) 400 UNITS capsule Take 400 Units by mouth 2 (two) times daily.      . Coenzyme Q10 (CO Q 10 PO) Take 50 mg by mouth 2 (two) times daily.       . cyclobenzaprine (FLEXERIL) 10 MG tablet Take 1 tablet (10 mg total) by mouth 3 (three) times daily as needed for muscle spasms.  90 tablet  6  . ibuprofen (ADVIL) 200 MG tablet Take 3 tablets (600 mg total) by mouth every 6 (six) hours as needed for pain.  30 tablet  0  . Magnesium 65 MG TABS Take 1 tablet by mouth daily.       . Prenatal Vit-Fe Fumarate-FA (PRENATAL MULTIVITAMIN) TABS tablet Take 1 tablet by mouth daily at 12 noon.      . thiamine 100 MG tablet Take 100 mg by mouth daily.       No current facility-administered medications on file prior to visit.     Review of Systems  Constitutional: Negative.   Respiratory: Negative for cough, chest tightness and wheezing.        Feels she has to take a bigger breath. Chest pain worse with inspiration. Feels sharp  Cardiovascular: Positive for chest pain. Negative for palpitations and leg swelling.  Gastrointestinal: Negative for nausea, vomiting and abdominal pain.  Neurological: Negative for  syncope.  Psychiatric/Behavioral: Negative.   All other systems reviewed and are negative.       Objective:   Physical Exam  Constitutional: She is oriented to person, place, and time. She appears well-developed and well-nourished. No distress.  Cardiovascular: Normal rate, regular rhythm and normal heart sounds.  Exam reveals no gallop and no friction rub.   No murmur heard. HR 100  Pulmonary/Chest: Effort normal and breath sounds normal. No respiratory distress. She has no wheezes. She has no rales.  Abdominal: Soft. Bowel sounds are normal. She exhibits no distension. There is no tenderness. There is no rebound and no guarding.  Neurological: She is alert and oriented to person, place, and time.  Skin: Skin is warm and dry.  Psychiatric: She has a normal mood and affect. Her behavior is normal. Judgment and thought content normal.  Calm but concerned.    BP 106/68  Pulse 100  Temp(Src) 98 F (36.7 C) (Oral)  Resp 12  Wt 135 lb (61.236 kg)  BMI 23.92 kg/m2  SpO2 98%       Assessment & Plan:

## 2013-05-14 ENCOUNTER — Encounter: Payer: Self-pay | Admitting: Internal Medicine

## 2013-05-14 ENCOUNTER — Telehealth: Payer: Self-pay | Admitting: Internal Medicine

## 2013-05-14 ENCOUNTER — Encounter: Payer: Self-pay | Admitting: Adult Health

## 2013-05-14 NOTE — Telephone Encounter (Signed)
Pt calling regarding appt with Raquel yesterday.  States she has not heard back from Raquel regarding previous [employer].  Please call her at work.

## 2013-05-14 NOTE — Telephone Encounter (Signed)
Called and spoke with patient. CT results negative for PE. She reports that she is having some congestion but mostly early in the morning none later in the day. She reported having a low-grade fever; however, she has not taken her temperature. Still having some mid sternal to lower epigastric pain. Instructed to take ibuprofen 400 mg every 6 hours for the next 4-5 days. Also instructed to take Nexium for 14 days. Patient normally exercises either on the treadmill or similar-type machine. She is also a Programmer, systems. Wondered if perhaps she pulled a muscle. If she develops a fever or symptoms worsen she needs to call the office. She was agreeable to the plan discussed.

## 2013-06-01 ENCOUNTER — Encounter: Payer: Self-pay | Admitting: Adult Health

## 2013-06-02 ENCOUNTER — Encounter: Payer: Self-pay | Admitting: Adult Health

## 2013-06-08 ENCOUNTER — Encounter: Payer: Self-pay | Admitting: Internal Medicine

## 2013-06-09 ENCOUNTER — Encounter: Payer: Self-pay | Admitting: Adult Health

## 2013-06-09 ENCOUNTER — Ambulatory Visit (INDEPENDENT_AMBULATORY_CARE_PROVIDER_SITE_OTHER): Payer: BC Managed Care – PPO | Admitting: Adult Health

## 2013-06-09 VITALS — BP 102/60 | HR 93 | Temp 98.3°F | Resp 12 | Wt 134.5 lb

## 2013-06-09 DIAGNOSIS — J0100 Acute maxillary sinusitis, unspecified: Secondary | ICD-10-CM

## 2013-06-09 MED ORDER — AMOXICILLIN-POT CLAVULANATE 875-125 MG PO TABS: 1.0000 | ORAL_TABLET | Freq: Two times a day (BID) | ORAL | Status: DC

## 2013-06-09 NOTE — Progress Notes (Signed)
Pre-visit discussion using our clinic review tool. No additional management support is needed unless otherwise documented below in the visit note.  

## 2013-06-09 NOTE — Progress Notes (Signed)
  Subjective:    Patient ID: Alejandra Cook, female    DOB: Jan 29, 1981, 32 y.o.   MRN: [id number]  HPI  Patient presents to clinic with sinus pressure, HA, pain in teeth, pain over sinuses.  Also feeling symptoms in her chest. Denies cough at present. She reports having a low grade fever last night (99.7). She has flonase but has not used in 2 months.   Current Outpatient Prescriptions on File Prior to Visit  Medication Sig Dispense Refill  . Calcium Carb-Cholecalciferol (CALCIUM 500 +D PO) Take 1 tablet by mouth daily.      . Cholecalciferol (VITAMIN D) 400 UNITS capsule Take 400 Units by mouth 2 (two) times daily.      . Coenzyme Q10 (CO Q 10 PO) Take 50 mg by mouth 2 (two) times daily.       . cyclobenzaprine (FLEXERIL) 10 MG tablet Take 1 tablet (10 mg total) by mouth 3 (three) times daily as needed for muscle spasms.  90 tablet  6  . ibuprofen (ADVIL) 200 MG tablet Take 3 tablets (600 mg total) by mouth every 6 (six) hours as needed for pain.  30 tablet  0  . Magnesium 65 MG TABS Take 1 tablet by mouth daily.       . Prenatal Vit-Fe Fumarate-FA (PRENATAL MULTIVITAMIN) TABS tablet Take 1 tablet by mouth daily at 12 noon.      . thiamine 100 MG tablet Take 100 mg by mouth daily.       No current facility-administered medications on file prior to visit.       Review of Systems  Constitutional: Negative for fever and chills.  HENT: Positive for congestion and sinus pressure. Negative for sore throat.        Teeth pain, maxillary sinus pain       Objective:   Physical Exam  Constitutional: She is oriented to person, place, and time. She appears well-developed and well-nourished. No distress.  HENT:  Head: Normocephalic and atraumatic.  Pharyngeal erythema. Drainage posterior pharynx  Cardiovascular: Normal rate and regular rhythm.  Exam reveals no gallop and no friction rub.   No murmur heard. Pulmonary/Chest: Effort normal and breath sounds normal.  Neurological: She is alert  and oriented to person, place, and time.  Psychiatric: She has a normal mood and affect. Her behavior is normal. Judgment and thought content normal.          Assessment & Plan:

## 2013-06-09 NOTE — Assessment & Plan Note (Signed)
Augmentin bid x 10 days. Flonase nasal spray as well as saline spray. RTC if no improvement within 4-5 days.

## 2013-06-09 NOTE — Patient Instructions (Signed)
  Augmentin twice a day for 10 days.  Use saline spray to irrigate your sinuses.  Use the flonase nasal spray 2 sprays into each nostril daily.  Please call if no improvement within 4-5 days.

## 2013-06-11 ENCOUNTER — Other Ambulatory Visit: Payer: Self-pay

## 2013-06-30 ENCOUNTER — Encounter: Payer: Self-pay | Admitting: Internal Medicine

## 2013-07-01 ENCOUNTER — Ambulatory Visit: Payer: BC Managed Care – PPO | Admitting: Adult Health

## 2013-07-01 ENCOUNTER — Ambulatory Visit (INDEPENDENT_AMBULATORY_CARE_PROVIDER_SITE_OTHER): Payer: BC Managed Care – PPO | Admitting: Adult Health

## 2013-07-01 ENCOUNTER — Encounter: Payer: Self-pay | Admitting: Adult Health

## 2013-07-01 VITALS — BP 122/70 | HR 87 | Temp 97.8°F | Resp 14 | Wt 133.0 lb

## 2013-07-01 DIAGNOSIS — R21 Rash and other nonspecific skin eruption: Secondary | ICD-10-CM

## 2013-07-01 MED ORDER — DOXYCYCLINE HYCLATE 100 MG PO TABS: 100.0000 mg | ORAL_TABLET | Freq: Two times a day (BID) | ORAL | Status: DC

## 2013-07-01 NOTE — Patient Instructions (Signed)
  Start doxycycline 100 mg twice daily for 14 days.   Mild face wash such as Neutrogena  May try calamine lotion or benadryl lotion for itching. You can also take benadryl 25 mg every 8 hours as needed for itching.

## 2013-07-01 NOTE — Assessment & Plan Note (Signed)
Treat empirically with doxycycline 100 mg twice a day x14 days. Benadryl lotion for mild itching. May also take Benadryl 25 mg every 8 hours as needed for itching. Calamine lotion. RTC if no improvement within 2-3 days or sooner if necessary.

## 2013-07-01 NOTE — Progress Notes (Signed)
   Subjective:    Patient ID: Alejandra Cook, female    DOB: 11/25/1980, 32 y.o.   MRN: [id number]  HPI  Patient is a pleasant 32 year old female who presents to clinic with a rash on her face chest and back. She reports being treated with prednisone for a sinus infection. She completed the prednisone on Sunday and it reports itchiness and rash beginning on Monday. She denies any other new medication. She has not changed soaps, detergents, lotions or perfumes. Denies difficulty breathing, skin pigmentation changes.   Current Outpatient Prescriptions on File Prior to Visit  Medication Sig Dispense Refill  . Calcium Carb-Cholecalciferol (CALCIUM 500 +D PO) Take 1 tablet by mouth daily.      . Cholecalciferol (VITAMIN D) 400 UNITS capsule Take 400 Units by mouth 2 (two) times daily.      . Coenzyme Q10 (CO Q 10 PO) Take 50 mg by mouth 2 (two) times daily.       . cyclobenzaprine (FLEXERIL) 10 MG tablet Take 1 tablet (10 mg total) by mouth 3 (three) times daily as needed for muscle spasms.  90 tablet  6  . ibuprofen (ADVIL) 200 MG tablet Take 3 tablets (600 mg total) by mouth every 6 (six) hours as needed for pain.  30 tablet  0  . Magnesium 65 MG TABS Take 1 tablet by mouth daily.       . Prenatal Vit-Fe Fumarate-FA (PRENATAL MULTIVITAMIN) TABS tablet Take 1 tablet by mouth daily at 12 noon.      . thiamine 100 MG tablet Take 100 mg by mouth daily.       No current facility-administered medications on file prior to visit.      Review of Systems  Respiratory: Negative.   Skin: Positive for rash.       Rash on face, neck and back       Objective:   Physical Exam  Constitutional: She is oriented to person, place, and time. She appears well-developed and well-nourished. No distress.  Pulmonary/Chest: Effort normal. No respiratory distress.  Neurological: She is alert and oriented to person, place, and time.  Skin: Rash noted.  Maculopapular rash on neck, chest and upper back.    Psychiatric: She has a normal mood and affect. Her behavior is normal. Judgment and thought content normal.   BP 122/70  Pulse 87  Temp(Src) 97.8 F (36.6 C) (Oral)  Resp 14  Wt 133 lb (60.328 kg)  SpO2 99%        Assessment & Plan:

## 2013-07-01 NOTE — Progress Notes (Signed)
Pre visit review using our clinic review tool, if applicable. No additional management support is needed unless otherwise documented below in the visit note. 

## 2013-07-03 ENCOUNTER — Encounter: Payer: Self-pay | Admitting: Adult Health

## 2013-07-03 ENCOUNTER — Encounter: Payer: Self-pay | Admitting: Internal Medicine

## 2013-07-06 ENCOUNTER — Other Ambulatory Visit: Payer: Self-pay | Admitting: Adult Health

## 2013-07-06 DIAGNOSIS — O039 Complete or unspecified spontaneous abortion without complication: Secondary | ICD-10-CM

## 2013-07-06 HISTORY — DX: Complete or unspecified spontaneous abortion without complication: O03.9

## 2013-07-06 MED ORDER — CEPHALEXIN 750 MG PO CAPS: 750.0000 mg | ORAL_CAPSULE | Freq: Three times a day (TID) | ORAL | Status: DC

## 2013-07-06 NOTE — Telephone Encounter (Signed)
Message have been sent on to Raquel, [place]

## 2013-08-06 NOTE — H&P (Addendum)
33 yo with missed Ab presents for surgical mngt.  PMHx: migraines PSHx: leep, laparoscopy All:  None Meds:  PNV SHx:  Negative tobacco  Af, VSS Gen - NAD CV - RRR Lungs - clear Abd - soft, NT PV - deferred  A/P:  IUP, no FHT  A/P:  Missed Ab D&E R/b/a discussed, informed consent.

## 2013-08-07 ENCOUNTER — Encounter (HOSPITAL_COMMUNITY)
Admission: RE | Disposition: A | Discharge status: Home / Self Care | Payer: Self-pay | Source: Ambulatory Visit | Attending: Obstetrics and Gynecology

## 2013-08-07 ENCOUNTER — Encounter (HOSPITAL_COMMUNITY): Payer: Self-pay | Admitting: Pharmacist

## 2013-08-07 ENCOUNTER — Ambulatory Visit (HOSPITAL_COMMUNITY)
Admission: RE | Admit: 2013-08-07 | Discharge: 2013-08-07 | Disposition: A | Discharge status: Home / Self Care | Payer: BC Managed Care – PPO | Source: Ambulatory Visit | Attending: Obstetrics and Gynecology | Admitting: Obstetrics and Gynecology

## 2013-08-07 ENCOUNTER — Ambulatory Visit (HOSPITAL_COMMUNITY): Payer: BC Managed Care – PPO | Admitting: Anesthesiology

## 2013-08-07 ENCOUNTER — Encounter (HOSPITAL_COMMUNITY): Payer: BC Managed Care – PPO | Admitting: Anesthesiology

## 2013-08-07 DIAGNOSIS — O021 Missed abortion: Secondary | ICD-10-CM | POA: Insufficient documentation

## 2013-08-07 HISTORY — PX: DILATION AND EVACUATION: SHX1459

## 2013-08-07 SURGERY — DILATION AND EVACUATION, UTERUS
Anesthesia: Monitor Anesthesia Care | Site: Vagina

## 2013-08-07 MED ORDER — FENTANYL CITRATE 0.05 MG/ML IJ SOLN
25.0000 ug | INTRAMUSCULAR | Status: DC | PRN
  Administered 2013-08-07: 50 ug via INTRAVENOUS

## 2013-08-07 MED ORDER — CHLOROPROCAINE HCL 1 % IJ SOLN
INTRAMUSCULAR | Status: DC | PRN
  Administered 2013-08-07: 10 mL

## 2013-08-07 MED ORDER — LIDOCAINE HCL (CARDIAC) 20 MG/ML IV SOLN
INTRAVENOUS | Status: DC | PRN
  Administered 2013-08-07: 50 mg via INTRAVENOUS

## 2013-08-07 MED ORDER — LACTATED RINGERS IV SOLN
INTRAVENOUS | Status: DC
  Administered 2013-08-07 (×2): via INTRAVENOUS

## 2013-08-07 MED ORDER — PROPOFOL 10 MG/ML IV BOLUS
INTRAVENOUS | Status: DC | PRN
  Administered 2013-08-07 (×2): 20 mg via INTRAVENOUS
  Administered 2013-08-07: 2 mg via INTRAVENOUS

## 2013-08-07 MED ORDER — DEXAMETHASONE SODIUM PHOSPHATE 10 MG/ML IJ SOLN
INTRAMUSCULAR | Status: DC | PRN
  Administered 2013-08-07: 10 mg via INTRAVENOUS

## 2013-08-07 MED ORDER — OXYCODONE-ACETAMINOPHEN 5-325 MG PO TABS: 1.0000 | ORAL_TABLET | ORAL | Status: DC | PRN

## 2013-08-07 MED ORDER — PROPOFOL 10 MG/ML IV EMUL
INTRAVENOUS | Status: AC
  Filled 2013-08-07: qty 20

## 2013-08-07 MED ORDER — FENTANYL CITRATE 0.05 MG/ML IJ SOLN
INTRAMUSCULAR | Status: AC
  Filled 2013-08-07: qty 2

## 2013-08-07 MED ORDER — MEPERIDINE HCL 25 MG/ML IJ SOLN: 6.2500 mg | INTRAMUSCULAR | Status: DC | PRN

## 2013-08-07 MED ORDER — FENTANYL CITRATE 0.05 MG/ML IJ SOLN
INTRAMUSCULAR | Status: DC | PRN
  Administered 2013-08-07: 100 ug via INTRAVENOUS

## 2013-08-07 MED ORDER — LIDOCAINE HCL (CARDIAC) 20 MG/ML IV SOLN
INTRAVENOUS | Status: AC
  Filled 2013-08-07: qty 5

## 2013-08-07 MED ORDER — KETOROLAC TROMETHAMINE 30 MG/ML IJ SOLN: 15.0000 mg | Freq: Once | INTRAMUSCULAR | Status: DC | PRN

## 2013-08-07 MED ORDER — CHLOROPROCAINE HCL 1 % IJ SOLN
INTRAMUSCULAR | Status: AC
  Filled 2013-08-07: qty 30

## 2013-08-07 MED ORDER — DEXAMETHASONE SODIUM PHOSPHATE 10 MG/ML IJ SOLN
INTRAMUSCULAR | Status: AC
  Filled 2013-08-07: qty 1

## 2013-08-07 MED ORDER — DEXTROSE 5 % IV SOLN
2.0000 g | INTRAVENOUS | Status: AC
  Administered 2013-08-07: 2 g via INTRAVENOUS
  Filled 2013-08-07: qty 2

## 2013-08-07 MED ORDER — ONDANSETRON HCL 4 MG/2ML IJ SOLN
INTRAMUSCULAR | Status: AC
  Filled 2013-08-07: qty 2

## 2013-08-07 MED ORDER — MIDAZOLAM HCL 2 MG/2ML IJ SOLN: 0.5000 mg | Freq: Once | INTRAMUSCULAR | Status: DC | PRN

## 2013-08-07 MED ORDER — METHYLERGONOVINE MALEATE 0.2 MG PO TABS: 0.2000 mg | ORAL_TABLET | Freq: Three times a day (TID) | ORAL | Status: DC

## 2013-08-07 MED ORDER — MIDAZOLAM HCL 2 MG/2ML IJ SOLN
INTRAMUSCULAR | Status: DC | PRN
  Administered 2013-08-07: 2 mg via INTRAVENOUS

## 2013-08-07 MED ORDER — PROMETHAZINE HCL 25 MG/ML IJ SOLN: 6.2500 mg | INTRAMUSCULAR | Status: DC | PRN

## 2013-08-07 MED ORDER — IBUPROFEN 600 MG PO TABS: 600.0000 mg | ORAL_TABLET | Freq: Four times a day (QID) | ORAL | Status: DC | PRN

## 2013-08-07 MED ORDER — ONDANSETRON HCL 4 MG/2ML IJ SOLN
INTRAMUSCULAR | Status: DC | PRN
  Administered 2013-08-07: 4 mg via INTRAVENOUS

## 2013-08-07 MED ORDER — MIDAZOLAM HCL 2 MG/2ML IJ SOLN
INTRAMUSCULAR | Status: AC
  Filled 2013-08-07: qty 2

## 2013-08-07 MED ORDER — KETOROLAC TROMETHAMINE 30 MG/ML IJ SOLN
INTRAMUSCULAR | Status: DC | PRN
  Administered 2013-08-07: 30 mg via INTRAVENOUS

## 2013-08-07 MED ORDER — FENTANYL CITRATE 0.05 MG/ML IJ SOLN
INTRAMUSCULAR | Status: AC
  Administered 2013-08-07: 25 ug via INTRAVENOUS
  Filled 2013-08-07: qty 2

## 2013-08-07 SURGICAL SUPPLY — 21 items
CATH ROBINSON RED A/P 16FR (CATHETERS) ×2 IMPLANT
CLOTH BEACON ORANGE TIMEOUT ST (SAFETY) ×2 IMPLANT
DECANTER SPIKE VIAL GLASS SM (MISCELLANEOUS) ×2 IMPLANT
GLOVE BIO SURGEON STRL SZ 6.5 (GLOVE) ×2 IMPLANT
GLOVE BIOGEL PI IND STRL 7.0 (GLOVE) ×1 IMPLANT
GLOVE BIOGEL PI INDICATOR 7.0 (GLOVE) ×1
GOWN STRL REIN XL XLG (GOWN DISPOSABLE) ×4 IMPLANT
KIT BERKELEY 1ST TRIMESTER 3/8 (MISCELLANEOUS) ×2 IMPLANT
NDL SPNL 22GX3.5 QUINCKE BK (NEEDLE) ×1 IMPLANT
NEEDLE SPNL 22GX3.5 QUINCKE BK (NEEDLE) ×2 IMPLANT
NS IRRIG 1000ML POUR BTL (IV SOLUTION) ×2 IMPLANT
PACK VAGINAL MINOR WOMEN LF (CUSTOM PROCEDURE TRAY) ×2 IMPLANT
PAD OB MATERNITY 4.3X12.25 (PERSONAL CARE ITEMS) ×2 IMPLANT
PAD PREP 24X48 CUFFED NSTRL (MISCELLANEOUS) ×2 IMPLANT
SET BERKELEY SUCTION TUBING (SUCTIONS) ×2 IMPLANT
SYR CONTROL 10ML LL (SYRINGE) ×2 IMPLANT
TOWEL OR 17X24 6PK STRL BLUE (TOWEL DISPOSABLE) ×4 IMPLANT
VACURETTE 10 RIGID CVD (CANNULA) IMPLANT
VACURETTE 7MM CVD STRL WRAP (CANNULA) ×1 IMPLANT
VACURETTE 8 RIGID CVD (CANNULA) IMPLANT
VACURETTE 9 RIGID CVD (CANNULA) IMPLANT

## 2013-08-07 NOTE — Op Note (Signed)
NAMJimmy Picket:  Oehler, [name]                ACCOUNT NO.:  [id number]  MEDICAL RECORD NO.:  [id number]  LOCATION:  [facility]                          [facility]ACILITY:  [facility]  PHYSICIAN:  Zelphia CairoGretchen Tennille Montelongo, MD    DATE OF BIRTH:  [birth year]  DATE OF PROCEDURE:  08/07/2013 DATE OF DISCHARGE:  08/07/2013                              OPERATIVE REPORT   PREOPERATIVE DIAGNOSIS:  Missed abortion.  POSTOPERATIVE DIAGNOSIS:  Missed abortion.  PROCEDURE: 1. Cervical block 2. Dilation and evacuation.  SURGEON:  Zelphia CairoGretchen Hansford Hirt, MD.  SPECIMEN:  Products of conception.  ANESTHESIA:  MAC and local.  COMPLICATIONS:  None.  CONDITION:  Stable to recovery room.  DESCRIPTION OF PROCEDURE:  The patient was taken to the operating room. After informed consent was obtained, she was given anesthesia and placed in the dorsal lithotomy position using Allen stirrups.  She was prepped and draped in sterile fashion.  Bivalve speculum was placed in the vagina and 1 mL of 1% Nesacaine was placed at the anterior lip of the cervix.  Single-tooth tenaculum was attached to the anterior lip of the cervix and the remaining 9 mL of Nesacaine were used to provide a cervical block.  The cervix was then serially dilated using Pratt dilators.  Suction catheter was then inserted into the uterus and products of conception  were evacuated from the intrauterine cavity.  A gentle curetting was performed.  A uterine cry was noted throughout and no products of conception were removed.  The single-tooth tenaculum was then removed from the anterior lip of the cervix.  The cervix was hemostatic.  Speculum was removed.  She was taken to the recovery room in stable condition.  Sponge, lap, needle, and instrument counts were correct x2.     Zelphia CairoGretchen Myrle Wanek, MD    GA/MEDQ  D:  08/07/2013  T:  08/07/2013  Job:  [id number]

## 2013-08-07 NOTE — Transfer of Care (Signed)
Immediate Anesthesia Transfer of Care Note  Patient: Alejandra Cook  Procedure(s) Performed: Procedure(s): DILATATION AND EVACUATION (N/A)  Patient Location: PACU  Anesthesia Type:MAC  Level of Consciousness: sedated  Airway & Oxygen Therapy: Patient Spontanous Breathing  Post-op Assessment: Report given to PACU RN  Post vital signs: Reviewed and stable  Complications: No apparent anesthesia complications

## 2013-08-07 NOTE — Discharge Instructions (Signed)
[facility] office 2-3 weeks for postop appointment.  Call the office [phone] for an appointment. ° °Personal Hygiene: °Use pads not tampons x 1week °You may shower, no tub baths or pools for 2-3 weeks °Wipe from front to back when using restroom ° °Activity: °Do not drive or operate any equipment for 24 hrs.   °Do not rest in bed all day °Walking is encouraged °Walk up and down stairs slowly °You may return to your normal activity in 1-2 days ° °Sexual Activity:  No intercourse for 2 weeks after the procedure. ° °Diet: Eat a light meal as desired this evening.  You may resume your usual diet tomorrow. ° °Return to work:  You may resume your work activities after 1-2 days ° °What to expect:  Expect to have vaginal bleeding/discharge for 2-3 days and spotting for 10-14 days.  It is not unusual to have soreness for 1-2 weeks.  You may have a slight burning sensation when you urinate for the first few days.  You may start your menses in 2-6 weeks.  Mild cramps may continue for a couple of days.   ° °Call your doctor:   °Excessive bleeding, saturating a pad every hour °Inability to urinate 6 hours after discharge °Pain not relieved with pain medications °Fever of 100.4 or greater ° °

## 2013-08-07 NOTE — Anesthesia Preprocedure Evaluation (Signed)
Anesthesia Evaluation  Patient identified by MRN, date of birth, ID band Patient awake    Reviewed: Allergy & Precautions, H&P , Patient's Chart, lab work & pertinent test results, reviewed documented beta blocker date and time   History of Anesthesia Complications Negative for: history of anesthetic complications  Airway Mallampati: II TM Distance: >3 FB Neck ROM: full    Dental   Pulmonary  breath sounds clear to auscultation        Cardiovascular Exercise Tolerance: Good Rhythm:regular Rate:Normal     Neuro/Psych  Headaches,  Neuromuscular disease negative psych ROS   GI/Hepatic   Endo/Other    Renal/GU      Musculoskeletal   Abdominal   Peds  Hematology   Anesthesia Other Findings fibromyalga  Reproductive/Obstetrics                           Anesthesia Physical Anesthesia Plan  ASA: II  Anesthesia Plan: MAC   Post-op Pain Management:    Induction:   Airway Management Planned:   Additional Equipment:   Intra-op Plan:   Post-operative Plan:   Informed Consent: I have reviewed the patients History and Physical, chart, labs and discussed the procedure including the risks, benefits and alternatives for the proposed anesthesia with the patient or authorized representative who has indicated his/her understanding and acceptance.   Dental Advisory Given  Plan Discussed with: CRNA, Surgeon and Anesthesiologist  Anesthesia Plan Comments:         Anesthesia Quick Evaluation

## 2013-08-07 NOTE — Anesthesia Postprocedure Evaluation (Signed)
  Anesthesia Post-op Note  Anesthesia Post Note  Patient: Alejandra Cook  Procedure(s) Performed: Procedure(s) (LRB): DILATATION AND EVACUATION (N/A)  Anesthesia type: MAC  Patient location: PACU  Post pain: Pain level controlled  Post assessment: Post-op Vital signs reviewed  Last Vitals:  Filed Vitals:   08/07/13 1615  BP: 103/66  Pulse: 68  Temp:   Resp: 15    Post vital signs: Reviewed  Level of consciousness: sedated  Complications: No apparent anesthesia complications

## 2013-08-10 ENCOUNTER — Encounter (HOSPITAL_COMMUNITY): Payer: Self-pay | Admitting: Obstetrics and Gynecology

## 2013-09-17 ENCOUNTER — Encounter: Payer: Self-pay | Admitting: Internal Medicine

## 2013-09-25 ENCOUNTER — Ambulatory Visit (INDEPENDENT_AMBULATORY_CARE_PROVIDER_SITE_OTHER): Payer: BC Managed Care – PPO | Admitting: Internal Medicine

## 2013-09-25 ENCOUNTER — Encounter: Payer: Self-pay | Admitting: Internal Medicine

## 2013-09-25 VITALS — BP 108/72 | HR 96 | Temp 98.1°F | Resp 16 | Wt 138.5 lb

## 2013-09-25 DIAGNOSIS — D62 Acute posthemorrhagic anemia: Secondary | ICD-10-CM

## 2013-09-25 DIAGNOSIS — D649 Anemia, unspecified: Secondary | ICD-10-CM

## 2013-09-25 DIAGNOSIS — R5383 Other fatigue: Secondary | ICD-10-CM

## 2013-09-25 DIAGNOSIS — R5381 Other malaise: Secondary | ICD-10-CM

## 2013-09-25 LAB — VITAMIN B12: VITAMIN B 12: 558 pg/mL (ref 211–911)

## 2013-09-25 LAB — CBC WITH DIFFERENTIAL/PLATELET
BASOS PCT: 0.8 % (ref 0.0–3.0)
Basophils Absolute: 0 10*3/uL (ref 0.0–0.1)
EOS PCT: 1.8 % (ref 0.0–5.0)
Eosinophils Absolute: 0.1 10*3/uL (ref 0.0–0.7)
HCT: 44.9 % (ref 36.0–46.0)
HEMOGLOBIN: 15.2 g/dL — AB (ref 12.0–15.0)
LYMPHS PCT: 37.6 % (ref 12.0–46.0)
Lymphs Abs: 2 10*3/uL (ref 0.7–4.0)
MCHC: 33.9 g/dL (ref 30.0–36.0)
MCV: 96.5 fl (ref 78.0–100.0)
MONOS PCT: 8.3 % (ref 3.0–12.0)
Monocytes Absolute: 0.5 10*3/uL (ref 0.1–1.0)
NEUTROS ABS: 2.8 10*3/uL (ref 1.4–7.7)
NEUTROS PCT: 51.5 % (ref 43.0–77.0)
Platelets: 252 10*3/uL (ref 150.0–400.0)
RBC: 4.65 Mil/uL (ref 3.87–5.11)
RDW: 12.3 % (ref 11.5–14.6)
WBC: 5.4 10*3/uL (ref 4.5–10.5)

## 2013-09-25 LAB — COMPREHENSIVE METABOLIC PANEL
ALT: 20 U/L (ref 0–35)
AST: 18 U/L (ref 0–37)
Albumin: 4.3 g/dL (ref 3.5–5.2)
Alkaline Phosphatase: 57 U/L (ref 39–117)
BUN: 14 mg/dL (ref 6–23)
CALCIUM: 9.2 mg/dL (ref 8.4–10.5)
CHLORIDE: 106 meq/L (ref 96–112)
CO2: 30 mEq/L (ref 19–32)
CREATININE: 0.8 mg/dL (ref 0.4–1.2)
GFR: 91.99 mL/min (ref >60.00)
GLUCOSE: 85 mg/dL (ref 70–99)
POTASSIUM: 4.1 meq/L (ref 3.5–5.1)
Sodium: 140 mEq/L (ref 135–145)
Total Bilirubin: 0.6 mg/dL (ref 0.3–1.2)
Total Protein: 7 g/dL (ref 6.0–8.3)

## 2013-09-25 LAB — MAGNESIUM: Magnesium: 2.1 mg/dL (ref 1.5–2.5)

## 2013-09-25 NOTE — Patient Instructions (Signed)
Your fatigue is not unexpected given your recent illnesses.   Try rehydrating with gatorade or G2,  And stick to a balnd diet (mashed potatoes,  Rice/chicken, toast,  Crackers)  We are checking your cbc electrolytes and b12 level to makes sure they are not contributing   If no improvement in a week consider resuming sertraline .Marland Kitchen.  We can prescribe a nonaddicting medication for you insomnia if necessary (trazodone) but it would need to be stopped if you are plannign another pregnancy

## 2013-09-25 NOTE — Progress Notes (Signed)
Pre-visit discussion using our clinic review tool. No additional management support is needed unless otherwise documented below in the visit note.  

## 2013-09-25 NOTE — Progress Notes (Signed)
Patient ID: Chapman Moss, female   DOB: 10-Dec-1980, 33 y.o.   MRN: [id number]  Patient Active Problem List   Diagnosis Date Noted  . Other malaise and fatigue 09/27/2013  . Rash and nonspecific skin eruption 07/01/2013  . Chest pain 05/13/2013  . Routine general medical examination at a health care facility 01/21/2013  . Folliculitis 17/91/5056  . Dermatitis 06/09/2012  . Fibromyalgia 01/17/2012  . Dyspareunia 01/17/2012  . Allergic rhinitis 01/17/2012  . MIGRAINE 05/12/2007  . DEGENERATIVE DISC DISEASE, LUMBAR SPINE 05/12/2007    Subjective:  CC:   Chief Complaint  Patient presents with  . Follow-up    from urgent care for flu/ reports positive flu test B strand  . Fatigue    patient also report having stomach virus with flu.    HPI:   JAELIANA LOCOCO is a 33 y.o. female who presents for  Evaluation and treatment of fatigue.   Flu positive feb 7th,  Stayed Home for 7 days,  Then on the 13th developed signs and symptoms consistent with norovirus which  lasted 24 hours.  Although the vomiting has ceased, she presents today with persistent fatigue .   Recent Tumultous year. History of postpartum depression in 2011.   Treated with zoloft.  Off meds since August .  In August had IUD migration.  So IUD had to be removed.  She conceived  immediately then  Had a miscarriage in late December requiring a  D & E which was done on January 2 . Still wanting to conceive.         Past Medical History  Diagnosis Date  . PVXYIAXK(553.7)     Dr. Domingo Cocking at [facility]  . Degenerative disc disease, lumbar   . Bulging discs   . Fibromyalgia     after pregnancy, Followed by [facility]  . 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 Spontaneous abortion in second trimester Dec 2014    Past Surgical History  Procedure Laterality Date  . Nasal sinus surgery    . Pilonidal cyst excision    . Colonoscopy      normal  . Cystoscopy      ureteral stent inserted  .  Vaginal delivery  2011  . Colposcopy  2002  . Laparoscopy N/A 03/20/2013    Procedure: LAPAROSCOPY OPERATIVE  IUD REMOVAL;  Surgeon: Marylynn Pearson, MD;  Location: [facility];  Service: Gynecology;  Laterality: N/A;  Intraabdominal from [place]  . Dilation and evacuation N/A 08/07/2013    Procedure: DILATATION AND EVACUATION;  Surgeon: Marylynn Pearson, MD;  Location: [facility];  Service: Gynecology;  Laterality: N/A;       The following portions of the patient's history were reviewed and updated as appropriate: Allergies, current medications, and problem list.    Review of Systems:   Patient denies headache, fevers, malaise, unintentional weight loss, skin rash, eye pain, sinus congestion and sinus pain, sore throat, dysphagia,  hemoptysis , cough, dyspnea, wheezing, chest pain, palpitations, orthopnea, edema, abdominal pain, nausea, melena, diarrhea, constipation, flank pain, dysuria, hematuria, urinary  Frequency, nocturia, numbness, tingling, seizures,  Focal weakness, Loss of consciousness,  Tremor, insomnia, depression, anxiety, and suicidal ideation.     History   Social History  . Marital Status: Married    Spouse Name: N/A    Number of Children: N/A  . Years of Education: N/A   Occupational History  . Not on file.   Social History Main Topics  .  Smoking status: Never Smoker   . Smokeless tobacco: Never Used  . Alcohol Use: No  . Drug Use: No  . Sexual Activity: Yes   Other Topics Concern  . Not on file   Social History Narrative   Lives in [place]. 2YO daughter.      Works- Educational psychologist ED, [employer]    Objective:  Filed Vitals:   09/25/13 0859  BP: 108/72  Pulse: 96  Temp: 98.1 F (36.7 C)  Resp: 16     General appearance: alert, cooperative and appears stated age Ears: normal TM's and external ear canals both ears Throat: lips, mucosa, and tongue normal; teeth and gums normal Neck: no adenopathy, no carotid bruit, supple, symmetrical, trachea  midline and thyroid not enlarged, symmetric, no tenderness/mass/nodules Back: symmetric, no curvature. ROM normal. No CVA tenderness. Lungs: clear to auscultation bilaterally Heart: regular rate and rhythm, S1, S2 normal, no murmur, click, rub or gallop Abdomen: soft, non-tender; bowel sounds normal; no masses,  no organomegaly Pulses: 2+ and symmetric Skin: Skin color, texture, turgor normal. No rashes or lesions Lymph nodes: Cervical, supraclavicular, and axillary nodes normal.  Assessment and Plan:  Other malaise and fatigue Likely multifactorial from recent flu and GI illnesses.  checking lytes,  CBC all of which were normal.  Also discussed emotional impact of miscarriage on mood. Will consider medication for depression given history of post partum depression if labs normal.   Lab Results  Component Value Date   WBC 5.4 09/25/2013   HGB 15.2* 09/25/2013   HCT 44.9 09/25/2013   MCV 96.5 09/25/2013   PLT 252.0 09/25/2013   Lab Results  Component Value Date   NA 140 09/25/2013   K 4.1 09/25/2013   CL 106 09/25/2013   CO2 30 09/25/2013    Updated Medication List Outpatient Encounter Prescriptions as of 09/25/2013  Medication Sig  . cyclobenzaprine (FLEXERIL) 10 MG tablet Take 1 tablet (10 mg total) by mouth 3 (three) times daily as needed for muscle spasms.  . Prenatal Vit-Fe Fumarate-FA (PRENATAL MULTIVITAMIN) TABS tablet Take 1 tablet by mouth daily at 12 noon.  . traMADol (ULTRAM) 50 MG tablet Take 2 tablets by mouth 3 (three) times daily as needed.  . [DISCONTINUED] ibuprofen (ADVIL,MOTRIN) 600 MG tablet Take 1 tablet (600 mg total) by mouth every 6 (six) hours as needed.  . [DISCONTINUED] methylergonovine (METHERGINE) 0.2 MG tablet Take 1 tablet (0.2 mg total) by mouth 3 (three) times daily.  . [DISCONTINUED] oxyCODONE-acetaminophen (PERCOCET/ROXICET) 5-325 MG per tablet Take 1-2 tablets by mouth every 4 (four) hours as needed for severe pain.     Orders Placed This Encounter   Procedures  . Vitamin B12  . Magnesium  . Comp Met (CMET)  . CBC with Differential    No Follow-up on file.

## 2013-09-27 ENCOUNTER — Encounter: Payer: Self-pay | Admitting: Internal Medicine

## 2013-09-27 DIAGNOSIS — R5381 Other malaise: Secondary | ICD-10-CM | POA: Insufficient documentation

## 2013-09-27 DIAGNOSIS — R5383 Other fatigue: Secondary | ICD-10-CM | POA: Insufficient documentation

## 2013-09-27 NOTE — Assessment & Plan Note (Addendum)
Likely multifactorial from recent flu and GI illnesses.  checking lytes,  CBC all of which were normal.  Also discussed emotional impact of miscarriage on mood. Will consider medication for depression given history of post partum depression if labs normal.   Lab Results  Component Value Date   WBC 5.4 09/25/2013   HGB 15.2* 09/25/2013   HCT 44.9 09/25/2013   MCV 96.5 09/25/2013   PLT 252.0 09/25/2013   Lab Results  Component Value Date   NA 140 09/25/2013   K 4.1 09/25/2013   CL 106 09/25/2013   CO2 30 09/25/2013

## 2013-10-12 ENCOUNTER — Encounter: Payer: Self-pay | Admitting: Internal Medicine

## 2013-10-21 ENCOUNTER — Encounter: Payer: Self-pay | Admitting: Internal Medicine

## 2013-10-21 ENCOUNTER — Ambulatory Visit (INDEPENDENT_AMBULATORY_CARE_PROVIDER_SITE_OTHER): Payer: BC Managed Care – PPO | Admitting: Internal Medicine

## 2013-10-21 VITALS — BP 110/80 | HR 89 | Temp 97.9°F | Wt 137.0 lb

## 2013-10-21 DIAGNOSIS — R5381 Other malaise: Secondary | ICD-10-CM

## 2013-10-21 DIAGNOSIS — R5383 Other fatigue: Secondary | ICD-10-CM

## 2013-10-21 LAB — COMPREHENSIVE METABOLIC PANEL
ALBUMIN: 4.6 g/dL (ref 3.5–5.2)
ALT: 17 U/L (ref 0–35)
AST: 18 U/L (ref 0–37)
Alkaline Phosphatase: 54 U/L (ref 39–117)
BUN: 14 mg/dL (ref 6–23)
CALCIUM: 9.6 mg/dL (ref 8.4–10.5)
CHLORIDE: 104 meq/L (ref 96–112)
CO2: 27 meq/L (ref 19–32)
CREATININE: 0.8 mg/dL (ref 0.4–1.2)
GFR: 89.27 mL/min (ref >60.00)
Glucose, Bld: 94 mg/dL (ref 70–99)
POTASSIUM: 4.1 meq/L (ref 3.5–5.1)
Sodium: 138 mEq/L (ref 135–145)
Total Bilirubin: 0.9 mg/dL (ref 0.3–1.2)
Total Protein: 7.2 g/dL (ref 6.0–8.3)

## 2013-10-21 LAB — CBC WITH DIFFERENTIAL/PLATELET
BASOS ABS: 0 10*3/uL (ref 0.0–0.1)
BASOS PCT: 0.8 % (ref 0.0–3.0)
EOS ABS: 0.1 10*3/uL (ref 0.0–0.7)
Eosinophils Relative: 1 % (ref 0.0–5.0)
HCT: 43.8 % (ref 36.0–46.0)
Hemoglobin: 15 g/dL (ref 12.0–15.0)
LYMPHS PCT: 32.3 % (ref 12.0–46.0)
Lymphs Abs: 1.8 10*3/uL (ref 0.7–4.0)
MCHC: 34.2 g/dL (ref 30.0–36.0)
MCV: 95.4 fl (ref 78.0–100.0)
MONOS PCT: 7.9 % (ref 3.0–12.0)
Monocytes Absolute: 0.4 10*3/uL (ref 0.1–1.0)
NEUTROS PCT: 58 % (ref 43.0–77.0)
Neutro Abs: 3.2 10*3/uL (ref 1.4–7.7)
Platelets: 212 10*3/uL (ref 150.0–400.0)
RBC: 4.59 Mil/uL (ref 3.87–5.11)
RDW: 12.2 % (ref 11.5–14.6)
WBC: 5.6 10*3/uL (ref 4.5–10.5)

## 2013-10-21 LAB — FERRITIN: Ferritin: 37.9 ng/mL (ref 10.0–291.0)

## 2013-10-21 LAB — TSH: TSH: 0.75 u[IU]/mL (ref 0.35–5.50)

## 2013-10-21 LAB — VITAMIN B12: Vitamin B-12: 553 pg/mL (ref 211–911)

## 2013-10-21 LAB — T4, FREE: Free T4: 0.76 ng/dL (ref 0.60–1.60)

## 2013-10-21 NOTE — Assessment & Plan Note (Signed)
Symptoms of generalized fatigue and malaise after recent influenza. Will check CBC, CMP, TSH with labs today. We reviewed her questions about MTHFR gene mutation. She has no evidence to suggest this, and current guidelines recommend against screening asymptomatic individuals. Encouraged her to continue with integrative therapies for pain management. Follow up 2-4 weeks and prn.

## 2013-10-21 NOTE — Progress Notes (Signed)
Subjective:    Patient ID: Alejandra Cook, female    DOB: May 16, 1981, 33 y.o.   MRN: [id number]  HPI [name] presents for acute visit.  It has been a very difficult time for her. She was recently found to have IUD outside of the uterus and had to have laparoscopic intervention with removal of the IUD.  She then had a miscarriage and had to have a D+E.  Recently, in February, she had influenza. Since that time, she reports feeling exhausted. She notes some issues with chronic diffuse aching pain. She attributes this to fibromyalgia. She is following with an [facility] in [place]. She heard from a friend that she might have MTHFR gene mutation leading to her symptoms. She denies any personal or family history of blood clots.   Review of Systems  Constitutional: Positive for fatigue. Negative for fever, chills, appetite change and unexpected weight change.  HENT: Negative for congestion, ear pain, sinus pressure, sore throat, trouble swallowing and voice change.   Eyes: Negative for visual disturbance.  Respiratory: Negative for cough, shortness of breath, wheezing and stridor.   Cardiovascular: Negative for chest pain, palpitations and leg swelling.  Gastrointestinal: Negative for nausea, vomiting, abdominal pain, diarrhea, constipation, blood in stool, abdominal distention and anal bleeding.  Genitourinary: Negative for dysuria and flank pain.  Musculoskeletal: Positive for arthralgias and myalgias. Negative for gait problem and neck pain.  Skin: Negative for color change and rash.  Neurological: Negative for dizziness and headaches.  Hematological: Negative for adenopathy. Does not bruise/bleed easily.  Psychiatric/Behavioral: Negative for suicidal ideas, sleep disturbance and dysphoric mood. The patient is not nervous/anxious.        Objective:    BP 110/80  Pulse 89  Temp(Src) 97.9 F (36.6 C) (Oral)  Wt 137 lb (62.143 kg)  SpO2 98%  LMP  09/20/2013 Physical Exam  Constitutional: She is oriented to person, place, and time. She appears well-developed and well-nourished. No distress.  HENT:  Head: Normocephalic and atraumatic.  Right Ear: External ear normal.  Left Ear: External ear normal.  Nose: Nose normal.  Mouth/Throat: Oropharynx is clear and moist. No oropharyngeal exudate.  Eyes: Conjunctivae are normal. Pupils are equal, round, and reactive to light. Right eye exhibits no discharge. Left eye exhibits no discharge. No scleral icterus.  Neck: Normal range of motion. Neck supple. No tracheal deviation present. No thyromegaly present.  Cardiovascular: Normal rate, regular rhythm, normal heart sounds and intact distal pulses.  Exam reveals no gallop and no friction rub.   No murmur heard. Pulmonary/Chest: Effort normal and breath sounds normal. No accessory muscle usage. Not tachypneic. No respiratory distress. She has no decreased breath sounds. She has no wheezes. She has no rhonchi. She has no rales. She exhibits no tenderness.  Musculoskeletal: Normal range of motion. She exhibits no edema and no tenderness.  Lymphadenopathy:    She has no cervical adenopathy.  Neurological: She is alert and oriented to person, place, and time. No cranial nerve deficit. She exhibits normal muscle tone. Coordination normal.  Skin: Skin is warm and dry. No rash noted. She is not diaphoretic. No erythema. No pallor.  Psychiatric: Her behavior is normal. Judgment and thought content normal. She exhibits a depressed mood.          Assessment & Plan:   Problem List Items Addressed This Visit   Other malaise and fatigue - Primary     Symptoms of generalized fatigue and malaise after recent influenza.  Will check CBC, CMP, TSH with labs today. We reviewed her questions about MTHFR gene mutation. She has no evidence to suggest this, and current guidelines recommend against screening asymptomatic individuals. Encouraged her to continue with  integrative therapies for pain management. Follow up 2-4 weeks and prn.    Relevant Orders      Comp Met (CMET)      CBC w/Diff      Ferritin      Methylmalonic Acid      B12      TSH      T4, free      Vit D  25 hydroxy (rtn osteoporosis monitoring)       Return in about 4 weeks (around 11/18/2013).   

## 2013-10-21 NOTE — Progress Notes (Signed)
Pre visit review using our clinic review tool, if applicable. No additional management support is needed unless otherwise documented below in the visit note. 

## 2013-10-22 LAB — VITAMIN D 25 HYDROXY (VIT D DEFICIENCY, FRACTURES): Vit D, 25-Hydroxy: 42 ng/mL (ref 30–89)

## 2013-10-23 LAB — METHYLMALONIC ACID, SERUM: METHYLMALONIC ACID, QUANT: 0.15 umol/L (ref <0.40)

## 2013-10-26 ENCOUNTER — Encounter: Payer: Self-pay | Admitting: Internal Medicine

## 2013-10-27 ENCOUNTER — Ambulatory Visit (INDEPENDENT_AMBULATORY_CARE_PROVIDER_SITE_OTHER): Payer: BC Managed Care – PPO | Admitting: Internal Medicine

## 2013-10-27 ENCOUNTER — Encounter: Payer: Self-pay | Admitting: Internal Medicine

## 2013-10-27 VITALS — BP 108/70 | HR 78 | Temp 98.2°F | Wt 139.0 lb

## 2013-10-27 DIAGNOSIS — G43909 Migraine, unspecified, not intractable, without status migrainosus: Secondary | ICD-10-CM

## 2013-10-27 MED ORDER — ELETRIPTAN HYDROBROMIDE 40 MG PO TABS: 40.0000 mg | ORAL_TABLET | ORAL | Status: DC | PRN

## 2013-10-27 MED ORDER — KETOROLAC TROMETHAMINE 60 MG/2ML IM SOLN
30.0000 mg | Freq: Once | INTRAMUSCULAR | Status: AC
  Administered 2013-10-27: 30 mg via INTRAMUSCULAR

## 2013-10-27 MED ORDER — TRAMADOL HCL 50 MG PO TABS: 100.0000 mg | ORAL_TABLET | Freq: Three times a day (TID) | ORAL | Status: DC | PRN

## 2013-10-27 NOTE — Patient Instructions (Signed)
Email with update tonight or tomorrow.

## 2013-10-27 NOTE — Progress Notes (Signed)
Subjective:    Patient ID: Alejandra Cook, female    DOB: 12-May-1981, 33 y.o.   MRN: [id number]  HPI 32YO female presents for acute visit for worsening headaches. Headaches started last week. Initially dull ache. Took Flexeril with some improvement. Then, severe headache Saturday with photo and phonophobia. Unable to drive. No blurred vision, but occasional flashing spots. Nausea and vomiting. Tried taking Tramadol with no improvement. In the past, tried Imitrex several years ago. But, hasn't used recently because headaches have been well-controlled.   Review of Systems  Constitutional: Negative for fever, chills, appetite change, fatigue and unexpected weight change.  HENT: Negative for congestion, ear pain, sinus pressure, sore throat, trouble swallowing and voice change.   Eyes: Positive for photophobia and visual disturbance.  Respiratory: Negative for cough, shortness of breath, wheezing and stridor.   Cardiovascular: Negative for chest pain, palpitations and leg swelling.  Gastrointestinal: Positive for nausea and vomiting. Negative for abdominal pain, diarrhea, constipation, blood in stool, abdominal distention and anal bleeding.  Genitourinary: Negative for dysuria and flank pain.  Musculoskeletal: Negative for arthralgias, gait problem, myalgias and neck pain.  Skin: Negative for color change and rash.  Neurological: Positive for headaches. Negative for dizziness.  Hematological: Negative for adenopathy. Does not bruise/bleed easily.  Psychiatric/Behavioral: Negative for suicidal ideas, sleep disturbance and dysphoric mood. The patient is not nervous/anxious.        Objective:    BP 108/70  Pulse 78  Temp(Src) 98.2 F (36.8 C) (Oral)  Wt 139 lb (63.05 kg)  SpO2 98%  LMP 10/11/2013 Physical Exam  Constitutional: She is oriented to person, place, and time. She appears well-developed and well-nourished. No distress.  HENT:  Head: Normocephalic and atraumatic.  Right  Ear: External ear normal.  Left Ear: External ear normal.  Nose: Nose normal.  Mouth/Throat: Oropharynx is clear and moist. No oropharyngeal exudate.  Eyes: Conjunctivae are normal. Pupils are equal, round, and reactive to light. Right eye exhibits no discharge. Left eye exhibits no discharge. No scleral icterus.  Neck: Normal range of motion. Neck supple. No tracheal deviation present. No thyromegaly present.  Cardiovascular: Normal rate, regular rhythm, normal heart sounds and intact distal pulses.  Exam reveals no gallop and no friction rub.   No murmur heard. Pulmonary/Chest: Effort normal and breath sounds normal. No accessory muscle usage. Not tachypneic. No respiratory distress. She has no decreased breath sounds. She has no wheezes. She has no rhonchi. She has no rales. She exhibits no tenderness.  Musculoskeletal: Normal range of motion. She exhibits no edema and no tenderness.  Lymphadenopathy:    She has no cervical adenopathy.  Neurological: She is alert and oriented to person, place, and time. No cranial nerve deficit. She exhibits normal muscle tone. Coordination normal.  Skin: Skin is warm and dry. No rash noted. She is not diaphoretic. No erythema. No pallor.  Psychiatric: She has a normal mood and affect. Her behavior is normal. Judgment and thought content normal.          Assessment & Plan:   Problem List Items Addressed This Visit   MIGRAINE - Primary     Recent severe migraine, not responsive to usual therapies including Flexeril and Tramadol. Will give toradol injection today to try to stop migraine headache pain. If no improvement, will try Relpax. We also discussed using Prednisone taper. Follow up by email tonight or tomorrow.    Relevant Medications      traMADol (ULTRAM) 50 MG tablet  eletriptan (RELPAX) tablet       Return if symptoms worsen or fail to improve.

## 2013-10-27 NOTE — Progress Notes (Signed)
Pre visit review using our clinic review tool, if applicable. No additional management support is needed unless otherwise documented below in the visit note. 

## 2013-10-27 NOTE — Assessment & Plan Note (Signed)
Recent severe migraine, not responsive to usual therapies including Flexeril and Tramadol. Will give toradol injection today to try to stop migraine headache pain. If no improvement, will try Relpax. We also discussed using Prednisone taper. Follow up by email tonight or tomorrow.

## 2013-10-27 NOTE — Addendum Note (Signed)
Addended by: Theola SequinLIVER, Braylen Denunzio L on: 10/27/2013 08:41 AM   Modules accepted: Orders

## 2013-10-28 ENCOUNTER — Encounter: Payer: Self-pay | Admitting: Internal Medicine

## 2013-10-29 ENCOUNTER — Encounter: Payer: Self-pay | Admitting: Internal Medicine

## 2013-10-29 ENCOUNTER — Telehealth: Payer: Self-pay | Admitting: Internal Medicine

## 2013-10-29 ENCOUNTER — Other Ambulatory Visit: Payer: Self-pay | Admitting: Internal Medicine

## 2013-10-29 MED ORDER — PREDNISONE 10 MG PO TABS: ORAL_TABLET | ORAL | Status: DC

## 2013-10-29 MED ORDER — SUMATRIPTAN SUCCINATE REFILL 6 MG/0.5ML ~~LOC~~ SOCT: 6.0000 mg | Freq: Once | SUBCUTANEOUS | Status: DC

## 2013-10-29 NOTE — Telephone Encounter (Signed)
Left detailed message on patient voicemail to call back or if she can get here at [phone] then come on in.

## 2013-10-29 NOTE — Telephone Encounter (Signed)
Can you call pt and see if she wants to come in this afternoon for shot of phenergan? Having migraine with nausea at home. We can add at 4:15

## 2013-10-30 ENCOUNTER — Ambulatory Visit (INDEPENDENT_AMBULATORY_CARE_PROVIDER_SITE_OTHER): Payer: BC Managed Care – PPO | Admitting: Internal Medicine

## 2013-10-30 ENCOUNTER — Encounter: Payer: Self-pay | Admitting: Internal Medicine

## 2013-10-30 VITALS — BP 100/70 | HR 107 | Temp 98.5°F | Wt 136.0 lb

## 2013-10-30 DIAGNOSIS — G43909 Migraine, unspecified, not intractable, without status migrainosus: Secondary | ICD-10-CM

## 2013-10-30 MED ORDER — PROMETHAZINE HCL 25 MG/ML IJ SOLN
25.0000 mg | Freq: Once | INTRAMUSCULAR | Status: AC
  Administered 2013-10-30: 25 mg via INTRAMUSCULAR

## 2013-10-30 NOTE — Assessment & Plan Note (Signed)
Recent status migrainosis. Minimal improvement with Imitrex, Toradol. Started prednisone yesterday. Will continue prednisone taper. Will given Phenergan IM today to help with nausea. Plan for follow up with headache specialist next week and here in 1 week and prn. If symptoms worsen over weekend, will likely need IV Phenergan, Reglan and pain medication in the ED.

## 2013-10-30 NOTE — Telephone Encounter (Signed)
Patient has an appointment to come in today to see Dr. Dan HumphreysWalker

## 2013-10-30 NOTE — Progress Notes (Signed)
   Subjective:    Patient ID: Alejandra Cook, female    DOB: [birth year], 33 y.o.   MRN: [id number]  HPI 32YO female with severe migraines presents for acute visit. Severe migraine headache which has persisted for nearly 2 weeks. Seen earlier this week, given Toradol IM. Had some improvement with this, however then headache recurred. Described as diffuse throbbing headache c/w known h/o migraines. Occasional photo and phonophobia. Notes frequent "floaters" in her vision.  Yesterday headache was intense with NV. Had minimal improvement with Imitrex [place]. Started Prednisone taper last night. Headache slightly improved this morning, described as dull ache. Has follow up with headache specialist on Tuesday.     Review of Systems  Constitutional: Negative for fever, chills and fatigue.  Eyes: Positive for photophobia and visual disturbance. Negative for pain and redness.  Respiratory: Negative for shortness of breath.   Cardiovascular: Negative for chest pain.  Gastrointestinal: Positive for nausea and vomiting.  Musculoskeletal: Negative for neck pain and neck stiffness.  Neurological: Positive for headaches. Negative for dizziness.  Psychiatric/Behavioral: Positive for sleep disturbance. Negative for dysphoric mood. The patient is not nervous/anxious.        Objective:    BP 100/70  Pulse 107  Temp(Src) 98.5 F (36.9 C) (Oral)  Wt 136 lb (61.689 kg)  SpO2 97%  LMP 10/11/2013 Physical Exam  Constitutional: She is oriented to person, place, and time. She appears well-developed and well-nourished. No distress.  HENT:  Head: Normocephalic and atraumatic.  Right Ear: External ear normal.  Left Ear: External ear normal.  Nose: Nose normal.  Mouth/Throat: Oropharynx is clear and moist. No oropharyngeal exudate.  Eyes: Conjunctivae are normal. Pupils are equal, round, and reactive to light. Right eye exhibits no discharge. Left eye exhibits no discharge. No scleral icterus.  Neck: Normal  range of motion. Neck supple. No tracheal deviation present. No thyromegaly present.  Cardiovascular: Normal rate, regular rhythm, normal heart sounds and intact distal pulses.  Exam reveals no gallop and no friction rub.   No murmur heard. Pulmonary/Chest: Effort normal and breath sounds normal. No accessory muscle usage. Not tachypneic. No respiratory distress. She has no decreased breath sounds. She has no wheezes. She has no rhonchi. She has no rales. She exhibits no tenderness.  Musculoskeletal: Normal range of motion. She exhibits no edema and no tenderness.  Lymphadenopathy:    She has no cervical adenopathy.  Neurological: She is alert and oriented to person, place, and time. No cranial nerve deficit. She exhibits normal muscle tone. Coordination normal.  Skin: Skin is warm and dry. No rash noted. She is not diaphoretic. No erythema. No pallor.  Psychiatric: She has a normal mood and affect. Her behavior is normal. Judgment and thought content normal.          Assessment & Plan:   Problem List Items Addressed This Visit   MIGRAINE - Primary     Recent status migrainosis. Minimal improvement with Imitrex, Toradol. Started prednisone yesterday. Will continue prednisone taper. Will given Phenergan IM today to help with nausea. Plan for follow up with headache specialist next week and here in 1 week and prn. If symptoms worsen over weekend, will likely need IV Phenergan, Reglan and pain medication in the ED.        Return in about 1 week (around 11/06/2013) for Recheck.

## 2013-10-30 NOTE — Addendum Note (Signed)
Addended by: Theola SequinLIVER, Heavenleigh Petruzzi L on: 10/30/2013 02:24 PM   Modules accepted: Orders

## 2014-01-22 ENCOUNTER — Encounter: Payer: BC Managed Care – PPO | Admitting: Internal Medicine

## 2014-03-16 ENCOUNTER — Encounter: Payer: Self-pay | Admitting: Internal Medicine

## 2014-04-28 ENCOUNTER — Ambulatory Visit (INDEPENDENT_AMBULATORY_CARE_PROVIDER_SITE_OTHER): Payer: BC Managed Care – PPO | Admitting: *Deleted

## 2014-04-28 DIAGNOSIS — Z111 Encounter for screening for respiratory tuberculosis: Secondary | ICD-10-CM

## 2014-04-28 DIAGNOSIS — Z23 Encounter for immunization: Secondary | ICD-10-CM

## 2014-04-28 DIAGNOSIS — Z0184 Encounter for antibody response examination: Secondary | ICD-10-CM

## 2014-04-28 NOTE — Progress Notes (Signed)
Pt given completed form. Dr. Gilford Rile had recommended Hep B surface antibody and MMR titer. Pt agreed to have these drawn today. Advised to return 04/30/14 for TB reading and to bring form back for completion.

## 2014-04-29 ENCOUNTER — Encounter: Payer: Self-pay | Admitting: Internal Medicine

## 2014-04-29 LAB — MEASLES/MUMPS/RUBELLA IMMUNITY
MUMPS IGG: 61.9 [AU]/ml — AB (ref <9.00)
RUBELLA: 3.94 {index} — AB (ref <0.90)
Rubeola IgG: >300 AU/mL — ABNORMAL HIGH (ref <25.00)

## 2014-04-29 LAB — HEPATITIS B SURFACE ANTIBODY,QUALITATIVE: Hep B S Ab: NEGATIVE

## 2014-04-30 ENCOUNTER — Ambulatory Visit (INDEPENDENT_AMBULATORY_CARE_PROVIDER_SITE_OTHER): Payer: BC Managed Care – PPO | Admitting: *Deleted

## 2014-04-30 DIAGNOSIS — Z23 Encounter for immunization: Secondary | ICD-10-CM

## 2014-04-30 LAB — TB SKIN TEST
Induration: 0 mm
TB Skin Test: NEGATIVE

## 2014-05-01 ENCOUNTER — Ambulatory Visit: Payer: BC Managed Care – PPO

## 2014-06-01 ENCOUNTER — Ambulatory Visit (INDEPENDENT_AMBULATORY_CARE_PROVIDER_SITE_OTHER): Payer: BC Managed Care – PPO | Admitting: *Deleted

## 2014-06-01 DIAGNOSIS — Z23 Encounter for immunization: Secondary | ICD-10-CM

## 2014-06-30 LAB — OB RESULTS CONSOLE GC/CHLAMYDIA
CHLAMYDIA, DNA PROBE: NEGATIVE
Gonorrhea: NEGATIVE

## 2014-06-30 LAB — OB RESULTS CONSOLE HEPATITIS B SURFACE ANTIGEN: Hepatitis B Surface Ag: NEGATIVE

## 2014-06-30 LAB — OB RESULTS CONSOLE HIV ANTIBODY (ROUTINE TESTING): HIV: NONREACTIVE

## 2014-06-30 LAB — OB RESULTS CONSOLE RPR: RPR: NONREACTIVE

## 2014-06-30 LAB — OB RESULTS CONSOLE ABO/RH: RH Type: POSITIVE

## 2014-06-30 LAB — OB RESULTS CONSOLE ANTIBODY SCREEN: ANTIBODY SCREEN: NEGATIVE

## 2014-06-30 LAB — OB RESULTS CONSOLE RUBELLA ANTIBODY, IGM: RUBELLA: IMMUNE

## 2014-07-22 ENCOUNTER — Encounter: Payer: Self-pay | Admitting: Internal Medicine

## 2014-07-22 ENCOUNTER — Ambulatory Visit (INDEPENDENT_AMBULATORY_CARE_PROVIDER_SITE_OTHER): Payer: BC Managed Care – PPO | Admitting: Internal Medicine

## 2014-07-22 VITALS — BP 106/58 | HR 96 | Temp 98.4°F | Wt 130.0 lb

## 2014-07-22 DIAGNOSIS — B349 Viral infection, unspecified: Secondary | ICD-10-CM

## 2014-07-22 DIAGNOSIS — J329 Chronic sinusitis, unspecified: Secondary | ICD-10-CM

## 2014-07-22 DIAGNOSIS — B9789 Other viral agents as the cause of diseases classified elsewhere: Secondary | ICD-10-CM

## 2014-07-22 MED ORDER — AMOXICILLIN 500 MG PO CAPS: 500.0000 mg | ORAL_CAPSULE | Freq: Three times a day (TID) | ORAL | Status: DC

## 2014-07-22 MED ORDER — FLUTICASONE PROPIONATE 50 MCG/ACT NA SUSP: 2.0000 | Freq: Every day | NASAL | Status: DC

## 2014-07-22 NOTE — Progress Notes (Signed)
Pre visit review using our clinic review tool, if applicable. No additional management support is needed unless otherwise documented below in the visit note. 

## 2014-07-22 NOTE — Patient Instructions (Signed)

## 2014-07-22 NOTE — Progress Notes (Signed)
HPI  Pt presents to the clinic today with c/o facial pain and pressure, ear pressure, sore throat and cough. She reports this started 2 days ago. She has had some associated difficulty swallowing. She is not blowing anything ou of her nose. The cough is not productive. She denies fever, but has had chills and body aches. She has not taken anything OTC. She has no history of allergies or breathing problems. She has had sick contacts.  Review of Systems    Past Medical History  Diagnosis Date  . BMWUXLKG(401.0Headache(784.0)     Dr. [facility]eman at [facility]  . Degenerative disc disease, lumbar   . Bulging discs   . Fibromyalgia     after pregnancy, Followed by [facility]  . Allergy     Dr. Gwen PoundsKowalski, gold and nickel allergy  . Spontaneous abortion in second trimester Dec 2014    Family History  Problem Relation Age of Onset  . Hyperlipidemia Mother   . Hypertension Father   . Cancer Maternal Grandmother     cancer, breast  . Cancer Paternal Grandmother     breast    History   Social History  . Marital Status: Married    Spouse Name: N/A    Number of Children: N/A  . Years of Education: N/A   Occupational History  . Not on file.   Social History Main Topics  . Smoking status: Never Smoker   . Smokeless tobacco: Never Used  . Alcohol Use: No  . Drug Use: No  . Sexual Activity: Yes   Other Topics Concern  . Not on file   Social History Narrative   Lives in [place]. 2YO daughter.      Works- Cabin crewspecial ED, [employer]    No Known Allergies   Constitutional: Positive headache, fatigue. Denies fever or abrupt weight changes.  HEENT:  Positive facial pain, nasal congestion, ear fullness and sore throat. Denies eye redness, ear pain, ringing in the ears, wax buildup, runny nose or bloody nose. Respiratory: Positive cough. Denies difficulty breathing or shortness of breath.  Cardiovascular: Denies chest pain, chest tightness, palpitations or swelling in the hands or  feet.   No other specific complaints in a complete review of systems (except as listed in HPI above).  Objective:  BP 106/58 mmHg  Pulse 96  Temp(Src) 98.4 F (36.9 C) (Oral)  Wt 130 lb (58.968 kg)  SpO2 98%  LMP 10/11/2013   General: Appears her stated age, in NAD. HEENT: Head: normal shape and size, right maxillary sinus tenderness noted; Eyes: sclera white, no icterus, conjunctiva pink; Ears: Tm's pink but intact, normal light reflex; Nose: mucosa pink and moist, septum midline; Throat/Mouth: + PND. Teeth present, mucosa erythematous and moist, no exudate noted, no lesions or ulcerations noted.  Neck: No adenopathy noted Cardiovascular: Normal rate and rhythm. S1,S2 noted.  No murmur, rubs or gallops noted.  Pulmonary/Chest: Normal effort and positive vesicular breath sounds. No respiratory distress. No wheezes, rales or ronchi noted.      Assessment & Plan:   Viral sinusitis  Can use a Neti Pot which can be purchased from your local drug store. Flonase 2 sprays each nostril for 3 days and then as needed. Will print RX for Amoxil TID x 10 days if symptoms worsen over the weekend- pregnancy category B, discussed this with pt, she understands risks and agrees  RTC as needed or if symptoms persist.

## 2014-08-10 ENCOUNTER — Encounter: Payer: Self-pay | Admitting: Internal Medicine

## 2014-08-11 ENCOUNTER — Ambulatory Visit (INDEPENDENT_AMBULATORY_CARE_PROVIDER_SITE_OTHER): Payer: BC Managed Care – PPO | Admitting: Internal Medicine

## 2014-08-11 ENCOUNTER — Ambulatory Visit: Payer: BC Managed Care – PPO | Admitting: Nurse Practitioner

## 2014-08-11 ENCOUNTER — Encounter: Payer: Self-pay | Admitting: Internal Medicine

## 2014-08-11 VITALS — BP 118/83 | HR 67 | Temp 98.1°F | Ht 63.0 in | Wt 135.5 lb

## 2014-08-11 DIAGNOSIS — J209 Acute bronchitis, unspecified: Secondary | ICD-10-CM

## 2014-08-11 DIAGNOSIS — J40 Bronchitis, not specified as acute or chronic: Secondary | ICD-10-CM | POA: Insufficient documentation

## 2014-08-11 DIAGNOSIS — R0600 Dyspnea, unspecified: Secondary | ICD-10-CM | POA: Insufficient documentation

## 2014-08-11 MED ORDER — AZITHROMYCIN 250 MG PO TABS: ORAL_TABLET | ORAL | Status: DC

## 2014-08-11 NOTE — Progress Notes (Signed)
Pre visit review using our clinic review tool, if applicable. No additional management support is needed unless otherwise documented below in the visit note. 

## 2014-08-11 NOTE — Assessment & Plan Note (Signed)
Symptoms and exam consistent with acute bronchitis. Will start Azithromycin for atypical coverage, given previous treatment with amoxicillin. Will use benadryl and Robitussin DM for cough.

## 2014-08-11 NOTE — Progress Notes (Signed)
Subjective:    Patient ID: Alejandra Cook, female    DOB: 05-Jul-1981, 34 y.o.   MRN: [id number]  HPI [name] presents for acute visit.  Treated with amoxicillin 12/17 for sinus infection. Pressure and sinus congestion improved, however now has cough with occasional mucous. Feels short of breath at times. No chest pain. Fever off and on 65F or so. Taking hot tea for cough.  Pregnant [redacted]weeks. Has not contacted OB with current symptoms.   Past medical, surgical, family and social history per today's encounter.  Review of Systems  Constitutional: Positive for fever and fatigue. Negative for chills and unexpected weight change.  HENT: Positive for postnasal drip. Negative for congestion, ear discharge, ear pain, facial swelling, hearing loss, mouth sores, nosebleeds, rhinorrhea, sinus pressure, sneezing, sore throat, tinnitus, trouble swallowing and voice change.   Eyes: Negative for pain, discharge, redness and visual disturbance.  Respiratory: Positive for cough and shortness of breath. Negative for chest tightness, wheezing and stridor.   Cardiovascular: Negative for chest pain, palpitations and leg swelling.  Musculoskeletal: Negative for myalgias, arthralgias, neck pain and neck stiffness.  Skin: Negative for color change and rash.  Neurological: Negative for dizziness, weakness, light-headedness and headaches.  Hematological: Negative for adenopathy.       Objective:    BP 118/83 mmHg  Pulse 67  Temp(Src) 98.1 F (36.7 C) (Oral)  Ht  (1.6 m)  Wt 135 lb 8 oz (61.462 kg)  BMI 24.01 kg/m2  SpO2 100%  LMP 10/11/2013 Physical Exam  Constitutional: She is oriented to person, place, and time. She appears well-developed and well-nourished. No distress.  HENT:  Head: Normocephalic and atraumatic.  Right Ear: External ear normal.  Left Ear: External ear normal.  Nose: Nose normal.  Mouth/Throat: Oropharynx is clear and moist. No oropharyngeal exudate.  Eyes:  Conjunctivae are normal. Pupils are equal, round, and reactive to light. Right eye exhibits no discharge. Left eye exhibits no discharge. No scleral icterus.  Neck: Normal range of motion. Neck supple. No tracheal deviation present. No thyromegaly present.  Cardiovascular: Normal rate, regular rhythm, normal heart sounds and intact distal pulses.  Exam reveals no gallop and no friction rub.   No murmur heard. Pulmonary/Chest: Effort normal and breath sounds normal. No accessory muscle usage. No tachypnea. No respiratory distress. She has no decreased breath sounds. She has no wheezes. She has no rhonchi. She has no rales. She exhibits no tenderness.  Musculoskeletal: Normal range of motion. She exhibits no edema or tenderness.  Lymphadenopathy:    She has no cervical adenopathy.  Neurological: She is alert and oriented to person, place, and time. No cranial nerve deficit. She exhibits normal muscle tone. Coordination normal.  Skin: Skin is warm and dry. No rash noted. She is not diaphoretic. No erythema. No pallor.  Psychiatric: She has a normal mood and affect. Her behavior is normal. Judgment and thought content normal.          Assessment & Plan:   Problem List Items Addressed This Visit      Unprioritized   Acute bronchitis - Primary    Symptoms and exam consistent with acute bronchitis. Will start Azithromycin for atypical coverage, given previous treatment with amoxicillin. Will use benadryl and Robitussin DM for cough.     Relevant Medications      azithromycin (ZITHROMAX) tablet   Other Relevant Orders      CBC w/Diff      D-Dimer, Quantitative   Dyspnea  Her complaints of dyspnea seem out of proportion to exam. Risk for PE in pregnancy. Will check D-dimer with labs, however suspicion for this is low. Most likely related to bronchospasm and mucous production from acute bronchitis.    Relevant Orders      D-Dimer, Quantitative       Return in about 2 weeks (around  08/25/2014) for Recheck.

## 2014-08-11 NOTE — Assessment & Plan Note (Signed)
Her complaints of dyspnea seem out of proportion to exam. Risk for PE in pregnancy. Will check D-dimer with labs, however suspicion for this is low. Most likely related to bronchospasm and mucous production from acute bronchitis.

## 2014-08-11 NOTE — Patient Instructions (Signed)
Use antihistamine such as Benadryl and OTC Dextromethorphan to help control cough.  Labs today.  Start Azithromycin.  Follow up in 2 weeks or sooner as needed.

## 2014-08-12 LAB — CBC WITH DIFFERENTIAL/PLATELET
Basophils Absolute: 0.1 10*3/uL (ref 0.0–0.1)
Basophils Relative: 0.9 % (ref 0.0–3.0)
EOS ABS: 0 10*3/uL (ref 0.0–0.7)
Eosinophils Relative: 0.5 % (ref 0.0–5.0)
HEMATOCRIT: 38.9 % (ref 36.0–46.0)
HEMOGLOBIN: 13.1 g/dL (ref 12.0–15.0)
Lymphocytes Relative: 24.4 % (ref 12.0–46.0)
Lymphs Abs: 2.1 10*3/uL (ref 0.7–4.0)
MCHC: 33.6 g/dL (ref 30.0–36.0)
MCV: 94.3 fl (ref 78.0–100.0)
MONO ABS: 0.4 10*3/uL (ref 0.1–1.0)
Monocytes Relative: 4.5 % (ref 3.0–12.0)
NEUTROS ABS: 6.1 10*3/uL (ref 1.4–7.7)
Neutrophils Relative %: 69.7 % (ref 43.0–77.0)
PLATELETS: 201 10*3/uL (ref 150.0–400.0)
RBC: 4.12 Mil/uL (ref 3.87–5.11)
RDW: 13.6 % (ref 11.5–15.5)
WBC: 8.7 10*3/uL (ref 4.0–10.5)

## 2014-08-12 LAB — D-DIMER, QUANTITATIVE: D-Dimer, Quant: 0.29 ug/mL-FEU (ref 0.00–0.48)

## 2014-10-29 ENCOUNTER — Ambulatory Visit: Payer: BC Managed Care – PPO

## 2014-11-02 ENCOUNTER — Ambulatory Visit (INDEPENDENT_AMBULATORY_CARE_PROVIDER_SITE_OTHER): Payer: BC Managed Care – PPO

## 2014-11-02 DIAGNOSIS — Z23 Encounter for immunization: Secondary | ICD-10-CM

## 2015-01-05 ENCOUNTER — Encounter (HOSPITAL_COMMUNITY): Payer: Self-pay | Admitting: *Deleted

## 2015-01-05 ENCOUNTER — Telehealth (HOSPITAL_COMMUNITY): Payer: Self-pay | Admitting: *Deleted

## 2015-01-05 NOTE — Telephone Encounter (Signed)
Preadmission screen  

## 2015-01-07 LAB — OB RESULTS CONSOLE GBS: GBS: NEGATIVE

## 2015-01-09 ENCOUNTER — Inpatient Hospital Stay (HOSPITAL_COMMUNITY)
Admission: RE | Admit: 2015-01-09 | Discharge: 2015-01-12 | DRG: 775 | Disposition: A | Discharge status: Home / Self Care | Payer: BC Managed Care – PPO | Source: Ambulatory Visit | Attending: Obstetrics and Gynecology | Admitting: Obstetrics and Gynecology

## 2015-01-09 ENCOUNTER — Encounter (HOSPITAL_COMMUNITY): Payer: Self-pay

## 2015-01-09 DIAGNOSIS — M5136 Other intervertebral disc degeneration, lumbar region: Secondary | ICD-10-CM | POA: Diagnosis present

## 2015-01-09 DIAGNOSIS — K831 Obstruction of bile duct: Secondary | ICD-10-CM | POA: Diagnosis present

## 2015-01-09 DIAGNOSIS — O2662 Liver and biliary tract disorders in childbirth: Principal | ICD-10-CM | POA: Diagnosis present

## 2015-01-09 DIAGNOSIS — K589 Irritable bowel syndrome without diarrhea: Secondary | ICD-10-CM | POA: Diagnosis present

## 2015-01-09 DIAGNOSIS — F419 Anxiety disorder, unspecified: Secondary | ICD-10-CM | POA: Diagnosis present

## 2015-01-09 DIAGNOSIS — Z91048 Other nonmedicinal substance allergy status: Secondary | ICD-10-CM

## 2015-01-09 DIAGNOSIS — O99344 Other mental disorders complicating childbirth: Secondary | ICD-10-CM | POA: Diagnosis present

## 2015-01-09 DIAGNOSIS — O9962 Diseases of the digestive system complicating childbirth: Secondary | ICD-10-CM | POA: Diagnosis present

## 2015-01-09 DIAGNOSIS — R51 Headache: Secondary | ICD-10-CM | POA: Diagnosis present

## 2015-01-09 DIAGNOSIS — M797 Fibromyalgia: Secondary | ICD-10-CM | POA: Diagnosis present

## 2015-01-09 DIAGNOSIS — O26619 Liver and biliary tract disorders in pregnancy, unspecified trimester: Secondary | ICD-10-CM | POA: Diagnosis present

## 2015-01-09 DIAGNOSIS — F329 Major depressive disorder, single episode, unspecified: Secondary | ICD-10-CM | POA: Diagnosis present

## 2015-01-09 DIAGNOSIS — Z3A36 36 weeks gestation of pregnancy: Secondary | ICD-10-CM | POA: Diagnosis present

## 2015-01-09 DIAGNOSIS — O9989 Other specified diseases and conditions complicating pregnancy, childbirth and the puerperium: Secondary | ICD-10-CM | POA: Diagnosis present

## 2015-01-09 DIAGNOSIS — O26613 Liver and biliary tract disorders in pregnancy, third trimester: Secondary | ICD-10-CM | POA: Diagnosis present

## 2015-01-09 DIAGNOSIS — O26649 Intrahepatic cholestasis of pregnancy, unspecified trimester: Secondary | ICD-10-CM | POA: Diagnosis present

## 2015-01-09 LAB — COMPREHENSIVE METABOLIC PANEL
ALBUMIN: 3.1 g/dL — AB (ref 3.5–5.0)
ALK PHOS: 81 U/L (ref 38–126)
ALT: 12 U/L — AB (ref 14–54)
AST: 22 U/L (ref 15–41)
Anion gap: 7 (ref 5–15)
BUN: 9 mg/dL (ref 6–20)
CALCIUM: 8.8 mg/dL — AB (ref 8.9–10.3)
CO2: 21 mmol/L — AB (ref 22–32)
CREATININE: 0.58 mg/dL (ref 0.44–1.00)
Chloride: 108 mmol/L (ref 101–111)
GFR calc Af Amer: >60 mL/min (ref >60)
GLUCOSE: 89 mg/dL (ref 65–99)
Potassium: 3.7 mmol/L (ref 3.5–5.1)
Sodium: 136 mmol/L (ref 135–145)
TOTAL PROTEIN: 5.7 g/dL — AB (ref 6.5–8.1)
Total Bilirubin: 0.4 mg/dL (ref 0.3–1.2)

## 2015-01-09 LAB — CBC
HCT: 36.1 % (ref 36.0–46.0)
Hemoglobin: 12.6 g/dL (ref 12.0–15.0)
MCH: 31.6 pg (ref 26.0–34.0)
MCHC: 34.9 g/dL (ref 30.0–36.0)
MCV: 90.5 fL (ref 78.0–100.0)
Platelets: 195 10*3/uL (ref 150–400)
RBC: 3.99 MIL/uL (ref 3.87–5.11)
RDW: 13.4 % (ref 11.5–15.5)
WBC: 11.7 10*3/uL — ABNORMAL HIGH (ref 4.0–10.5)

## 2015-01-09 LAB — TYPE AND SCREEN
ABO/RH(D): AB POS
Antibody Screen: NEGATIVE

## 2015-01-09 MED ORDER — CITRIC ACID-SODIUM CITRATE 334-500 MG/5ML PO SOLN: 30.0000 mL | ORAL | Status: DC | PRN

## 2015-01-09 MED ORDER — LACTATED RINGERS IV SOLN: 500.0000 mL | INTRAVENOUS | Status: DC | PRN

## 2015-01-09 MED ORDER — MISOPROSTOL 25 MCG QUARTER TABLET
25.0000 ug | ORAL_TABLET | ORAL | Status: DC | PRN
  Administered 2015-01-09: 25 ug via VAGINAL
  Filled 2015-01-09: qty 0.25

## 2015-01-09 MED ORDER — FLEET ENEMA 7-19 GM/118ML RE ENEM: 1.0000 | ENEMA | RECTAL | Status: DC | PRN

## 2015-01-09 MED ORDER — ACETAMINOPHEN 325 MG PO TABS: 650.0000 mg | ORAL_TABLET | ORAL | Status: DC | PRN

## 2015-01-09 MED ORDER — OXYCODONE-ACETAMINOPHEN 5-325 MG PO TABS
2.0000 | ORAL_TABLET | ORAL | Status: DC | PRN
Start: 2015-01-09 — End: 2015-01-10

## 2015-01-09 MED ORDER — LIDOCAINE HCL (PF) 1 % IJ SOLN: 30.0000 mL | INTRAMUSCULAR | Status: DC | PRN

## 2015-01-09 MED ORDER — TERBUTALINE SULFATE 1 MG/ML IJ SOLN: 0.2500 mg | Freq: Once | INTRAMUSCULAR | Status: AC | PRN

## 2015-01-09 MED ORDER — BUTORPHANOL TARTRATE 1 MG/ML IJ SOLN
1.0000 mg | INTRAMUSCULAR | Status: DC | PRN
  Administered 2015-01-10: 1 mg via INTRAVENOUS
  Filled 2015-01-09: qty 1

## 2015-01-09 MED ORDER — OXYTOCIN BOLUS FROM INFUSION: 500.0000 mL | INTRAVENOUS | Status: DC

## 2015-01-09 MED ORDER — LACTATED RINGERS IV SOLN
INTRAVENOUS | Status: DC
  Administered 2015-01-09 – 2015-01-10 (×2): via INTRAVENOUS

## 2015-01-09 MED ORDER — ZOLPIDEM TARTRATE 5 MG PO TABS: 5.0000 mg | ORAL_TABLET | Freq: Every evening | ORAL | Status: DC | PRN

## 2015-01-09 MED ORDER — OXYCODONE-ACETAMINOPHEN 5-325 MG PO TABS
1.0000 | ORAL_TABLET | ORAL | Status: DC | PRN
Start: 2015-01-09 — End: 2015-01-10

## 2015-01-09 MED ORDER — OXYTOCIN 40 UNITS IN LACTATED RINGERS INFUSION - SIMPLE MED
62.5000 mL/h | INTRAVENOUS | Status: DC
  Administered 2015-01-10: 62.5 mL/h via INTRAVENOUS
  Administered 2015-01-10: 999 mL/h via INTRAVENOUS
  Filled 2015-01-09: qty 1000

## 2015-01-09 MED ORDER — ONDANSETRON HCL 4 MG/2ML IJ SOLN
4.0000 mg | Freq: Four times a day (QID) | INTRAMUSCULAR | Status: DC | PRN
  Administered 2015-01-10: 4 mg via INTRAVENOUS
  Filled 2015-01-09: qty 2

## 2015-01-10 ENCOUNTER — Encounter (HOSPITAL_COMMUNITY): Payer: Self-pay

## 2015-01-10 ENCOUNTER — Inpatient Hospital Stay (HOSPITAL_COMMUNITY): Payer: BC Managed Care – PPO | Admitting: Anesthesiology

## 2015-01-10 LAB — RPR: RPR: NONREACTIVE

## 2015-01-10 LAB — ABO/RH: ABO/RH(D): AB POS

## 2015-01-10 MED ORDER — IBUPROFEN 600 MG PO TABS
600.0000 mg | ORAL_TABLET | Freq: Four times a day (QID) | ORAL | Status: DC
  Administered 2015-01-10 – 2015-01-12 (×8): 600 mg via ORAL
  Filled 2015-01-10 (×8): qty 1

## 2015-01-10 MED ORDER — ONDANSETRON HCL 4 MG PO TABS: 4.0000 mg | ORAL_TABLET | ORAL | Status: DC | PRN

## 2015-01-10 MED ORDER — BISACODYL 10 MG RE SUPP: 10.0000 mg | Freq: Every day | RECTAL | Status: DC | PRN

## 2015-01-10 MED ORDER — FENTANYL 2.5 MCG/ML BUPIVACAINE 1/10 % EPIDURAL INFUSION (WH - ANES): 12.0000 mL/h | INTRAMUSCULAR | Status: DC | PRN

## 2015-01-10 MED ORDER — DIPHENHYDRAMINE HCL 50 MG/ML IJ SOLN: 12.5000 mg | INTRAMUSCULAR | Status: DC | PRN

## 2015-01-10 MED ORDER — TETANUS-DIPHTH-ACELL PERTUSSIS 5-2.5-18.5 LF-MCG/0.5 IM SUSP: 0.5000 mL | Freq: Once | INTRAMUSCULAR | Status: DC

## 2015-01-10 MED ORDER — FLEET ENEMA 7-19 GM/118ML RE ENEM: 1.0000 | ENEMA | Freq: Every day | RECTAL | Status: DC | PRN

## 2015-01-10 MED ORDER — ACETAMINOPHEN 325 MG PO TABS: 650.0000 mg | ORAL_TABLET | ORAL | Status: DC | PRN

## 2015-01-10 MED ORDER — DIPHENHYDRAMINE HCL 25 MG PO CAPS
25.0000 mg | ORAL_CAPSULE | Freq: Four times a day (QID) | ORAL | Status: DC | PRN
Start: 2015-01-10 — End: 2015-01-12
  Administered 2015-01-11: 25 mg via ORAL
  Filled 2015-01-10: qty 1

## 2015-01-10 MED ORDER — PRENATAL MULTIVITAMIN CH
1.0000 | ORAL_TABLET | Freq: Every day | ORAL | Status: DC
  Administered 2015-01-11 – 2015-01-12 (×2): 1 via ORAL
  Filled 2015-01-10 (×2): qty 1

## 2015-01-10 MED ORDER — EPHEDRINE 5 MG/ML INJ: 10.0000 mg | INTRAVENOUS | Status: DC | PRN

## 2015-01-10 MED ORDER — SENNOSIDES-DOCUSATE SODIUM 8.6-50 MG PO TABS
2.0000 | ORAL_TABLET | ORAL | Status: DC
  Administered 2015-01-11 (×2): 2 via ORAL
  Filled 2015-01-10 (×2): qty 2

## 2015-01-10 MED ORDER — LIDOCAINE HCL (PF) 1 % IJ SOLN
INTRAMUSCULAR | Status: DC | PRN
  Administered 2015-01-10 (×2): 3 mL

## 2015-01-10 MED ORDER — DIPHENHYDRAMINE HCL 50 MG/ML IJ SOLN
12.5000 mg | INTRAMUSCULAR | Status: DC | PRN
  Administered 2015-01-10: 12.5 mg via INTRAVENOUS
  Filled 2015-01-10: qty 1

## 2015-01-10 MED ORDER — OXYCODONE-ACETAMINOPHEN 5-325 MG PO TABS
1.0000 | ORAL_TABLET | ORAL | Status: DC | PRN
  Administered 2015-01-11 – 2015-01-12 (×4): 1 via ORAL
  Filled 2015-01-10 (×4): qty 1

## 2015-01-10 MED ORDER — DIBUCAINE 1 % RE OINT: 1.0000 "application " | TOPICAL_OINTMENT | RECTAL | Status: DC | PRN

## 2015-01-10 MED ORDER — ZOLPIDEM TARTRATE 5 MG PO TABS: 5.0000 mg | ORAL_TABLET | Freq: Every evening | ORAL | Status: DC | PRN

## 2015-01-10 MED ORDER — FENTANYL 2.5 MCG/ML BUPIVACAINE 1/10 % EPIDURAL INFUSION (WH - ANES)
14.0000 mL/h | INTRAMUSCULAR | Status: DC | PRN
  Administered 2015-01-10: 12 mL/h via EPIDURAL
  Administered 2015-01-10: 14 mL/h via EPIDURAL
  Filled 2015-01-10: qty 125

## 2015-01-10 MED ORDER — BENZOCAINE-MENTHOL 20-0.5 % EX AERO
1.0000 "application " | INHALATION_SPRAY | CUTANEOUS | Status: DC | PRN
  Administered 2015-01-10: 1 via TOPICAL
  Filled 2015-01-10: qty 56

## 2015-01-10 MED ORDER — PHENYLEPHRINE 40 MCG/ML (10ML) SYRINGE FOR IV PUSH (FOR BLOOD PRESSURE SUPPORT): 80.0000 ug | PREFILLED_SYRINGE | INTRAVENOUS | Status: DC | PRN

## 2015-01-10 MED ORDER — OXYCODONE-ACETAMINOPHEN 5-325 MG PO TABS: 2.0000 | ORAL_TABLET | ORAL | Status: DC | PRN

## 2015-01-10 MED ORDER — ONDANSETRON HCL 4 MG/2ML IJ SOLN: 4.0000 mg | INTRAMUSCULAR | Status: DC | PRN

## 2015-01-10 MED ORDER — LANOLIN HYDROUS EX OINT
TOPICAL_OINTMENT | CUTANEOUS | Status: DC | PRN
Start: 2015-01-10 — End: 2015-01-12

## 2015-01-10 MED ORDER — WITCH HAZEL-GLYCERIN EX PADS: 1.0000 "application " | MEDICATED_PAD | CUTANEOUS | Status: DC | PRN

## 2015-01-10 MED ORDER — PHENYLEPHRINE 40 MCG/ML (10ML) SYRINGE FOR IV PUSH (FOR BLOOD PRESSURE SUPPORT)
80.0000 ug | PREFILLED_SYRINGE | INTRAVENOUS | Status: DC | PRN
  Filled 2015-01-10: qty 20

## 2015-01-10 MED ORDER — SIMETHICONE 80 MG PO CHEW: 80.0000 mg | CHEWABLE_TABLET | ORAL | Status: DC | PRN

## 2015-01-10 NOTE — Lactation Note (Addendum)
This note was copied from the chart of Alejandra Cook. Lactation Consultation Note  4231w2d sleepy baby.  P2.  Mother breastfed 34 year old daughter for 10-11 months.  Reviewed late preterm feeding behavior. Mother used a #24NS with first child and brought NS to hospital.  Fitted mother for #20NS but suggest she try without the NS first. Mother was able to express approx 5 ml of colostrum into spoon. Undressed baby and gave him colostrum on spoon to stimulate his appetite. Attempted latching with and without #20NS.  Baby latched briefly without NS which encouraged mother that she may not need a NS.  Too early to tell. Reminded mother to not allow baby to suck on tip of nipple and compress breast to achieve more depth. Taught parents how to finger feed with curved tip syringe and baby slowly took the remaining colostrum. Baby tends to tongue thrust. Reviewed suck training. Mother has her own DEBP.  Reviewed cleaning and milk storage. Suggest she post pump for 10-15 min at least 4-6 times a day or 8 times if he is not latching. Mother exhausted so she will pump at least 2x tonight. Encouraged using hand expression before and after feeding and spoon feeding if needed. Give baby back volume pumped at next feeding with spoon or syringe. Recommend feeding baby every 3 hours or sooner with feeding cues. Left baby STS on mother's chest. Mom made aware of O/P services, breastfeeding support groups, community resources, and our phone # for post-discharge questions.    Patient Name: Alejandra Dyanne CarrelLauren Waldron UJWJX'BToday's Date: 01/10/2015 Reason for consult: Late preterm infant   Maternal Data    Feeding Feeding Type: Breast Fed Length of feed: 0 min  LATCH Score/Interventions                      Lactation Tools Discussed/Used Tools: 66F feeding tube / Syringe (spoon)   Consult Status Consult Status: Follow-up Date: 01/11/15 Follow-up type: In-patient    Dahlia ByesBerkelhammer, Ruth  [facility] 01/10/2015, 7:17 PM

## 2015-01-10 NOTE — Anesthesia Preprocedure Evaluation (Signed)
Anesthesia Evaluation  Patient identified by MRN, date of birth, ID band Patient awake    Reviewed: Allergy & Precautions, Patient's Chart, lab work & pertinent test results  Airway Mallampati: II  TM Distance: >3 FB Neck ROM: Full    Dental no notable dental hx. (+) Teeth Intact   Pulmonary shortness of breath and with exertion,  breath sounds clear to auscultation  Pulmonary exam normal       Cardiovascular negative cardio ROS Normal cardiovascular examRhythm:Regular Rate:Normal     Neuro/Psych  Headaches, PSYCHIATRIC DISORDERS Anxiety Depression  Neuromuscular disease    GI/Hepatic negative GI ROS, Neg liver ROS,   Endo/Other  negative endocrine ROS  Renal/GU negative Renal ROS     Musculoskeletal  (+) Arthritis -, Fibromyalgia -DDD lumbar spine   Abdominal   Peds  Hematology negative hematology ROS (+)   Anesthesia Other Findings   Reproductive/Obstetrics (+) Pregnancy                             Anesthesia Physical Anesthesia Plan  ASA: II  Anesthesia Plan: Epidural   Post-op Pain Management:    Induction:   Airway Management Planned: Natural Airway  Additional Equipment:   Intra-op Plan:   Post-operative Plan:   Informed Consent: I have reviewed the patients History and Physical, chart, labs and discussed the procedure including the risks, benefits and alternatives for the proposed anesthesia with the patient or authorized representative who has indicated his/her understanding and acceptance.     Plan Discussed with: Anesthesiologist  Anesthesia Plan Comments:         Anesthesia Quick Evaluation

## 2015-01-10 NOTE — Progress Notes (Signed)
Delivery Note At 12:38 PM a viable female was delivered via Vaginal, Spontaneous Delivery (PresentationLOA: ;  ).  APGAR: , ; weight  .   Placenta status: , .  Cord:3 vessels with nuchal cord x 1  with the following complications: .  Cord pH: pending  Anesthesia:   Episiotomy:  none Lacerations:  Second degree right periurethral and mid line lac both repaired Suture Repair: 2.0 vicryl rapide Est. Blood Loss (mL):    Mom to postpartum.  Baby to Couplet care / Skin to Skin.  Mandisa Persinger II,Cornie Herrington E 01/10/2015, 12:50 PM

## 2015-01-10 NOTE — Anesthesia Postprocedure Evaluation (Signed)
  Anesthesia Post-op Note  Patient: Alveria ApleyLauren H Leland  Procedure(s) Performed: * No procedures listed *  Patient Location: PACU and Mother/Baby  Anesthesia Type:Epidural  Level of Consciousness: awake, alert , oriented and patient cooperative  Airway and Oxygen Therapy: Patient Spontanous Breathing  Post-op Pain: none  Post-op Assessment: Post-op Vital signs reviewed, Patient's Cardiovascular Status Stable, Respiratory Function Stable, Patent Airway, No signs of Nausea or vomiting, Adequate PO intake, Pain level controlled, No headache, No backache, No residual numbness and No residual motor weakness  Post-op Vital Signs: Reviewed and stable  Last Vitals:  Filed Vitals:   01/10/15 1415  BP: 89/45  Pulse: 76  Temp:   Resp:     Complications: No apparent anesthesia complications

## 2015-01-10 NOTE — Anesthesia Procedure Notes (Signed)
Epidural Patient location during procedure: OB Start time: 01/10/2015 5:58 AM  Staffing Anesthesiologist: Mal AmabileFOSTER, Ahlivia Salahuddin  Preanesthetic Checklist Completed: patient identified, site marked, surgical consent, pre-op evaluation, timeout performed, IV checked, risks and benefits discussed and monitors and equipment checked  Epidural Patient position: sitting Prep: site prepped and draped and DuraPrep Patient monitoring: continuous pulse ox and blood pressure Approach: midline Location: L4-L5 Injection technique: LOR air  Needle:  Needle type: Tuohy  Needle gauge: 17 G Needle length: 9 cm and 9 Needle insertion depth: 4 cm Catheter type: closed end flexible Catheter size: 19 Gauge Catheter at skin depth: 9 cm Test dose: negative and Other  Assessment Events: blood not aspirated, injection not painful, no injection resistance, negative IV test and no paresthesia  Additional Notes Patient identified. Risks and benefits discussed including failed block, incomplete  Pain control, post dural puncture headache, nerve damage, paralysis, blood pressure Changes, nausea, vomiting, reactions to medications-both toxic and allergic and post Partum back pain. All questions were answered. Patient expressed understanding and wished to proceed. Sterile technique was used throughout procedure. Epidural site was Dressed with sterile barrier dressing. No paresthesias, signs of intravascular injection Or signs of intrathecal spread were encountered.  Patient was more comfortable after the epidural was dosed. Please see RN's note for documentation of vital signs and FHR which are stable.

## 2015-01-10 NOTE — H&P (Signed)
Alejandra Cook is a 34 y.o. female presenting for IOL secondary to cholestasis of pregnancy. Itching treated with ursodiol. S/P Cytotec x 1. Maternal Medical History:  Fetal activity: Perceived fetal activity is normal.      OB History    Gravida Para Term Preterm AB TAB SAB Ectopic Multiple Living   4 1 1  2  2   1      Past Medical History  Diagnosis Date  . XBMWUXLK(440.1Headache(784.0)     Dr. Neale [facility]lyFreeman at [facility]  . Degenerative disc disease, lumbar   . Bulging discs   . Fibromyalgia     after pregnancy, Followed by [facility]  . Allergy     Dr. Gwen PoundsKowalski, gold and nickel allergy  . Spontaneous abortion in second trimester Dec 2014  . Anxiety   . Depression     off of zoloft for 1 year, had problems with depression and anxiety after last child  . IBS (irritable bowel syndrome)   . Fibromyalgia   . Intrahepatic cholestasis of pregnancy in third trimester, antepartum    Past Surgical History  Procedure Laterality Date  . Nasal sinus surgery    . Pilonidal cyst excision    . Colonoscopy      normal  . Cystoscopy      ureteral stent inserted  . Vaginal delivery  2011  . Colposcopy  2002  . Laparoscopy N/A 03/20/2013    Procedure: LAPAROSCOPY OPERATIVE  IUD REMOVAL;  Surgeon: Zelphia CairoGretchen Adkins, MD;  Location: [facility];  Service: Gynecology;  Laterality: N/A;  Intraabdominal from Omentum  . Dilation and evacuation N/A 08/07/2013    Procedure: DILATATION AND EVACUATION;  Surgeon: Zelphia CairoGretchen Adkins, MD;  Location: [facility];  Service: Gynecology;  Laterality: N/A;   Family History: family history includes Cancer in her maternal grandmother and paternal grandmother; Hyperlipidemia in her mother; Hypertension in her father and paternal grandfather. Social History:  reports that she has never smoked. She has never used smokeless tobacco. She reports that she does not drink alcohol or use illicit drugs.   Prenatal Transfer Tool  Maternal Diabetes: No Genetic Screening: Normal Maternal  Ultrasounds/Referrals: Normal Fetal Ultrasounds or other Referrals:  None Maternal Substance Abuse:  No Significant Maternal Medications:  Meds include: Other: ursodio Significant Maternal Lab Results:  Lab values include: Other: elevated bile acids Other Comments:  None  Review of Systems  Eyes: Negative for blurred vision.  Gastrointestinal: Negative for abdominal pain.  Neurological: Negative for headaches.    Dilation: 2.5 Effacement (%): 70 Station: -2 Exam by:: Dr. Henderson Cloudomblin Blood pressure 109/68, pulse 74, resp. rate 18, height 5\' 3"  (1.6 m), weight 160 lb (72.576 kg), last menstrual period 05/01/2014. Maternal Exam:  Abdomen: Fetal presentation: vertex     Fetal Exam Fetal State Assessment: Category I - tracings are normal.     Physical Exam  Cardiovascular: Normal rate.   Respiratory: Effort normal.  Neurological: She has normal reflexes.   AROM clear  Prenatal labs: ABO, Rh: --/--/AB POS, AB POS (06/05 2015) Antibody: NEG (06/05 2015) Rubella: Immune (11/25 0000) RPR: Nonreactive (11/25 0000)  HBsAg: Negative (11/25 0000)  HIV: Non-reactive (11/25 0000)  GBS: Negative (06/03 0000)   Assessment/Plan: 34 yo G4P1 @ 36 2/7 weeks induced for cholestasis of pregnancy Entering active phase   Joziah Dollins II,Clerence Gubser E 01/10/2015, 7:35 AM

## 2015-01-11 LAB — CBC
HCT: 34.9 % — ABNORMAL LOW (ref 36.0–46.0)
Hemoglobin: 12 g/dL (ref 12.0–15.0)
MCH: 31.3 pg (ref 26.0–34.0)
MCHC: 34.4 g/dL (ref 30.0–36.0)
MCV: 91.1 fL (ref 78.0–100.0)
Platelets: 169 10*3/uL (ref 150–400)
RBC: 3.83 MIL/uL — AB (ref 3.87–5.11)
RDW: 13.8 % (ref 11.5–15.5)
WBC: 11.1 10*3/uL — ABNORMAL HIGH (ref 4.0–10.5)

## 2015-01-11 NOTE — Progress Notes (Signed)
Post Partum Day 1 Subjective: no complaints, up ad lib, voiding, tolerating PO and + flatus  Objective: Blood pressure 103/66, pulse 73, temperature 98.3 F (36.8 C), temperature source Oral, resp. rate 18, height 5\' 3"  (1.6 m), weight 160 lb (72.576 kg), last menstrual period 05/01/2014, SpO2 99 %, unknown if currently breastfeeding.  Physical Exam:  General: alert and cooperative Lochia: appropriate Uterine Fundus: firm Incision: healing well DVT Evaluation: No evidence of DVT seen on physical exam. Negative Homan's sign. No cords or calf tenderness. No significant calf/ankle edema.   Recent Labs  01/09/15 2015 01/11/15 0535  HGB 12.6 12.0  HCT 36.1 34.9*    Assessment/Plan: Plan for discharge tomorrow and Circumcision prior to discharge   LOS: 2 days   Ashaki Frosch G 01/11/2015, 8:12 AM

## 2015-01-11 NOTE — Lactation Note (Signed)
This note was copied from the chart of Alejandra Cook [place]. Lactation Consultation Note LPI, experienced BF states baby is BF much better and for longer periods of time. encouraged to massage breast during BF to express the colostrum to allow baby to get more during BF. Encouraged to wake baby for feedings d/t LBW. Discussed cluster feeding should start soon. encouraged hand expression and give colostrum as supplement. Mom states she has been doing that. Mom is occasionally using #20 NS. Discussed milk transfer and concerned baby isn't getting deep.  Patient Name: Alejandra Cook Harts ZOXWR'UToday's Date: 01/11/2015 Reason for consult: Follow-up assessment;Infant < 6lbs   Maternal Data Has patient been taught Hand Expression?: Yes Does the patient have breastfeeding experience prior to this delivery?: Yes  Feeding    LATCH Score/Interventions       Type of Nipple: Everted at rest and after stimulation (short shaft)  Comfort (Breast/Nipple): Soft / non-tender           Lactation Tools Discussed/Used     Consult Status Consult Status: Follow-up Date: 01/12/15 Follow-up type: In-patient    Charyl DancerCARVER, Lahna Nath G 01/11/2015, 12:10 PM

## 2015-01-12 MED ORDER — IBUPROFEN 600 MG PO TABS: 600.0000 mg | ORAL_TABLET | Freq: Four times a day (QID) | ORAL | Status: DC

## 2015-01-12 MED ORDER — OXYCODONE-ACETAMINOPHEN 5-325 MG PO TABS: 1.0000 | ORAL_TABLET | ORAL | Status: DC | PRN

## 2015-01-12 NOTE — Progress Notes (Signed)
CLINICAL SOCIAL WORK MATERNAL/CHILD NOTE  Patient Details  Name: Alejandra Cook MRN: [id number] Date of Birth: 01/10/2015  Date:  01/12/2015  Clinical Social Worker Initiating Note:  Loleta BooksSarah Yeila Morro, LCSW Date/ Time Initiated:  01/12/15/0900     Child's Name:  Alejandra Cook   Legal Guardian:  Leotis ShamesLauren and Vivi Barrackobert Shimkus (parents)   Need for Interpreter:  None   Date of Referral:  01/10/15     Reason for Referral:  History of postpartum depression and anxiety  [facility]   Address:  [address] [place], [place] [address]  Phone number:  [phone]   Household Members:  Spouse, Minor Children (Addilyn, May 2011)   Natural Supports (not living in the home):  Extended Family, Immediate Family, Friends   Herbalistrofessional Supports: None   Employment: Full-time   Type of Work: WriterTeacher   Education:    N/A  Architectinancial Resources:  Media plannerrivate Insurance   Other Resources:    None identified  Cultural/Religious Considerations Which May Impact Care:  None reported  Strengths:  Ability to meet basic needs , Home prepared for child , Pediatrician chosen    Risk Factors/Current Problems:   1) History of postpartum depression/anxiety. MOB endorsed onset of postpartum depression symptoms 5 months postpartum after daughter was born in 2011. She stated that she was prescribed Zoloft and found the medication to to be helpful.  MOB shared that she discontinued medication when she and FOB began efforts to conceive, and stated that she was able to cope with two miscarriages without re-starting medication.  MOB denied mental health concerns during the pregnancy.   Cognitive State:  Able to Concentrate , Alert , Linear Thinking , Insightful , Goal Oriented    Mood/Affect:  Animated, Happy , Interested , Comfortable , Calm , Bright    CSW Assessment:  CSW received consult for history of postpartum depression/anxiety.  MOB and FOB presented as easily engaged and receptive to the  visit. MOB and FOB openly discussed MOB's mental health history, and presented with insight, awareness, and motivation to address maternal mental health concerns as they arise.  MOB displayed a full range in affect and was in a pleasant mood.  CSW observed no acute mental health concerns.  MOB was holding the infant during the visit, and was noted to be smiling when she interacted with him.   Per MOB, she experienced symptoms of postpartum depression 5 months postpartum.  She shared that she did not address her feelings with her provider since she did not realize that what she was feeling was postpartum depression. MOB shared that she feels more prepared as she transitions to this postpartum period since she is aware of postpartum depression, has had success on Zoloft, and believes that she will notify her medical provider sooner if needed. She stated that she and the FOB have initiated conversations with her OB about her risk for developing symptoms and discussed that everyone is aware of her risk.   MOB denied any depression/anxiety during the pregnancy despite physical discomforts during the pregnancy.  She shared belief that she has a strong support system who will also help her recovery from the labor and delivery and will help her and the FOB to receive enough sleep.   CSW reviewed education on perinatal mood and anxiety disorders, including treatment options and strategies to support maternal mental health.  MOB and FOB inquired about rates of postpartum depression with second children versus first children, and CSW reviewed risk and protective  factors.  MOB and FOB discussed how they feel more prepared not only because of having an increased awareness about perinatal mood disorders, but also because thye have more realistic expectations of parenthood and are more receptive to accepting help.    MOB and FOB denied additional questions, concerns, or needs at this time.  MOB and FOB expressed appreciation  for the visit and agreed to contact CSW if needs arise.    CSW Plan/Description:   1)Patient/Family Education: Perinatal mood and anxiety disorders. MOB to contact OB if she notes onset of symptoms. 2)No Further Intervention Required/No Barriers to Discharge    Pervis Hocking, LCSW 01/12/2015, 10:30 AM

## 2015-01-12 NOTE — Discharge Summary (Signed)
Obstetric Discharge Summary Reason for Admission: induction of labor Prenatal Procedures: ultrasound Intrapartum Procedures: spontaneous vaginal delivery Postpartum Procedures: none Complications-Operative and Postpartum: vaginal and periurethral laceration HEMOGLOBIN  Date Value Ref Range Status  01/11/2015 12.0 12.0 - 15.0 g/dL Final   HGB  Date Value Ref Range Status  08/03/2010 14.9 11.6 - 15.9 g/dL Final   HCT  Date Value Ref Range Status  01/11/2015 34.9* 36.0 - 46.0 % Final  08/03/2010 42.8 34.8 - 46.6 % Final    Physical Exam:  General: alert and cooperative Lochia: appropriate Uterine Fundus: firm Incision: healing well DVT Evaluation: No evidence of DVT seen on physical exam. Negative Homan's sign. No cords or calf tenderness. No significant calf/ankle edema.  Discharge Diagnoses: Term Pregnancy-delivered  Discharge Information: Date: 01/12/2015 Activity: pelvic rest Diet: routine Medications: PNV, Ibuprofen and Percocet Condition: stable Instructions: refer to practice specific booklet Discharge to: home   Newborn Data: Live born female  Birth Weight: 5 lb 15.2 oz (2699 g) APGAR: 9, 9  Home with mother.  Ladaisha Portillo G 01/12/2015, 8:22 AM

## 2015-01-19 ENCOUNTER — Ambulatory Visit (HOSPITAL_COMMUNITY): Payer: BC Managed Care – PPO

## 2015-02-13 ENCOUNTER — Inpatient Hospital Stay (HOSPITAL_COMMUNITY)
Admission: AD | Admit: 2015-02-13 | Discharge: 2015-02-13 | Disposition: A | Discharge status: Home / Self Care | Payer: BC Managed Care – PPO | Source: Ambulatory Visit | Attending: Obstetrics and Gynecology | Admitting: Obstetrics and Gynecology

## 2015-02-13 ENCOUNTER — Encounter (HOSPITAL_COMMUNITY): Payer: Self-pay | Admitting: *Deleted

## 2015-02-13 DIAGNOSIS — R509 Fever, unspecified: Secondary | ICD-10-CM | POA: Diagnosis present

## 2015-02-13 DIAGNOSIS — B349 Viral infection, unspecified: Secondary | ICD-10-CM | POA: Insufficient documentation

## 2015-02-13 DIAGNOSIS — Z886 Allergy status to analgesic agent status: Secondary | ICD-10-CM | POA: Insufficient documentation

## 2015-02-13 LAB — CBC
HCT: 42.9 % (ref 36.0–46.0)
Hemoglobin: 14.8 g/dL (ref 12.0–15.0)
MCH: 31.4 pg (ref 26.0–34.0)
MCHC: 34.5 g/dL (ref 30.0–36.0)
MCV: 90.9 fL (ref 78.0–100.0)
Platelets: 122 10*3/uL — ABNORMAL LOW (ref 150–400)
RBC: 4.72 MIL/uL (ref 3.87–5.11)
RDW: 13 % (ref 11.5–15.5)
WBC: 11.7 10*3/uL — ABNORMAL HIGH (ref 4.0–10.5)

## 2015-02-13 LAB — URINALYSIS, ROUTINE W REFLEX MICROSCOPIC
BILIRUBIN URINE: NEGATIVE
Glucose, UA: NEGATIVE mg/dL
Ketones, ur: 15 mg/dL — AB
Nitrite: NEGATIVE
PH: 6 (ref 5.0–8.0)
Protein, ur: NEGATIVE mg/dL
SPECIFIC GRAVITY, URINE: 1.02 (ref 1.005–1.030)
Urobilinogen, UA: 0.2 mg/dL (ref 0.0–1.0)

## 2015-02-13 LAB — URINE MICROSCOPIC-ADD ON

## 2015-02-13 MED ORDER — ONDANSETRON HCL 8 MG PO TABS: 8.0000 mg | ORAL_TABLET | Freq: Three times a day (TID) | ORAL | Status: DC | PRN

## 2015-02-13 NOTE — MAU Note (Signed)
Pt presents to MAU with complaints of generalized body aches, fever, headache that started yesterday. Delivered vaginally on JUne the 6th.

## 2015-02-13 NOTE — MAU Provider Note (Signed)
History     CSN: [id number]  Arrival date and time: 02/13/15 1503   First Provider Initiated Contact with Patient 02/13/15 1553      Chief Complaint  Patient presents with  . Fever  . Generalized Body Aches  . Headache   HPI  Alejandra Cook  34 y.o. [id number]   Pt presents to MAU with complaints of generalized body aches, fever, headache that started yesterday. Delivered vaginally on JUne the 6th.            Past Medical History  Diagnosis Date  . UUVOZDGU(440.3Headache(784.0)     Dr. Neale [facility]lyFreeman at [facility]  . Degenerative disc disease, lumbar   . Bulging discs   . Fibromyalgia     after pregnancy, Followed by [facility]  . Allergy     Dr. Gwen PoundsKowalski, gold and nickel allergy  . Spontaneous abortion in second trimester Dec 2014  . Anxiety   . Depression     off of zoloft for 1 year, had problems with depression and anxiety after last child  . IBS (irritable bowel syndrome)   . Fibromyalgia   . 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 Nasal sinus surgery    . Pilonidal cyst excision    . Colonoscopy      normal  . Cystoscopy      ureteral stent inserted  . Vaginal delivery  2011  . Colposcopy  2002  . Laparoscopy N/A 03/20/2013    Procedure: LAPAROSCOPY OPERATIVE  IUD REMOVAL;  Surgeon: Zelphia CairoGretchen Adkins, MD;  Location: [facility];  Service: Gynecology;  Laterality: N/A;  Intraabdominal from Omentum  . Dilation and evacuation N/A 08/07/2013    Procedure: DILATATION AND EVACUATION;  Surgeon: Zelphia CairoGretchen Adkins, MD;  Location: [facility];  Service: Gynecology;  Laterality: N/A;    Family History  Problem Relation Age of Onset  . Hyperlipidemia Mother   . Hypertension Father   . Cancer Maternal Grandmother     cancer, breast  . Cancer Paternal Grandmother     breast  . Hypertension Paternal Grandfather     History  Substance Use Topics  . Smoking status: Never Smoker   . Smokeless  tobacco: Never Used  . Alcohol Use: No    Allergies: No Known Allergies  Prescriptions prior to admission  Medication Sig Dispense Refill Last Dose  . acetaminophen (TYLENOL) 325 MG tablet Take 650 mg by mouth every 6 (six) hours as needed for fever.    02/13/2015 at 1330  . cyclobenzaprine (FLEXERIL) 10 MG tablet Take 5 mg by mouth 3 (three) times daily as needed for muscle spasms.   02/12/2015 at Unknown time  . ibuprofen (ADVIL,MOTRIN) 600 MG tablet Take 1 tablet (600 mg total) by mouth every 6 (six) hours. 30 tablet 1 02/12/2015 at 1900  . oxyCODONE-acetaminophen (PERCOCET/ROXICET) 5-325 MG per tablet Take 1 tablet by mouth every 4 (four) hours as needed (for pain scale 4-7). 30 tablet 0 Past Month at Unknown time  . Prenatal Vit-Fe Fumarate-FA (PRENATAL MULTIVITAMIN) TABS tablet Take 1 tablet by mouth daily.    Past Week at Unknown time    Review of Systems  Constitutional: Positive for fever.  Gastrointestinal: Positive for nausea, vomiting and abdominal pain.  Neurological: Positive for headaches.  All other systems reviewed and are negative.  Physical Exam   Blood pressure 125/79, pulse 132, temperature 100.2 F (37.9 C), resp. rate 20, last menstrual  period 01/10/2015, unknown if currently breastfeeding.  Physical Exam  Nursing note and vitals reviewed. Constitutional: She is oriented to person, place, and time. She appears well-developed and well-nourished. No distress.  HENT:  Head: Normocephalic.  Neck: Normal range of motion.  Cardiovascular: Normal rate.   Respiratory: Effort normal. No respiratory distress.  GI: Soft. There is no tenderness.  Genitourinary: Uterus normal.  Musculoskeletal: Normal range of motion.  Neurological: She is alert and oriented to person, place, and time.  Skin: Skin is warm and dry. No rash noted. No erythema. No pallor.  Psychiatric: She has a normal mood and affect. Her behavior is normal. Judgment and thought content normal.    MAU  Course  Procedures Results for orders placed or performed during the hospital encounter of 02/13/15 (from the past 24 hour(s))  CBC     Status: Abnormal   Collection Time: 02/13/15  2:03 PM  Result Value Ref Range   WBC 11.7 (H) 4.0 - 10.5 K/uL   RBC 4.72 3.87 - 5.11 MIL/uL   Hemoglobin 14.8 12.0 - 15.0 g/dL   HCT 78.2 95.6 - 21.3 %   MCV 90.9 78.0 - 100.0 fL   MCH 31.4 26.0 - 34.0 pg   MCHC 34.5 30.0 - 36.0 g/dL   RDW 08.6 57.8 - 46.9 %   Platelets 122 (L) 150 - 400 K/uL  Urinalysis, Routine w reflex microscopic (not at [facility])     Status: Abnormal   Collection Time: 02/13/15  3:38 PM  Result Value Ref Range   Color, Urine YELLOW YELLOW   APPearance CLEAR CLEAR   Specific Gravity, Urine 1.020 1.005 - 1.030   pH 6.0 5.0 - 8.0   Glucose, UA NEGATIVE NEGATIVE mg/dL   Hgb urine dipstick LARGE (A) NEGATIVE   Bilirubin Urine NEGATIVE NEGATIVE   Ketones, ur 15 (A) NEGATIVE mg/dL   Protein, ur NEGATIVE NEGATIVE mg/dL   Urobilinogen, UA 0.2 0.0 - 1.0 mg/dL   Nitrite NEGATIVE NEGATIVE   Leukocytes, UA MODERATE (A) NEGATIVE  Urine microscopic-add on     Status: Abnormal   Collection Time: 02/13/15  3:38 PM  Result Value Ref Range   Squamous Epithelial / LPF FEW (A) RARE   WBC, UA 7-10 <3 WBC/hpf   RBC / HPF 0-2 <3 RBC/hpf   Bacteria, UA MANY (A) RARE    MDM   Assessment and Plan  Viral Illness  Zofran 8 mg po RX Discharge to home  [name] 02/13/2015, 4:12 PM

## 2015-02-13 NOTE — Discharge Instructions (Signed)

## 2015-02-22 ENCOUNTER — Other Ambulatory Visit: Payer: Self-pay | Admitting: Obstetrics and Gynecology

## 2015-02-23 LAB — CYTOLOGY - PAP

## 2015-03-10 ENCOUNTER — Encounter: Payer: Self-pay | Admitting: Internal Medicine

## 2015-04-18 ENCOUNTER — Ambulatory Visit (INDEPENDENT_AMBULATORY_CARE_PROVIDER_SITE_OTHER): Payer: BC Managed Care – PPO | Admitting: Family Medicine

## 2015-04-18 ENCOUNTER — Encounter: Payer: Self-pay | Admitting: Family Medicine

## 2015-04-18 VITALS — BP 110/72 | HR 85 | Temp 98.2°F | Ht 63.0 in | Wt 148.2 lb

## 2015-04-18 DIAGNOSIS — J069 Acute upper respiratory infection, unspecified: Secondary | ICD-10-CM | POA: Diagnosis not present

## 2015-04-18 NOTE — Progress Notes (Signed)
Pre visit review using our clinic review tool, if applicable. No additional management support is needed unless otherwise documented below in the visit note. 

## 2015-04-18 NOTE — Patient Instructions (Signed)
Nice to meet you. You likely have a viral cold.  Please stay well hydrated. You can use nasal saline to help with this issue.  If you develop fevers, worsening symptoms, purulent nasal discharge, shortness of breath, or get better then get worse please seek medical attention.

## 2015-04-18 NOTE — Assessment & Plan Note (Addendum)
Symptoms consistent with viral URI. Length of symptoms and severity not consistent with bacterial infection. Potentially could be related to her allergic rhinitis. Well appearing and vitals stable. Will continue zyrtec and tylenol. Advised on possibility of excretion in to breast milk. Can use nasal saline as well. Given return precautions.

## 2015-04-18 NOTE — Progress Notes (Signed)
Patient ID: Alejandra Cook, female   DOB: 24-Dec-1980, 34 y.o.   MRN: [id number]  Alejandra Alar, MD Phone: [phone]  Alejandra Cook is a 34 y.o. female who presents today for same day appointment.   URI: patient reports onset of sore scratchy throat on Friday. Has post nasal drip with this. Has develops sinus pressure in maxillary and frontal sinuses. Notes mild non-productive cough. No fevers or dyspnea. No mucus out of nose. Has been taking zyrtec and tylenol prn for this per her OBs information sheet. She is breast feeding a 44 month old female. Has history of sinus infection and bronchitis last year.   PMH: nonsmoker.    ROS see HPI  Objective  Physical Exam Filed Vitals:   04/18/15 1006  BP: 110/72  Pulse: 85  Temp: 98.2 F (36.8 C)   Physical Exam  Constitutional: She is well-developed, well-nourished, and in no distress.  Well appearing  HENT:  Head: Normocephalic and atraumatic.  Right Ear: External ear normal.  Left Ear: External ear normal.  Mouth/Throat: No oropharyngeal exudate.  Posterior OP with mild erythema, no exudates  Eyes: Conjunctivae are normal. Pupils are equal, round, and reactive to light.  Neck: Neck supple.  Cardiovascular: Normal rate, regular rhythm and normal heart sounds.  Exam reveals no gallop and no friction rub.   No murmur heard. Pulmonary/Chest: Effort normal and breath sounds normal. No respiratory distress. She has no wheezes. She has no rales.  Lymphadenopathy:    She has no cervical adenopathy.  Skin: Skin is warm and dry. She is not diaphoretic.     Assessment/Plan: Please see individual problem list.  URI (upper respiratory infection) Symptoms consistent with viral URI. Length of symptoms and severity not consistent with bacterial infection. Potentially could be related to her allergic rhinitis. Will continue zyrtec and tylenol. Advised on possibility of excretion in to breast milk. Can use nasal saline as well. Given return  precautions.    Alejandra Cook

## 2015-08-15 ENCOUNTER — Ambulatory Visit (INDEPENDENT_AMBULATORY_CARE_PROVIDER_SITE_OTHER): Payer: BC Managed Care – PPO | Admitting: Internal Medicine

## 2015-08-15 ENCOUNTER — Telehealth: Payer: Self-pay | Admitting: Internal Medicine

## 2015-08-15 ENCOUNTER — Encounter: Payer: Self-pay | Admitting: Internal Medicine

## 2015-08-15 VITALS — BP 115/71 | HR 73 | Temp 98.7°F | Ht 63.0 in | Wt 145.4 lb

## 2015-08-15 DIAGNOSIS — J020 Streptococcal pharyngitis: Secondary | ICD-10-CM | POA: Diagnosis not present

## 2015-08-15 MED ORDER — AMOXICILLIN-POT CLAVULANATE 875-125 MG PO TABS: 1.0000 | ORAL_TABLET | Freq: Two times a day (BID) | ORAL | Status: DC

## 2015-08-15 NOTE — Telephone Encounter (Signed)
Follow up.

## 2015-08-15 NOTE — Patient Instructions (Signed)
Start Augmentin twice daily.  Use ibuprofen or tylenol as needed for pain.  Follow up if symptoms are not improving.

## 2015-08-15 NOTE — Assessment & Plan Note (Signed)
Symptoms and exam c/w strep. Rapid strep pos. Start Augmentin bid. Tylenol or Ibuprofen for pain or fever. Probiotic yogurt while on Augmentin. Follow up if symptoms are not improving.

## 2015-08-15 NOTE — Progress Notes (Signed)
Subjective:    Patient ID: Alejandra Cook, female    DOB: [birth year], 34 y.o.   MRN: [id number]  HPI  34YO female presents for acute visit.  Congestion, sore throat , and sinus pressure - started last Wednesday. No fever at home. No NV. No headache. Occasional dry cough. Some tender lymph nodes over neck. No trouble swallowing. Taking Mucinex with some improvement.  Daughter tested positive for strep throat.   Wt Readings from Last 3 Encounters:  08/15/15 145 lb 6 oz (65.942 kg)  04/18/15 148 lb 3.2 oz (67.223 kg)  01/09/15 160 lb (72.576 kg)   BP Readings from Last 3 Encounters:  08/15/15 115/71  04/18/15 110/72  02/13/15 107/66    Past Medical History  Diagnosis Date  . WGNFAOZH(086.5)     Dr. Neale Burly at [facility]  . Degenerative disc disease, lumbar   . Bulging discs   . Fibromyalgia     after pregnancy, Followed by [facility]  . Allergy     Dr. Gwen Pounds, gold and nickel allergy  . Spontaneous abortion in second trimester Dec 2014  . Anxiety   . Depression     off of zoloft for 1 year, had problems with depression and anxiety after last child  . IBS (irritable bowel syndrome)   . Fibromyalgia   . Intrahepatic cholestasis of pregnancy in third trimester, antepartum    Family History  Problem Relation Age of Onset  . Hyperlipidemia Mother   . Hypertension Father   . Cancer Maternal Grandmother     cancer, breast  . Cancer Paternal Grandmother     breast  . Hypertension Paternal Grandfather    Past Surgical History  Procedure Laterality Date  . Nasal sinus surgery    . Pilonidal cyst excision    . Colonoscopy      normal  . Cystoscopy      ureteral stent inserted  . Vaginal delivery  2011  . Colposcopy  2002  . Laparoscopy N/A 03/20/2013    Procedure: LAPAROSCOPY OPERATIVE  IUD REMOVAL;  Surgeon: Zelphia Cairo, MD;  Location: [facility];  Service: Gynecology;  Laterality: N/A;  Intraabdominal from Omentum  . Dilation and evacuation N/A 08/07/2013     Procedure: DILATATION AND EVACUATION;  Surgeon: Zelphia Cairo, MD;  Location: [facility];  Service: Gynecology;  Laterality: N/A;   Social History   Social History  . Marital Status: Married    Spouse Name: N/A  . Number of Children: N/A  . Years of Education: N/A   Social History Main Topics  . Smoking status: Never Smoker   . Smokeless tobacco: Never Used  . Alcohol Use: No  . Drug Use: No  . Sexual Activity: Yes   Other Topics Concern  . None   Social History Narrative   Lives in [place]. 2YO daughter.      Works- Cabin crew ED, [employer]    Review of Systems  Constitutional: Negative for fever, chills and unexpected weight change.  HENT: Positive for congestion and sore throat. Negative for ear discharge, ear pain, facial swelling, hearing loss, mouth sores, nosebleeds, postnasal drip, rhinorrhea, sinus pressure, sneezing, tinnitus, trouble swallowing and voice change.   Eyes: Negative for pain, discharge, redness and visual disturbance.  Respiratory: Positive for cough. Negative for chest tightness, shortness of breath, wheezing and stridor.   Cardiovascular: Negative for chest pain, palpitations and leg swelling.  Gastrointestinal: Negative for nausea.  Musculoskeletal: Negative for myalgias, arthralgias, neck pain and neck stiffness.  Skin: Negative for color change and rash.  Neurological: Negative for dizziness, weakness, light-headedness and headaches.  Hematological: Positive for adenopathy.       Objective:    BP 115/71 mmHg  Pulse 73  Temp(Src) 98.7 F (37.1 C)  Ht 5\' 3"  (1.6 m)  Wt 145 lb 6 oz (65.942 kg)  BMI 25.76 kg/m2  SpO2 99% Physical Exam  Constitutional: She is oriented to person, place, and time. She appears well-developed and well-nourished. No distress.  HENT:  Head: Normocephalic and atraumatic.  Right Ear: Tympanic membrane, external ear and ear canal normal.  Left Ear: Tympanic membrane, external ear and ear canal normal.    Nose: Nose normal.  Mouth/Throat: Posterior oropharyngeal erythema present. No oropharyngeal exudate.  Eyes: Conjunctivae are normal. Pupils are equal, round, and reactive to light. Right eye exhibits no discharge. Left eye exhibits no discharge. No scleral icterus.  Neck: Normal range of motion. Neck supple. No tracheal deviation present. No thyromegaly present.  Cardiovascular: Normal rate, regular rhythm, normal heart sounds and intact distal pulses.  Exam reveals no gallop and no friction rub.   No murmur heard. Pulmonary/Chest: Effort normal and breath sounds normal. No respiratory distress. She has no wheezes. She has no rales. She exhibits no tenderness.  Musculoskeletal: Normal range of motion. She exhibits no edema or tenderness.  Lymphadenopathy:    She has cervical adenopathy (tender bilateral).       Right cervical: Superficial cervical adenopathy present.       Left cervical: Superficial cervical adenopathy present.  Neurological: She is alert and oriented to person, place, and time. No cranial nerve deficit. She exhibits normal muscle tone. Coordination normal.  Skin: Skin is warm and dry. No rash noted. She is not diaphoretic. No erythema. No pallor.  Psychiatric: She has a normal mood and affect. Her behavior is normal. Judgment and thought content normal.          Assessment & Plan:   Problem List Items Addressed This Visit      Unprioritized   Streptococcal sore throat - Primary    Symptoms and exam c/w strep. Rapid strep pos. Start Augmentin bid. Tylenol or Ibuprofen for pain or fever. Probiotic yogurt while on Augmentin. Follow up if symptoms are not improving.      Relevant Medications   amoxicillin-clavulanate (AUGMENTIN) 875-125 MG tablet   Other Relevant Orders   POCT rapid strep A       Return if symptoms worsen or fail to improve.

## 2015-08-15 NOTE — Progress Notes (Signed)
Pre visit review using our clinic review tool, if applicable. No additional management support is needed unless otherwise documented below in the visit note. 

## 2015-10-31 ENCOUNTER — Emergency Department: Payer: BC Managed Care – PPO

## 2015-10-31 ENCOUNTER — Encounter: Payer: Self-pay | Admitting: Emergency Medicine

## 2015-10-31 ENCOUNTER — Emergency Department
Admission: EM | Admit: 2015-10-31 | Discharge: 2015-10-31 | Disposition: A | Discharge status: Home / Self Care | Payer: BC Managed Care – PPO | Attending: Emergency Medicine | Admitting: Emergency Medicine

## 2015-10-31 DIAGNOSIS — Z791 Long term (current) use of non-steroidal anti-inflammatories (NSAID): Secondary | ICD-10-CM | POA: Diagnosis not present

## 2015-10-31 DIAGNOSIS — N83209 Unspecified ovarian cyst, unspecified side: Secondary | ICD-10-CM | POA: Diagnosis not present

## 2015-10-31 DIAGNOSIS — M5136 Other intervertebral disc degeneration, lumbar region: Secondary | ICD-10-CM | POA: Insufficient documentation

## 2015-10-31 DIAGNOSIS — Z79899 Other long term (current) drug therapy: Secondary | ICD-10-CM | POA: Insufficient documentation

## 2015-10-31 DIAGNOSIS — F329 Major depressive disorder, single episode, unspecified: Secondary | ICD-10-CM | POA: Insufficient documentation

## 2015-10-31 DIAGNOSIS — R1031 Right lower quadrant pain: Secondary | ICD-10-CM | POA: Diagnosis present

## 2015-10-31 LAB — COMPREHENSIVE METABOLIC PANEL
ALT: 12 U/L — ABNORMAL LOW (ref 14–54)
ANION GAP: 7 (ref 5–15)
AST: 22 U/L (ref 15–41)
Albumin: 4.7 g/dL (ref 3.5–5.0)
Alkaline Phosphatase: 83 U/L (ref 38–126)
BILIRUBIN TOTAL: 0.3 mg/dL (ref 0.3–1.2)
BUN: 15 mg/dL (ref 6–20)
CHLORIDE: 109 mmol/L (ref 101–111)
CO2: 19 mmol/L — AB (ref 22–32)
Calcium: 9.1 mg/dL (ref 8.9–10.3)
Creatinine, Ser: 0.74 mg/dL (ref 0.44–1.00)
GFR calc Af Amer: >60 mL/min (ref >60)
GFR calc non Af Amer: >60 mL/min (ref >60)
GLUCOSE: 85 mg/dL (ref 65–99)
Potassium: 3.2 mmol/L — ABNORMAL LOW (ref 3.5–5.1)
SODIUM: 135 mmol/L (ref 135–145)
TOTAL PROTEIN: 7.8 g/dL (ref 6.5–8.1)

## 2015-10-31 LAB — URINALYSIS COMPLETE WITH MICROSCOPIC (ARMC ONLY)
Bilirubin Urine: NEGATIVE
GLUCOSE, UA: NEGATIVE mg/dL
Hgb urine dipstick: NEGATIVE
KETONES UR: NEGATIVE mg/dL
NITRITE: NEGATIVE
Protein, ur: NEGATIVE mg/dL
RBC / HPF: NONE SEEN RBC/hpf (ref 0–5)
SPECIFIC GRAVITY, URINE: 1.008 (ref 1.005–1.030)
pH: 8 (ref 5.0–8.0)

## 2015-10-31 LAB — CBC
HEMATOCRIT: 42.7 % (ref 35.0–47.0)
HEMOGLOBIN: 14.6 g/dL (ref 12.0–16.0)
MCH: 29.9 pg (ref 26.0–34.0)
MCHC: 34.2 g/dL (ref 32.0–36.0)
MCV: 87.2 fL (ref 80.0–100.0)
Platelets: 218 10*3/uL (ref 150–440)
RBC: 4.9 MIL/uL (ref 3.80–5.20)
RDW: 13.6 % (ref 11.5–14.5)
WBC: 8 10*3/uL (ref 3.6–11.0)

## 2015-10-31 LAB — POCT PREGNANCY, URINE: Preg Test, Ur: NEGATIVE

## 2015-10-31 LAB — LIPASE, BLOOD: LIPASE: 35 U/L (ref 11–51)

## 2015-10-31 MED ORDER — IOPAMIDOL (ISOVUE-300) INJECTION 61%
100.0000 mL | Freq: Once | INTRAVENOUS | Status: AC | PRN
  Administered 2015-10-31: 100 mL via INTRAVENOUS
  Filled 2015-10-31: qty 100

## 2015-10-31 MED ORDER — DIATRIZOATE MEGLUMINE & SODIUM 66-10 % PO SOLN
15.0000 mL | ORAL | Status: AC
Start: 2015-10-31 — End: 2015-10-31
  Administered 2015-10-31: 15 mL via ORAL
  Filled 2015-10-31 (×2): qty 30

## 2015-10-31 NOTE — ED Notes (Signed)
Pt here with c/o sharp RLQ pain that began today. Pt does have her appendix. Does have a hx of kidney stones, denies flank pain, denies fever, denies burning with urination.

## 2015-10-31 NOTE — ED Provider Notes (Signed)
[facility] Emergency Department Provider Note  ____________________________________________    I have reviewed the triage vital signs and the nursing notes.   HISTORY  Chief Complaint Abdominal Pain    HPI Alejandra Cook is a 35 y.o. female who presents with right lower quadrant abdominal pain that is moderate to severe and constant. The pain started abruptly today prior to that she had felt well. She denies a history of abdominal surgery. She does have a history of kidney stones but this feels different. No fevers or chills. No recent travel. No diarrhea. Mild nausea.     Past Medical History  Diagnosis Date  . ZOXWRUEA(540.9)     Dr. Neale Burly at [facility]  . Degenerative disc disease, lumbar   . Bulging discs   . Fibromyalgia     after pregnancy, Followed by [facility]  . Allergy     Dr. Gwen Pounds, gold and nickel allergy  . Spontaneous abortion in second trimester Dec 2014  . Anxiety   . Depression     off of zoloft for 1 year, had problems with depression and anxiety after last child  . IBS (irritable bowel syndrome)   . Fibromyalgia   . Intrahepatic cholestasis of pregnancy in third trimester, antepartum     Patient Active Problem List   Diagnosis Date Noted  . Streptococcal sore throat 08/15/2015  . Cholestasis of pregnancy 01/09/2015  . Other malaise and fatigue 09/27/2013  . Rash and nonspecific skin eruption 07/01/2013  . Routine general medical examination at a health care facility 01/21/2013  . Fibromyalgia 01/17/2012  . Allergic rhinitis 01/17/2012  . MIGRAINE 05/12/2007  . DEGENERATIVE DISC DISEASE, LUMBAR SPINE 05/12/2007    Past Surgical History  Procedure Laterality Date  . Nasal sinus surgery    . Pilonidal cyst excision    . Colonoscopy      normal  . Cystoscopy      ureteral stent inserted  . Vaginal delivery  2011  . Colposcopy  2002  . Laparoscopy N/A 03/20/2013    Procedure: LAPAROSCOPY OPERATIVE   IUD REMOVAL;  Surgeon: Zelphia Cairo, MD;  Location: [facility];  Service: Gynecology;  Laterality: N/A;  Intraabdominal from Omentum  . Dilation and evacuation N/A 08/07/2013    Procedure: DILATATION AND EVACUATION;  Surgeon: Zelphia Cairo, MD;  Location: [facility];  Service: Gynecology;  Laterality: N/A;    Current Outpatient Rx  Name  Route  Sig  Dispense  Refill  . acetaminophen (TYLENOL) 325 MG tablet   Oral   Take 650 mg by mouth every 6 (six) hours as needed for fever.          Marland Kitchen amoxicillin-clavulanate (AUGMENTIN) 875-125 MG tablet   Oral   Take 1 tablet by mouth 2 (two) times daily.   20 tablet   0   . ibuprofen (ADVIL,MOTRIN) 600 MG tablet   Oral   Take 1 tablet (600 mg total) by mouth every 6 (six) hours.   30 tablet   1   . ondansetron (ZOFRAN) 8 MG tablet   Oral   Take 1 tablet (8 mg total) by mouth every 8 (eight) hours as needed for nausea or vomiting.   30 tablet   0   . Prenatal Vit-Fe Fumarate-FA (PRENATAL MULTIVITAMIN) TABS tablet   Oral   Take 1 tablet by mouth daily.          . sertraline (ZOLOFT) 50 MG tablet   Oral   Take 50 mg by  mouth daily.      12     Allergies Review of patient's allergies indicates no known allergies.  Family History  Problem Relation Age of Onset  . Hyperlipidemia Mother   . Hypertension Father   . Cancer Maternal Grandmother     cancer, breast  . Cancer Paternal Grandmother     breast  . Hypertension Paternal Grandfather     Social History Social History  Substance Use Topics  . Smoking status: Never Smoker   . Smokeless tobacco: Never Used  . Alcohol Use: No    Review of Systems  Constitutional: Negative for fever. Eyes: Negative for redness ENT: Negative for sore throat Cardiovascular: Negative for chest pain Respiratory: Negative for shortness of breath. Gastrointestinal: As above Genitourinary: Negative for dysuria. Musculoskeletal: Negative for back pain. Skin: Negative for  rash. Neurological: Negative for headache Psychiatric: no anxiety    ____________________________________________   PHYSICAL EXAM:  VITAL SIGNS: ED Triage Vitals  Enc Vitals Group     BP 10/31/15 1749 117/74 mmHg     Pulse Rate 10/31/15 1749 91     Resp 10/31/15 1749 16     Temp 10/31/15 1749 98.6 F (37 C)     Temp Source 10/31/15 1749 Oral     SpO2 10/31/15 1749 100 %     Weight 10/31/15 1749 135 lb (61.236 kg)     Height 10/31/15 1749 5\' 3"  (1.6 m)     Head Cir --      Peak Flow --      Pain Score 10/31/15 1749 6     Pain Loc --      Pain Edu? --      Excl. in GC? --      Constitutional: Alert and oriented. Well appearing and in no distress.  Eyes: Conjunctivae are normal. No erythema or injection ENT   Head: Normocephalic and atraumatic.   Mouth/Throat: Mucous membranes are moist. Cardiovascular: Normal rate, regular rhythm.  Respiratory: Normal respiratory effort without tachypnea nor retractions.  Gastrointestinal: Tenderness to palpation in the right lower quadrant that is moderate. No distention. There is no CVA tenderness. Genitourinary: deferred Musculoskeletal: Nontender with normal range of motion in all extremities. No lower extremity tenderness nor edema. Neurologic:  Normal speech and language. No gross focal neurologic deficits are appreciated. Skin:  Skin is warm, dry and intact. No rash noted. Psychiatric: Mood and affect are normal. Patient exhibits appropriate insight and judgment.  ____________________________________________    LABS (pertinent positives/negatives)  Labs Reviewed  COMPREHENSIVE METABOLIC PANEL - Abnormal; Notable for the following:    Potassium 3.2 (*)    CO2 19 (*)    ALT 12 (*)    All other components within normal limits  URINALYSIS COMPLETEWITH MICROSCOPIC ([facility] ONLY) - Abnormal; Notable for the following:    Color, Urine STRAW (*)    APPearance HAZY (*)    Leukocytes, UA 1+ (*)    Bacteria, UA RARE (*)     Squamous Epithelial / LPF 6-30 (*)    All other components within normal limits  CBC  LIPASE, BLOOD  URINALYSIS COMPLETEWITH MICROSCOPIC ([facility] ONLY)  POCT PREGNANCY, URINE    ____________________________________________   EKG  None  ____________________________________________    RADIOLOGY  CT abdomen and pelvis suspicious for ovarian cyst rupture  ____________________________________________   PROCEDURES  Procedure(s) performed: none  Critical Care performed: none  ____________________________________________   INITIAL IMPRESSION / ASSESSMENT AND PLAN / ED COURSE  Pertinent labs & imaging results  that were available during my care of the patient were reviewed by me and considered in my medical decision making (see chart for details).  Patient presents with right lower quadrant tenderness to palpation. History of present illness is not particular consistent with appendicitis however she has significant tender in the right lower quadrant so we will obtain CT abdomen pelvis  CT abdomen and pelvis shows normal appendix. Likely cyst rupture as cause of pain. Discussed with the patient. She is feeling better without treatment. We will discharge home, she knows to return if worsening pain.  ____________________________________________   FINAL CLINICAL IMPRESSION(S) / ED DIAGNOSES  Final diagnoses:  Ruptured ovarian cyst          Jene Every, MD 10/31/15 [phone]

## 2015-12-13 ENCOUNTER — Encounter: Payer: Self-pay | Admitting: Internal Medicine

## 2016-01-23 ENCOUNTER — Ambulatory Visit (INDEPENDENT_AMBULATORY_CARE_PROVIDER_SITE_OTHER): Payer: BC Managed Care – PPO | Admitting: Family Medicine

## 2016-01-23 ENCOUNTER — Encounter: Payer: Self-pay | Admitting: Family Medicine

## 2016-01-23 VITALS — BP 110/64 | HR 94 | Temp 98.2°F | Ht 63.0 in | Wt 132.8 lb

## 2016-01-23 DIAGNOSIS — T148 Other injury of unspecified body region: Secondary | ICD-10-CM

## 2016-01-23 DIAGNOSIS — W57XXXA Bitten or stung by nonvenomous insect and other nonvenomous arthropods, initial encounter: Secondary | ICD-10-CM | POA: Insufficient documentation

## 2016-01-23 NOTE — Assessment & Plan Note (Signed)
New problem. Not infected. Advised supportive care.

## 2016-01-23 NOTE — Progress Notes (Signed)
Pre visit review using our clinic review tool, if applicable. No additional management support is needed unless otherwise documented below in the visit note. 

## 2016-01-23 NOTE — Progress Notes (Signed)
   Subjective:  Patient ID: Alejandra Cook, female    DOB: February 13, 1981  Age: 35 y.o. MRN: [id number]  CC: Bites  HPI:  35 year old female presents with complaints of "bites".  Patient was bit by a tick last Tuesday on the back of her left leg. She states she subsequently developed for additional bites and is unsure of where they came from. Minimal surrounding erythema. No fluctuance. No drainage. No associated fever or chills. No other complaints at this time. She's not tried any medication or put anything on these areas.  Social Hx   Social History   Social History  . Marital Status: Married    Spouse Name: N/A  . Number of Children: N/A  . Years of Education: N/A   Social History Main Topics  . Smoking status: Never Smoker   . Smokeless tobacco: Never Used  . Alcohol Use: No  . Drug Use: No  . Sexual Activity: Yes   Other Topics Concern  . None   Social History Narrative   [place] in [place] 2YO daughter.      Works- special ED, [employer]   Review of Systems  Constitutional: Negative.   Skin:       Bug bites.    Objective:  BP 110/64 mmHg  Pulse 94  Temp(Src) 98.2 F (36.8 C) (Oral)  Ht 5\' 3"  (1.6 m)  Wt 132 lb 12 oz (60.215 kg)  BMI 23.52 kg/m2  SpO2 98%  BP/Weight 01/23/2016 10/31/2015 08/15/2015  Systolic BP 110 117 115  Diastolic BP 64 74 71  Wt. (Lbs) 132.75 135 145.38  BMI 23.52 23.92 25.76   Physical Exam  Constitutional: She is oriented to person, place, and time. She appears well-developed. No distress.  Pulmonary/Chest: Effort normal.  Neurological: She is alert and oriented to person, place, and time.  Skin:  4 small erythematous areas with central eschar located on the lateral aspect of the left leg. No fluctuance, drainage, induration.  Psychiatric: She has a normal mood and affect.  Vitals reviewed.  Lab Results  Component Value Date   WBC 8.0 10/31/2015   HGB 14.6 10/31/2015   HCT 42.7 10/31/2015   PLT 218 10/31/2015   GLUCOSE 85 10/31/2015   CHOL 151 01/21/2013   TRIG 67.0 01/21/2013   HDL 57.10 01/21/2013   LDLCALC 81 01/21/2013   ALT 12* 10/31/2015   AST 22 10/31/2015   NA 135 10/31/2015   K 3.2* 10/31/2015   CL 109 10/31/2015   CREATININE 0.74 10/31/2015   BUN 15 10/31/2015   CO2 19* 10/31/2015   TSH 0.75 10/21/2013   INR 1.1* 06/09/2012   Assessment & Plan:   Problem List Items Addressed This Visit    Bug bites - Primary    New problem. Not infected. Advised supportive care.        Follow-up: PRN  Everlene OtherJayce Aurelie Dicenzo DO [facility]

## 2016-01-31 ENCOUNTER — Ambulatory Visit (INDEPENDENT_AMBULATORY_CARE_PROVIDER_SITE_OTHER): Payer: BC Managed Care – PPO | Admitting: Family

## 2016-01-31 ENCOUNTER — Encounter: Payer: Self-pay | Admitting: Family

## 2016-01-31 VITALS — BP 98/70 | HR 80 | Temp 98.5°F | Ht 63.0 in | Wt 131.0 lb

## 2016-01-31 DIAGNOSIS — G43801 Other migraine, not intractable, with status migrainosus: Secondary | ICD-10-CM | POA: Diagnosis not present

## 2016-01-31 MED ORDER — KETOROLAC TROMETHAMINE 60 MG/2ML IM SOLN: 30.0000 mg | Freq: Once | INTRAMUSCULAR | Status: DC

## 2016-01-31 MED ORDER — KETOROLAC TROMETHAMINE 60 MG/2ML IM SOLN
60.0000 mg | Freq: Once | INTRAMUSCULAR | Status: AC
  Administered 2016-01-31: 60 mg via INTRAMUSCULAR

## 2016-01-31 NOTE — Patient Instructions (Addendum)
Pleasure meeting you. Hope this finally breaks the headache cycle.  If there is no improvement in your symptoms, or if there is any worsening of symptoms, or if you have any additional concerns, please return for re-evaluation; or, if we are closed, consider going to the Emergency Room for evaluation if symptoms urgent.  Migraine Headache A migraine headache is an intense, throbbing pain on one or both sides of your head. A migraine can last for 30 minutes to several hours. CAUSES  The exact cause of a migraine headache is not always known. However, a migraine may be caused when nerves in the brain become irritated and release chemicals that cause inflammation. This causes pain. Certain things may also trigger migraines, such as:  Alcohol.  Smoking.  Stress.  Menstruation.  Aged cheeses.  Foods or drinks that contain nitrates, glutamate, aspartame, or tyramine.  Lack of sleep.  Chocolate.  Caffeine.  Hunger.  Physical exertion.  Fatigue.  Medicines used to treat chest pain (nitroglycerine), birth control pills, estrogen, and some blood pressure medicines. SIGNS AND SYMPTOMS  Pain on one or both sides of your head.  Pulsating or throbbing pain.  Severe pain that prevents daily activities.  Pain that is aggravated by any physical activity.  Nausea, vomiting, or both.  Dizziness.  Pain with exposure to bright lights, loud noises, or activity.  General sensitivity to bright lights, loud noises, or smells. Before you get a migraine, you may get warning signs that a migraine is coming (aura). An aura may include:  Seeing flashing lights.  Seeing bright spots, halos, or zigzag lines.  Having tunnel vision or blurred vision.  Having feelings of numbness or tingling.  Having trouble talking.  Having muscle weakness. DIAGNOSIS  A migraine headache is often diagnosed based on:  Symptoms.  Physical exam.  A CT scan or MRI of your head. These imaging tests  cannot diagnose migraines, but they can help rule out other causes of headaches. TREATMENT Medicines may be given for pain and nausea. Medicines can also be given to help prevent recurrent migraines.  HOME CARE INSTRUCTIONS  Only take over-the-counter or prescription medicines for pain or discomfort as directed by your health care provider. The use of long-term narcotics is not recommended.  Lie down in a dark, quiet room when you have a migraine.  Keep a journal to find out what may trigger your migraine headaches. For example, write down:  What you eat and drink.  How much sleep you get.  Any change to your diet or medicines.  Limit alcohol consumption.  Quit smoking if you smoke.  Get 7-9 hours of sleep, or as recommended by your health care provider.  Limit stress.  Keep lights dim if bright lights bother you and make your migraines worse. SEEK IMMEDIATE MEDICAL CARE IF:   Your migraine becomes severe.  You have a fever.  You have a stiff neck.  You have vision loss.  You have muscular weakness or loss of muscle control.  You start losing your balance or have trouble walking.  You feel faint or pass out.  You have severe symptoms that are different from your first symptoms. MAKE SURE YOU:   Understand these instructions.  Will watch your condition.  Will get help right away if you are not doing well or get worse.   This information is not intended to replace advice given to you by your health care provider. Make sure you discuss any questions you have with your health  care provider.   Document Released: 07/23/2005 Document Revised: 08/13/2014 Document Reviewed: 03/30/2013 Elsevier Interactive Patient Education [employer].

## 2016-01-31 NOTE — Progress Notes (Signed)
Subjective:    Patient ID: Alejandra Cook, female    DOB: [birth year], 35 y.o.   MRN: [id number]   Alejandra Cook is a 35 y.o. female who presents today for an acute visit.    HPI Comments: Patient presents for evaluation of headache per week and a half. Right frontal, throbbing, worsening. She follows w however provider is out of town, usually get trigger point injection.  Endorses nausea. Sensitive to sunlight. No dizziness, changes in vision, sinus congestion. History of migraines. No h/o cancer.  Per chart review, patient was treated with Toradol IM 30 mg 2015 however 3 days later was still having headache  Past Medical History  Diagnosis Date  . WUJWJXBJ(478.2Headache(784.0)     Dr. [facility]Freeman [facility]  . Degenerative disc disease, lumbar   . Bulging discs   . Fibromyalgia     after pregnancy, Followed by [facility]  . Allergy     Dr. Gwen PoundsKowalski, gold and nickel allergy  . Spontaneous abortion in second trimester Dec 2014  . Anxiety   . Depression     off of zoloft for 1 year, had problems with depression and anxiety after last child  . IBS (irritable bowel syndrome)   . Fibromyalgia   . Intrahepatic cholestasis of pregnancy in third trimester, antepartum    Allergies: Review of patient's allergies indicates no known allergies. Current Outpatient Prescriptions on File Prior to Visit  Medication Sig Dispense Refill  . rizatriptan (MAXALT) 10 MG tablet TAKE 1 TABLET AS NEEDED FOR MIGRAINE, MAY REPEAT ONCE IN 2 HOURS  2  . sertraline (ZOLOFT) 50 MG tablet Take 50 mg by mouth daily.  12  . zonisamide (ZONEGRAN) 25 MG capsule TAKE 8 CAPSULES BY MOUTH AT BEDTIME  2   No current facility-administered medications on file prior to visit.    Social History  Substance Use Topics  . Smoking status: Never Smoker   . Smokeless tobacco: Never Used  . Alcohol Use: No    Review of Systems  Constitutional: Negative for fever and chills.  Respiratory: Negative for cough.     Cardiovascular: Negative for chest pain and palpitations.  Gastrointestinal: Positive for nausea. Negative for vomiting.  Neurological: Positive for headaches. Negative for syncope, weakness and numbness.      Objective:    BP 98/70 mmHg  Pulse 80  Temp(Src) 98.5 F (36.9 C)  Ht 5\' 3"  (1.6 m)  Wt 131 lb (59.421 kg)  BMI 23.21 kg/m2  SpO2 98%  LMP 01/11/2016   Physical Exam  Constitutional: She appears well-developed and well-nourished.  HENT:  Mouth/Throat: Uvula is midline, oropharynx is clear and moist and mucous membranes are normal.  Eyes: Conjunctivae and EOM are normal. Pupils are equal, round, and reactive to light.  Fundus normal bilaterally.   Cardiovascular: Normal rate, regular rhythm, normal heart sounds and normal pulses.   Pulmonary/Chest: Effort normal and breath sounds normal. She has no wheezes. She has no rhonchi. She has no rales.  Neurological: She is alert. She has normal strength. No cranial nerve deficit or sensory deficit. She displays a negative Romberg sign.  Reflex Scores:      Bicep reflexes are 2+ on the right side and 2+ on the left side.      Patellar reflexes are 2+ on the right side and 2+ on the left side. Grip equal and strong bilateral upper extremities. Gait strong and steady. Able to perform rapid alternating movement without difficulty.   Skin: Skin  is warm and dry.  Psychiatric: She has a normal mood and affect. Her speech is normal and behavior is normal. Thought content normal.  Vitals reviewed.      Assessment & Plan:   1. Other migraine with status migrainosus, not intractable Reassured by normal neurologic exam. Headache quality and location similar to prior migraine headaches.As patient had 30 mg Toradol 2 years ago which did not abort headache, we jointly agreed on higher dose today of 60 mg Toradol IM injection. She will continue following with her neurologist.   - ketorolac (TORADOL) injection 60 mg; Inject 2 mLs (60 mg  total) into the muscle once.    I am having Alejandra Cook maintain her sertraline, zonisamide, and rizatriptan.   No orders of the defined types were placed in this encounter.     Start medications as prescribed and explained to patient on After Visit Summary ( AVS). Risks, benefits, and alternatives of the medications and treatment plan prescribed today were discussed, and patient expressed understanding.   Education regarding symptom management and diagnosis given to patient.   Follow-up:Plan follow-up and return precautions given if any worsening symptoms or change in condition.   Continue to follow with Wynona DoveWALKER,[name], MD for routine health maintenance.   Alejandra Cook and I agreed with plan.   Rennie PlowmanMargaret Arnett, FNP

## 2016-02-01 ENCOUNTER — Encounter: Payer: Self-pay | Admitting: Family

## 2016-02-01 DIAGNOSIS — R519 Headache, unspecified: Secondary | ICD-10-CM

## 2016-02-01 DIAGNOSIS — R51 Headache: Principal | ICD-10-CM

## 2016-02-01 NOTE — Telephone Encounter (Signed)
Spoke with patient about short trial of prednisone to see if would help HA. Trying acupuncture Friday and wanted to see how it would work. Declined medication. Stated she would call her neurologist tomorrow and make an appt.

## 2016-04-26 ENCOUNTER — Telehealth: Payer: Self-pay | Admitting: Internal Medicine

## 2016-04-26 NOTE — Telephone Encounter (Signed)
Left pt a vm to call office to sch flu shot. °

## 2016-05-16 ENCOUNTER — Ambulatory Visit
Admission: RE | Admit: 2016-05-16 | Discharge: 2016-05-16 | Disposition: A | Discharge status: Home / Self Care | Payer: BC Managed Care – PPO | Source: Ambulatory Visit | Attending: Family | Admitting: Family

## 2016-05-16 ENCOUNTER — Encounter: Payer: Self-pay | Admitting: Family

## 2016-05-16 ENCOUNTER — Telehealth: Payer: Self-pay | Admitting: Family

## 2016-05-16 ENCOUNTER — Ambulatory Visit (INDEPENDENT_AMBULATORY_CARE_PROVIDER_SITE_OTHER): Payer: BC Managed Care – PPO | Admitting: Family

## 2016-05-16 VITALS — BP 114/58 | HR 83 | Temp 98.6°F | Ht 63.0 in | Wt 128.6 lb

## 2016-05-16 DIAGNOSIS — R109 Unspecified abdominal pain: Secondary | ICD-10-CM | POA: Insufficient documentation

## 2016-05-16 DIAGNOSIS — N83202 Unspecified ovarian cyst, left side: Secondary | ICD-10-CM | POA: Diagnosis not present

## 2016-05-16 LAB — POCT URINALYSIS DIPSTICK
Bilirubin, UA: NEGATIVE
Glucose, UA: NEGATIVE
KETONES UA: NEGATIVE
LEUKOCYTES UA: NEGATIVE
Nitrite, UA: NEGATIVE
PROTEIN UA: NEGATIVE
Spec Grav, UA: 1.02
UROBILINOGEN UA: 0.2
pH, UA: 5.5

## 2016-05-16 MED ORDER — CIPROFLOXACIN HCL 500 MG PO TABS: 500.0000 mg | ORAL_TABLET | Freq: Two times a day (BID) | ORAL | 0 refills | Status: AC

## 2016-05-16 NOTE — Progress Notes (Signed)
Subjective:    Patient ID: Alejandra Cook, female    DOB: 1981/07/23, 35 y.o.   MRN: [id number]  CC: Alejandra Cook is a 35 y.o. female who presents today for an acute visit.    HPI: Patient here for acute visit with chief complaint of left flank pain x 3 days, unchanged. Pain remains the same location, is radiating to left side. Endorses nausea. No fever, chills, vomiting,  abdominal pain.  Having regular BMs. Patient is currently on menstrual cycle.  No concern for STDs. h/o renal stone on left side, s/p lithotripsy and temporary stent, she is still established with [facility].     HISTORY:  Past Medical History:  Diagnosis Date  . Allergy    Dr. Gwen PoundsKowalski, gold and nickel allergy  . Anxiety   . Bulging discs   . Degenerative disc disease, lumbar   . Depression    off of zoloft for 1 year, had problems with depression and anxiety after last child  . Fibromyalgia    after pregnancy, Followed by [facility]  . Fibromyalgia   . WUJWJXBJ(478.2Headache(784.0)    Dr. [facility]Freeman at [facility]  . IBS (irritable bowel syndrome)   . 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 Spontaneous abortion in second trimester Dec 2014   Past Surgical History:  Procedure Laterality Date  . COLONOSCOPY     normal  . COLPOSCOPY  2002  . CYSTOSCOPY     ureteral stent inserted  . DILATION AND EVACUATION N/A 08/07/2013   Procedure: DILATATION AND EVACUATION;  Surgeon: Zelphia CairoGretchen Adkins, MD;  Location: [facility];  Service: Gynecology;  Laterality: N/A;  . LAPAROSCOPY N/A 03/20/2013   Procedure: LAPAROSCOPY OPERATIVE  IUD REMOVAL;  Surgeon: Zelphia CairoGretchen Adkins, MD;  Location: [facility];  Service: Gynecology;  Laterality: N/A;  Intraabdominal from [place]  . NASAL SINUS SURGERY    . PILONIDAL CYST EXCISION    . VAGINAL DELIVERY  2011   Family History  Problem Relation Age of Onset  . Hyperlipidemia Mother   . Hypertension Father   . Cancer Maternal Grandmother     cancer, breast    . Cancer Paternal Grandmother     breast  . Hypertension Paternal Grandfather     Allergies: Review of patient's allergies indicates no known allergies. Current Outpatient Prescriptions on File Prior to Visit  Medication Sig Dispense Refill  . rizatriptan (MAXALT) 10 MG tablet TAKE 1 TABLET AS NEEDED FOR MIGRAINE, MAY REPEAT ONCE IN 2 HOURS  2  . sertraline (ZOLOFT) 50 MG tablet Take 50 mg by mouth daily.  12  . zonisamide (ZONEGRAN) 25 MG capsule TAKE 8 CAPSULES BY MOUTH AT BEDTIME  2   No current facility-administered medications on file prior to visit.     Social History  Substance Use Topics  . Smoking status: Never Smoker  . Smokeless tobacco: Never Used  . Alcohol use No    Review of Systems  Constitutional: Negative for chills and fever.  Respiratory: Negative for cough.   Cardiovascular: Negative for chest pain and palpitations.  Gastrointestinal: Negative for nausea and vomiting.  Genitourinary: Positive for flank pain. Negative for dysuria, frequency and hematuria.      Objective:    BP (!) 114/58   Pulse 83   Temp 98.6 F (37 C) (Oral)   Ht 5\' 3"  (1.6 m)   Wt 128 lb 9.6 oz (58.3 kg)   LMP 05/15/2016   SpO2 100%   BMI  22.78 kg/m    Physical Exam  Constitutional: She appears well-developed and well-nourished.  Cardiovascular: Normal rate, regular rhythm, normal heart sounds and normal pulses.   Pulmonary/Chest: Effort normal and breath sounds normal. She has no wheezes. She has no rhonchi. She has no rales.  Abdominal: There is CVA tenderness (left).  Neurological: She is alert.  Skin: Skin is warm and dry.  Psychiatric: She has a normal mood and affect. Her speech is normal and behavior is normal. Thought content normal.  Vitals reviewed.      Assessment & Plan:   1. Flank pain UA is negative for white blood cells, nitrites. Small blood however patient is on menstrual cycle at this time.Patient had exquisite left-sided CVA tenderness. Concern for  renal stone. Pending stat CT renal per protocol. Advised patient to go ahead and give a call to her urologist to schedule appointment for the morning.  - POCT Urinalysis Dipstick - Urine Culture - CT RENAL STONE STUDY - ciprofloxacin (CIPRO) 500 MG tablet; Take 1 tablet (500 mg total) by mouth 2 (two) times daily.  Dispense: 20 tablet; Refill: 0    I am having Ms. [name] start on ciprofloxacin. I am also having her maintain her sertraline, zonisamide, and rizatriptan.   Meds ordered this encounter  Medications  . ciprofloxacin (CIPRO) 500 MG tablet    Sig: Take 1 tablet (500 mg total) by mouth 2 (two) times daily.    Dispense:  20 tablet    Refill:  0    Order Specific Question:   Supervising Provider    Answer:   Sherlene Shams [[id number]]    Return precautions given.   Risks, benefits, and alternatives of the medications and treatment plan prescribed today were discussed, and patient expressed understanding.   Education regarding symptom management and diagnosis given to patient on AVS.  Continue to follow with Wynona Dove, MD for routine health maintenance.   Alejandra Cook and I agreed with plan.   Rennie Plowman, FNP

## 2016-05-16 NOTE — Telephone Encounter (Signed)
Called patient and discussed CT.   No renal stone. She may hold off on antibiotics until urine culture returns.   Right ovarian cyst. Offered to order vaginal US and patient declined. She preferred to call OB GYN to follow up.   All questions answered.

## 2016-05-16 NOTE — Progress Notes (Signed)
Pre visit review using our clinic review tool, if applicable. No additional management support is needed unless otherwise documented below in the visit note. 

## 2016-05-16 NOTE — Patient Instructions (Signed)
Fluids.   If pain worsens or you have fever, please go to ed.  If there is no improvement in your symptoms, or if there is any worsening of symptoms, or if you have any additional concerns, please return for re-evaluation; or, if we are closed, consider going to the Emergency Room for evaluation if symptoms urgent.   Renal Colic Renal colic is pain that is caused by passing a kidney stone. The pain can be sharp and severe. It may be felt in the back, abdomen, side (flank), or groin. It can cause nausea. Renal colic can come and go. HOME CARE INSTRUCTIONS Watch your condition for any changes. The following actions may help to lessen any discomfort that you are feeling:  Take medicines only as directed by your health care provider.  Ask your health care provider if it is okay to take over-the-counter pain medicine.  Drink enough fluid to keep your urine clear or pale yellow. Drink 6-8 glasses of water each day.  Limit the amount of salt that you eat to less than 2 grams per day.  Reduce the amount of protein in your diet. Eat less meat, fish, nuts, and dairy.  Avoid foods such as spinach, rhubarb, nuts, or bran. These may make kidney stones more likely to form. SEEK MEDICAL CARE IF:  You have a fever or chills.  Your urine smells bad or looks cloudy.  You have pain or burning when you pass urine. SEEK IMMEDIATE MEDICAL CARE IF:  Your flank pain or groin pain suddenly worsens.  You become confused or disoriented or you lose consciousness.   This information is not intended to replace advice given to you by your health care provider. Make sure you discuss any questions you have with your health care provider.   Document Released: 05/02/2005 Document Revised: 08/13/2014 Document Reviewed: 06/02/2014 Elsevier Interactive Patient Education [employer]2016 [employer]

## 2016-05-18 LAB — URINE CULTURE: Organism ID, Bacteria: NO GROWTH

## 2016-09-18 ENCOUNTER — Encounter: Payer: Self-pay | Admitting: Family

## 2016-09-18 ENCOUNTER — Ambulatory Visit (INDEPENDENT_AMBULATORY_CARE_PROVIDER_SITE_OTHER): Payer: BC Managed Care – PPO | Admitting: Family

## 2016-09-18 VITALS — BP 120/78 | HR 89 | Temp 98.1°F | Ht 63.0 in | Wt 127.2 lb

## 2016-09-18 DIAGNOSIS — J029 Acute pharyngitis, unspecified: Secondary | ICD-10-CM | POA: Diagnosis not present

## 2016-09-18 LAB — POCT INFLUENZA A/B
INFLUENZA A, POC: NEGATIVE
INFLUENZA B, POC: NEGATIVE

## 2016-09-18 LAB — POCT RAPID STREP A (OFFICE): RAPID STREP A SCREEN: NEGATIVE

## 2016-09-18 NOTE — Progress Notes (Signed)
Subjective:    Patient ID: Alejandra Cook, female    DOB: [birth year], 36 y.o.   MRN: [id number]  CC: Alejandra Cook is a 36 y.o. female who presents today for an acute visit.    HPI: CC:  thick-colored sinus congestion, sore throat x 2 weeks, slight improvement. Endorses occasional cough, no wheezing, sob.  No fever. Had chills 3 days ago, resolved. On mucinex- sinus now with some improvement.    H/o seasonal allergies, non smoker  Follows with ent; did two round of prednisone and amoxicillin, with moderate relief.   Hasn't been on any allergy medication. Considered allergy testing.   Teacher.     HISTORY:  Past Medical History:  Diagnosis Date  . Allergy    Dr. Gwen PoundsKowalski, gold and nickel allergy  . Anxiety   . Bulging discs   . Degenerative disc disease, lumbar   . Depression    off of zoloft for 1 year, had problems with depression and anxiety after last child  . Fibromyalgia    after pregnancy, Followed by [facility]  . Fibromyalgia   . NWGNFAOZ(308.6Headache(784.0)    Dr. [facility]Freeman at [facility]  . IBS (irritable bowel syndrome)   . 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 Spontaneous abortion in second trimester Dec 2014   Past Surgical History:  Procedure Laterality Date  . COLONOSCOPY     normal  . COLPOSCOPY  2002  . CYSTOSCOPY     ureteral stent inserted  . DILATION AND EVACUATION N/A 08/07/2013   Procedure: DILATATION AND EVACUATION;  Surgeon: Zelphia CairoGretchen Adkins, MD;  Location: [facility];  Service: Gynecology;  Laterality: N/A;  . LAPAROSCOPY N/A 03/20/2013   Procedure: LAPAROSCOPY OPERATIVE  IUD REMOVAL;  Surgeon: Zelphia CairoGretchen Adkins, MD;  Location: [facility];  Service: Gynecology;  Laterality: N/A;  Intraabdominal from [facility]  . NASAL SINUS SURGERY    . PILONIDAL CYST EXCISION    . VAGINAL DELIVERY  2011   Family History  Problem Relation Age of Onset  . Hyperlipidemia Mother   . Hypertension Father   . Cancer Maternal Grandmother    cancer, breast  . Cancer Paternal Grandmother     breast  . Hypertension Paternal Grandfather     Allergies: Patient has no known allergies. Current Outpatient Prescriptions on File Prior to Visit  Medication Sig Dispense Refill  . rizatriptan (MAXALT) 10 MG tablet TAKE 1 TABLET AS NEEDED FOR MIGRAINE, MAY REPEAT ONCE IN 2 HOURS  2  . sertraline (ZOLOFT) 50 MG tablet Take 50 mg by mouth daily.  12  . zonisamide (ZONEGRAN) 25 MG capsule TAKE 8 CAPSULES BY MOUTH AT BEDTIME  2   No current facility-administered medications on file prior to visit.     Social History  Substance Use Topics  . Smoking status: Never Smoker  . Smokeless tobacco: Never Used  . Alcohol use No    Review of Systems  Constitutional: Negative for chills and fever.  HENT: Positive for congestion, postnasal drip and sore throat. Negative for sinus pain and sinus pressure.   Respiratory: Positive for cough. Negative for shortness of breath and wheezing.   Cardiovascular: Negative for chest pain and palpitations.  Gastrointestinal: Negative for nausea and vomiting.      Objective:    BP 120/78   Pulse 89   Temp 98.1 F (36.7 C) (Oral)   Ht 5\' 3"  (1.6 m)   Wt 127 lb 3.2 oz (57.7 kg)   SpO2  99%   BMI 22.53 kg/m    Physical Exam  Constitutional: She appears well-developed and well-nourished.  HENT:  Head: Normocephalic and atraumatic.  Right Ear: Hearing, tympanic membrane, external ear and ear canal normal. No drainage, swelling or tenderness. No foreign bodies. Tympanic membrane is not erythematous and not bulging. No middle ear effusion. No decreased hearing is noted.  Left Ear: Hearing, tympanic membrane, external ear and ear canal normal. No drainage, swelling or tenderness. No foreign bodies. Tympanic membrane is not erythematous and not bulging.  No middle ear effusion. No decreased hearing is noted.  Nose: Nose normal. No rhinorrhea. Right sinus exhibits no maxillary sinus tenderness and no  frontal sinus tenderness. Left sinus exhibits no maxillary sinus tenderness and no frontal sinus tenderness.  Mouth/Throat: Uvula is midline and mucous membranes are normal. Posterior oropharyngeal erythema present. No oropharyngeal exudate, posterior oropharyngeal edema or tonsillar abscesses.  Eyes: Conjunctivae are normal.  Cardiovascular: Regular rhythm, normal heart sounds and normal pulses.   Pulmonary/Chest: Effort normal and breath sounds normal. She has no wheezes. She has no rhonchi. She has no rales.  Lymphadenopathy:       Head (right side): No submental, no submandibular, no tonsillar, no preauricular, no posterior auricular and no occipital adenopathy present.       Head (left side): No submental, no submandibular, no tonsillar, no preauricular, no posterior auricular and no occipital adenopathy present.    She has no cervical adenopathy.  Neurological: She is alert.  Skin: Skin is warm and dry.  Psychiatric: She has a normal mood and affect. Her speech is normal and behavior is normal. Thought content normal.  Vitals reviewed.      Assessment & Plan:  1. Sore throat Neg strep and flu. Reassured to see improvement since this past weekend. Patient and I jointly decided to continue conservative management with mucinx ( plain) and not better , we will do another antibiotic ( recently on amoxicillin). Advised to pursue allergy testing with ENT - POCT Influenza A/B - POCT rapid strep A     I am having Alejandra Cook maintain her sertraline, zonisamide, and rizatriptan.   No orders of the defined types were placed in this encounter.   Return precautions given.   Risks, benefits, and alternatives of the medications and treatment plan prescribed today were discussed, and patient expressed understanding.   Education regarding symptom management and diagnosis given to patient on AVS.  Continue to follow with Wynona Dove, MD for routine health maintenance.   Alejandra Cook and I agreed with plan.   Rennie Plowman, FNP

## 2016-09-18 NOTE — Patient Instructions (Addendum)
Glad to see some improvement   Stay the course with mucinex and let me know if not better and we can start a different antibiotic  Increase intake of clear fluids. Congestion is best treated by hydration, when mucus is wetter, it is thinner, less sticky, and easier to expel from the body, either through coughing up drainage, or by blowing your nose.   Get plenty of rest.   Use saline nasal drops and blow your nose frequently. Run a humidifier at night and elevate the head of the bed. Vicks Vapor rub will help with congestion and cough. Steam showers and sinus massage for congestion.   Use Acetaminophen or Ibuprofen as needed for fever or pain. Avoid second hand smoke. Even the smallest exposure will worsen symptoms.    You can also try a teaspoon of honey to see if this will help reduce cough. Throat lozenges can sometimes be beneficial as well.    This illness will typically last 7 - 10 days.   Please follow up with our clinic if you develop a fever greater than 101 F, symptoms worsen, or do not resolve in the next week.

## 2016-09-18 NOTE — Progress Notes (Signed)
Pre visit review using our clinic review tool, if applicable. No additional management support is needed unless otherwise documented below in the visit note. 

## 2016-09-20 ENCOUNTER — Telehealth: Payer: Self-pay | Admitting: Internal Medicine

## 2016-09-20 ENCOUNTER — Encounter: Payer: Self-pay | Admitting: Family

## 2016-09-20 DIAGNOSIS — J0110 Acute frontal sinusitis, unspecified: Secondary | ICD-10-CM

## 2016-09-20 MED ORDER — CEPHALEXIN 500 MG PO CAPS: 500.0000 mg | ORAL_CAPSULE | Freq: Two times a day (BID) | ORAL | 0 refills | Status: DC

## 2016-09-20 NOTE — Telephone Encounter (Signed)
Pt called and stated that she saw Arnett on 2/13 and was advised to call back with continuing issues. Pt is still c/o sinus pressure and teeth pain and sinus headache. Please advise, thank you!  Call pt @ [phone]  Pharmacy - [id number], [place] - [address]

## 2016-09-20 NOTE — Telephone Encounter (Signed)
Using plain Mucinex , sinus drainage green , sinus pressure , sinus headache, teeth pain.  Please advise.

## 2016-09-28 ENCOUNTER — Encounter: Payer: Self-pay | Admitting: Family

## 2016-10-04 ENCOUNTER — Telehealth: Payer: Self-pay | Admitting: Family

## 2016-10-04 NOTE — Telephone Encounter (Signed)
Call pt  Confused - has she gotten all her meds? Does she need more refills?

## 2016-10-04 NOTE — Telephone Encounter (Signed)
Unable to leave VM due to VM being full. 

## 2016-10-09 NOTE — Telephone Encounter (Signed)
LMTCB

## 2016-10-11 NOTE — Telephone Encounter (Signed)
Left message for patient to return call back.  

## 2016-12-20 DIAGNOSIS — Z8719 Personal history of other diseases of the digestive system: Secondary | ICD-10-CM | POA: Insufficient documentation

## 2016-12-20 DIAGNOSIS — R5383 Other fatigue: Secondary | ICD-10-CM | POA: Insufficient documentation

## 2016-12-20 DIAGNOSIS — Z8669 Personal history of other diseases of the nervous system and sense organs: Secondary | ICD-10-CM | POA: Insufficient documentation

## 2016-12-20 DIAGNOSIS — Z8709 Personal history of other diseases of the respiratory system: Secondary | ICD-10-CM | POA: Insufficient documentation

## 2016-12-20 DIAGNOSIS — Z87442 Personal history of urinary calculi: Secondary | ICD-10-CM | POA: Insufficient documentation

## 2016-12-20 DIAGNOSIS — F5101 Primary insomnia: Secondary | ICD-10-CM | POA: Insufficient documentation

## 2016-12-20 NOTE — Progress Notes (Signed)
Office Visit Note  Patient: Alejandra Cook             Date of Birth: 12-04-1980           MRN: [id number]             PCP: Patient, No Pcp Per Referring: No ref. provider found Visit Date: 01/01/2017 Occupation: @GUAROCC @    Subjective:  Pain of the Right Elbow; Pain of the Left Elbow; Pain of the Right Hip; and Pain of the Left Hip   History of Present Illness: Alejandra Cook is a 36 y.o. female with history of fibromyalgia syndrome and this disease of lumbar spine she returns today after a long time. She states she had done quite well as regards to fibromyalgia and recently. In the last few weeks she's been experiencing increased pain and discomfort. She describes pain in her bilateral elbows and bilateral hip area. She has also had increased headaches for which she's been seeing neurologist. 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  Activities of Daily Living:  Patient reports morning stiffness for 0 minute.   Patient Denies nocturnal pain.  Difficulty dressing/grooming: Denies Difficulty climbing stairs: Denies Difficulty getting out of chair: Denies Difficulty using hands for taps, buttons, cutlery, and/or writing: Denies   Review of Systems  Constitutional: Positive for fever. Negative for fatigue, night sweats, weight gain, weight loss and weakness.  HENT: Positive for mouth dryness. Negative for mouth sores, trouble swallowing, trouble swallowing and nose dryness.   Eyes: Negative for pain, redness, visual disturbance and dryness.  Respiratory: Negative for cough, shortness of breath and difficulty breathing.   Cardiovascular: Negative for chest pain, palpitations, hypertension, irregular heartbeat and swelling in legs/feet.  Gastrointestinal: Negative for blood in stool, constipation and diarrhea.  Endocrine: Negative for increased urination.  Genitourinary: Negative for vaginal dryness.  Musculoskeletal: Positive for myalgias and myalgias.  Negative for arthralgias, joint pain, joint swelling, muscle weakness, morning stiffness and muscle tenderness.  Skin: Negative for color change, rash, hair loss, skin tightness, ulcers and sensitivity to sunlight.  Allergic/Immunologic: Negative for susceptible to infections.  Neurological: Negative for dizziness, memory loss and night sweats.  Hematological: Negative for swollen glands.  Psychiatric/Behavioral: Positive for depressed mood. Negative for sleep disturbance. The patient is nervous/anxious.     PMFS History:  Patient Active Problem List   Diagnosis Date Noted  . History of migraine 12/20/2016  . Other fatigue 12/20/2016  . Primary insomnia 12/20/2016  . History of IBS 12/20/2016  . History of renal calculi 12/20/2016  . History of sinusitis 12/20/2016  . Bug bites 01/23/2016  . Routine general medical examination at a health care facility 01/21/2013  . Fibromyalgia 01/17/2012  . Allergic rhinitis 01/17/2012  . Migraine headache 05/12/2007  . DEGENERATIVE DISC DISEASE, LUMBAR SPINE 05/12/2007    Past Medical History:  Diagnosis Date  . Allergy    Dr. Gwen Pounds, gold and nickel allergy  . Anxiety   . Bulging discs   . Degenerative disc disease, lumbar   . Depression    off of zoloft for 1 year, had problems with depression and anxiety after last child  . Fibromyalgia    after pregnancy, Followed by [facility]  . Fibromyalgia   . NWGNFAOZ(308.6)    Dr. Neale Burly at [facility]  . IBS (irritable bowel syndrome)   . 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 Spontaneous abortion in second trimester Dec 2014  Family History  Problem Relation Age of Onset  . Hyperlipidemia Mother   . Hypertension Father   . Cancer Maternal Grandmother        cancer, breast  . Cancer Paternal Grandmother        breast  . Hypertension Paternal Grandfather    Past Surgical History:  Procedure Laterality Date  . COLONOSCOPY     normal  .  COLPOSCOPY  2002  . CYSTOSCOPY     ureteral stent inserted  . DILATION AND EVACUATION N/A 08/07/2013   Procedure: DILATATION AND EVACUATION;  Surgeon: Zelphia CairoGretchen Adkins, MD;  Location: [facility];  Service: Gynecology;  Laterality: N/A;  . LAPAROSCOPY N/A 03/20/2013   Procedure: LAPAROSCOPY OPERATIVE  IUD REMOVAL;  Surgeon: Zelphia CairoGretchen Adkins, MD;  Location: [facility];  Service: Gynecology;  Laterality: N/A;  Intraabdominal from [place]  . NASAL SINUS SURGERY    . PILONIDAL CYST EXCISION    . VAGINAL DELIVERY  2011   Social History   Social History Narrative   Lives in [place]. 2YO daughter.      Works- [employer]; [employer]           Objective: Vital Signs: BP 108/60   Pulse 82   Resp 14   Ht 5\' 3"  (1.6 m)   Wt 128 lb (58.1 kg)   LMP 12/26/2016   BMI 22.67 kg/m    Physical Exam  Constitutional: She is oriented to person, place, and time. She appears well-developed and well-nourished.  HENT:  Head: Normocephalic and atraumatic.  Eyes: Conjunctivae and EOM are normal.  Neck: Normal range of motion.  Cardiovascular: Normal rate, regular rhythm, normal heart sounds and intact distal pulses.   Pulmonary/Chest: Effort normal and breath sounds normal.  Abdominal: Soft. Bowel sounds are normal.  Lymphadenopathy:    She has no cervical adenopathy.  Neurological: She is alert and oriented to person, place, and time.  Skin: Skin is warm and dry. Capillary refill takes less than 2 seconds.  Psychiatric: She has a normal mood and affect. Her behavior is normal.  Nursing note and vitals reviewed.    Musculoskeletal Exam: C-spine, thoracic, lumbar spine good range of motion. Shoulder joints elbow joints wrist joint MCPs PIPs DIPs with good range of motion with no synovitis hip joints knee joints ankles MTPs PIPs DIPs with good range of motion with no synovitis she had tenderness on palpation over bilateral lateral epicondyle area and bilateral trochanteric area consistent with trochanteric  bursitis.  CDAI Exam: No CDAI exam completed.    Investigation: Findings:       September 2012 showed while CBC, comprehensive metabolic panel, sedimentation rate, CK, TSH, SPEP, ANA and vitamin D were all within normal limits.    Imaging: No results found.  Speciality Comments: No specialty comments available.    Procedures:  Large Joint Inj Date/Time: 01/01/2017 1:41 PM Performed by: Pollyann SavoyEVESHWAR, Myeesha Shane Authorized by: Pollyann SavoyEVESHWAR, Laylee Schooley   Consent Given by:  Patient Site marked: the procedure site was marked   Timeout: prior to procedure the correct patient, procedure, and site was verified   Indications:  Pain Location:  Hip Site:  L greater trochanter Prep: patient was prepped and draped in usual sterile fashion   Needle Size:  27 G Needle Length:  1.5 inches Approach:  Lateral Ultrasound Guidance: No   Fluoroscopic Guidance: No   Arthrogram: No   Medications:  40 mg triamcinolone acetonide 40 MG/ML; 1.5 mL lidocaine 1 % Aspiration Attempted: No   Aspirate amount (mL):  0 Patient tolerance:  Patient tolerated the procedure well with no immediate complications Large Joint Inj Date/Time: 01/01/2017 1:43 PM Performed by: Pollyann Savoy Authorized by: Pollyann Savoy   Consent Given by:  Patient Site marked: the procedure site was marked   Timeout: prior to procedure the correct patient, procedure, and site was verified   Indications:  Pain Location:  Hip Site:  R greater trochanter Prep: patient was prepped and draped in usual sterile fashion   Needle Size:  27 G Needle Length:  1.5 inches Approach:  Lateral Ultrasound Guidance: No   Fluoroscopic Guidance: No   Arthrogram: No   Medications:  40 mg triamcinolone acetonide 40 MG/ML; 1.5 mL lidocaine 1 % Aspiration Attempted: No   Aspirate amount (mL):  0 Patient tolerance:  Patient tolerated the procedure well with no immediate complications   Allergies: Patient has no known allergies.   Assessment /  Plan:     Visit Diagnoses: Fibromyalgia: She has known history of fibromyalgia and had been doing fairly well until recently and had a flare now with increased pain and discomfort.  Primary insomnia: She has intermittent insomnia  Other fatigue: Fatigue probably related to chronic insomnia  Lateral epicondylitis of both elbows: Tennis elbow brace was discussed. I also discussed use of Voltaren gel which can be used topically.  Trochanteric bursitis of both hips: Different treatment options for bursitis was discussed. ITB and exercise were demonstrated in the office today. After informed consent was obtained bilateral trochanteric bursa were injected with cortisone as described above.  DDD Lumbar spine: She had some chronic lower back pain.  History of migraine: She's having increased migraines and has been seeing urologist.  History of IBS  History of renal calculi  History of sinusitis    Orders: No orders of the defined types were placed in this encounter.  No orders of the defined types were placed in this encounter.   Face-to-face time spent with patient was 30 minutes. 50% of time was spent in counseling and coordination of care.  Follow-Up Instructions: Return in about 6 months (around 07/04/2017) for FMS,DDD Lspine.   Pollyann Savoy, MD  Note - This record has been created using Animal nutritionist.  Chart creation errors have been sought, but may not always  have been located. Such creation errors do not reflect on  the standard of medical care.

## 2017-01-01 ENCOUNTER — Ambulatory Visit (INDEPENDENT_AMBULATORY_CARE_PROVIDER_SITE_OTHER): Payer: BC Managed Care – PPO | Admitting: Rheumatology

## 2017-01-01 ENCOUNTER — Encounter: Payer: Self-pay | Admitting: Rheumatology

## 2017-01-01 VITALS — BP 108/60 | HR 82 | Resp 14 | Ht 63.0 in | Wt 128.0 lb

## 2017-01-01 DIAGNOSIS — Z8709 Personal history of other diseases of the respiratory system: Secondary | ICD-10-CM | POA: Diagnosis not present

## 2017-01-01 DIAGNOSIS — M7062 Trochanteric bursitis, left hip: Secondary | ICD-10-CM | POA: Diagnosis not present

## 2017-01-01 DIAGNOSIS — Z8719 Personal history of other diseases of the digestive system: Secondary | ICD-10-CM | POA: Diagnosis not present

## 2017-01-01 DIAGNOSIS — M7711 Lateral epicondylitis, right elbow: Secondary | ICD-10-CM

## 2017-01-01 DIAGNOSIS — Z8669 Personal history of other diseases of the nervous system and sense organs: Secondary | ICD-10-CM

## 2017-01-01 DIAGNOSIS — Z87442 Personal history of urinary calculi: Secondary | ICD-10-CM

## 2017-01-01 DIAGNOSIS — R5383 Other fatigue: Secondary | ICD-10-CM

## 2017-01-01 DIAGNOSIS — M7061 Trochanteric bursitis, right hip: Secondary | ICD-10-CM

## 2017-01-01 DIAGNOSIS — M797 Fibromyalgia: Secondary | ICD-10-CM | POA: Diagnosis not present

## 2017-01-01 DIAGNOSIS — M7712 Lateral epicondylitis, left elbow: Secondary | ICD-10-CM | POA: Diagnosis not present

## 2017-01-01 DIAGNOSIS — M5137 Other intervertebral disc degeneration, lumbosacral region: Secondary | ICD-10-CM | POA: Diagnosis not present

## 2017-01-01 DIAGNOSIS — F5101 Primary insomnia: Secondary | ICD-10-CM | POA: Diagnosis not present

## 2017-01-01 MED ORDER — LIDOCAINE HCL 1 % IJ SOLN
1.5000 mL | INTRAMUSCULAR | Status: AC | PRN
  Administered 2017-01-01: 1.5 mL

## 2017-01-01 MED ORDER — TRIAMCINOLONE ACETONIDE 40 MG/ML IJ SUSP
40.0000 mg | INTRAMUSCULAR | Status: AC | PRN
  Administered 2017-01-01: 40 mg via INTRA_ARTICULAR

## 2017-01-01 NOTE — Patient Instructions (Addendum)
Natural anti-inflammatories  You can purchase these at [employer], [employer] or online.  . Turmeric (capsules)  . Ginger (ginger root or capsules)  . Omega 3 (Fish, flax seeds, chia seeds, walnuts, almonds)  . Tart cherry (dried or extract)   Patient should be under the care of a physician while taking these supplements. This may not be reproduced without the permission of Dr. Pollyann SavoyShaili Adonijah Baena.   Consider switching to Cymbalta Iliotibial Bursitis Rehab Ask your health care provider which exercises are safe for you. Do exercises exactly as told by your health care provider and adjust them as directed. It is normal to feel mild stretching, pulling, tightness, or discomfort as you do these exercises, but you should stop right away if you feel sudden pain or your pain gets worse.Do not begin these exercises until told by your health care provider. Stretching and range of motion exercises These exercises warm up your muscles and joints and improve the movement and flexibility of your leg. These exercises also help to relieve pain and stiffness. Exercise A: Quadriceps stretch, prone   1. Lie on your abdomen on a firm surface, such as a bed or padded floor. 2. Bend your left / right knee and hold your ankle. If you cannot reach your ankle or pant leg, loop a belt around your foot and grab the belt instead. 3. Gently pull your heel toward your buttocks. Your knee should not slide out to the side. You should feel a stretch in the front of your thigh and knee. 4. Hold this position for __________ seconds. Repeat __________ times. Complete this exercise __________ times a day. Exercise B: Lunge (  adductor stretch) 1. Stand and spread your legs about 3 feet (about 1 m) apart. Put your left / right leg slightly back for balance. 2. Lean away from your left / right leg by bending your other knee and shifting your weight toward your bent knee. You may rest your hands on your thigh for balance.  You should feel a stretch in your left / right inner thigh. 3. Hold for __________ seconds. Repeat __________ times. Complete this exercise __________ times a day. Exercise C: Hamstring stretch, supine  1. Lie on your back. 2. Hold both ends of a belt or towel as you loop it over the ball of your left / right foot. The ball of your foot is on the walking surface, right under your toes. 3. Straighten your left / right knee and slowly pull on the belt to raise your leg. Stop when you feel a gentle stretch in the back of your left / right knee or thigh.  Do not let your left / right knee bend.  Keep your other leg flat on the floor. 4. Hold this position for __________ seconds. Repeat __________ times. Complete this exercise __________ times a day. Strengthening exercises These exercises build strength and endurance in your leg. Endurance is the ability to use your muscles for a long time, even after they get tired. Exercise D: Quadriceps wall slides  1. Lean your back against a smooth wall or door while you walk your feet out 18-24 inches (46-61 cm) from it. 2. Place your feet hip-width apart. 3. Slowly slide down the wall or door until your knees bend as far as told by your health care provider. Keep your knees over your heels, not your toes. Keep your knees in line with your hips. 4. Hold for __________ seconds. 5. Push through your heels to stand  up to rest for __________ seconds after each repetition. Repeat __________ times. Complete this exercise __________ times a day. Exercise E: Straight leg raises ( hip abductors) 1. Lie on your side, with your left / right leg in the top position. Lie so your head, shoulder, knee, and hip line up with each other. You may bend your bottom knee to help you balance. 2. Lift your top leg 4-6 inches (10-15 cm) while keeping your toes pointed straight ahead. 3. Hold this position for __________ seconds. 4. Slowly lower your leg to the starting  position. Allow your muscles to relax completely after each repetition. Repeat __________ times. Complete this exercise __________ times a day. Exercise F: Straight leg raises ( hip extensors) 1. Lie on your abdomen on a firm surface. You can put a pillow under your hips if that is more comfortable. 2. Tense the muscles in your buttocks and lift your left / right leg about 4-6 inches (10-15 cm). Keep your knee straight as you lift your leg. 3. Hold this position for __________ seconds. 4. Slowly lower your leg to the starting position. 5. Let your leg relax completely after each repetition. Repeat __________ times. Complete this exercise __________ times a day. Exercise G: Bridge ( hip extensors) 1. Lie on your back on a firm surface with your knees bent and your feet flat on the floor. 2. Tighten your buttocks muscles and lift your bottom off the floor until your trunk is level with your thighs.  Do not arch your back.  You should feel the muscles working in your buttocks and the back of your thighs. If you do not feel these muscles, slide your feet 1-2 inches (2.5-5 cm) farther away from your buttocks. 3. Hold this position for __________ seconds. 4. Slowly lower your hips to the starting position. 5. Let your buttocks muscles relax completely between repetitions. 6. If this exercise is too easy, try doing it with your arms crossed over your chest. Repeat __________ times. Complete this exercise __________ times a day. This information is not intended to replace advice given to you by your health care provider. Make sure you discuss any questions you have with your health care provider. Document Released: 07/23/2005 Document Revised: 03/29/2016 Document Reviewed: 07/05/2015 Elsevier Interactive Patient Education  2017 [employer]  Elbow and Forearm Exercises Ask your health care provider which exercises are safe for you. Do exercises exactly as told by your health care provider and  adjust them as directed. It is normal to feel mild stretching, pulling, tightness, or discomfort as you do these exercises, but you should stop right away if you feel sudden pain or your pain gets worse.Do not begin these exercises until told by your health care provider. RANGE OF MOTION EXERCISES  These exercises warm up your muscles and joints and improve the movement and flexibility of your injured elbow and forearm. These exercises also help to relieve pain, numbness, and tingling.These exercises are done using the muscles in your injured elbow and forearm. Exercise A: Elbow Flexion, Active  5. Hold your left / right arm at your side, and bend your elbow as far as you can using your left / right arm muscles. 6. Hold this position for __________ seconds. 7. Slowly return to the starting position. Repeat __________ times. Complete this exercise __________ times a day. Exercise B: Elbow Extension, Active  4. Hold your left / right arm at your side, and straighten your elbow as much as you can using your  left / right arm muscles. 5. Hold this position for __________ seconds. 6. Slowly return to the starting position. Repeat __________ times. Complete this exercise __________ times a day. Exercise C: Forearm Rotation, Supination, Active  1. Stand or sit with your elbows at your sides. 2. Bend your left / right elbow to an "L" shape (90 degrees). 3. Turn your palm upward until you feel a gentle stretch on the inside of your forearm. 4. Hold this position for __________ seconds. 5. Slowly release and return to the starting position. Repeat __________ times. Complete this exercise __________ times a day. Exercise D: Forearm Rotation, Pronation, Active  6. Stand or sit with your elbows at your side. 7. Bend your left / right elbow to an "L" shape (90 degrees). 8. Turn your left / right palm downward until you feel a gentle stretch on the top of your forearm. 9. Hold this position for __________  seconds. 10. Slowly release and return to the starting position. Repeat__________ times. Complete this exercise __________ times a day. STRETCHING EXERCISES  These exercises warm up your muscles and joints and improve the movement and flexibility of your injured elbow and forearm. These exercises also help to relieve pain, numbness, and tingling.These exercises are done using your healthy elbow and forearm to help stretch the muscles in your injured elbow and forearm. Exercise E: Elbow Flexion, Active-Assisted   5. Hold your left / right arm at your side, and bend your elbow as much as you can using your left / right arm muscles. 6. Use your other hand to bend your left / right elbow farther. To do this, gently push up on your forearm until you feel a gentle stretch on the back of your elbow. 7. Hold this position for __________ seconds. 8. Slowly return to the starting position. Repeat __________ times. Complete this exercise __________ times a day. Exercise F: Elbow Extension, Active-Assisted   6. Hold your left / right arm at your side, and straighten your elbow as much as you can using your left / right arm muscles. 7. Use your other hand to straighten the left / right elbow farther. To do this, gently push down on your forearm until you feel a gentle stretch on the inside of your elbow. 8. Hold this position for __________ seconds. 9. Slowly return to the starting position. Repeat __________ times. Complete this exercise __________ times a day. Exercise G: Forearm Rotation, Supination, Active-Assisted   1. Sit with your left / right elbow bent in an "L" shape (90 degrees) with your forearm resting on a table. 2. Keeping your upper body and shoulder still, rotate your forearm so your left / right palm faces upward. 3. Use your other hand to help rotate your forearm further until you feel a gentle to moderate stretch. 4. Hold this position for __________ seconds. 5. Slowly release the  stretch and return to the starting position. Repeat __________ times. Complete this exercise __________ times a day. Exercise H: Forearm Rotation, Pronation, Active-Assisted   1. Sit with your left / right elbow bent in an "L" shape (90 degrees) with your forearm resting on a table. 2. Keeping your upper body and shoulder still, rotate your forearm so your palm faces the tabletop. 3. Use your other hand to help rotate your forearm further until you feel a gentle to moderate stretch. 4. Hold this position for __________ seconds. 5. Slowly release the stretch and return to the starting position. Repeat __________ times. Complete this exercise  __________ times a day. Exercise I: Elbow Flexion, Supine, Passive  1. Lie on your back. 2. Extend your left / right arm up in the air, bracing it with your other hand. 3. Let your left / right your hand slowly lower toward your shoulder, while your elbow stays pointed toward the ceiling. You should feel a gentle stretch along the back of your upper arm and elbow. 4. If instructed by your health care provider, you may increase the intensity of your stretch by adding a small wrist weight or hand weight. 5. Hold this position for __________ seconds. 6. Slowly return to the starting position. Repeat __________ times. Complete this exercise __________ times a day. Exercise J: Elbow Extension, Supine, Passive   1. Lie on your back. Make sure that you are in a comfortable position that lets you relax your arm muscles. 2. Place a folded towel under your left / right upper arm so your elbow and shoulder are at the same height. Straighten your left / right arm so your elbow does not rest on the bed or towel. 3. Let the weight of your hand stretch your elbow. Keep your arm and chest muscles relaxed. You should feel a stretch on the inside of your elbow. 4. If told by your health care provider, you may increase the intensity of your stretch by adding a small wrist  weight or hand weight. 5. Hold this position for__________ seconds. 6. Slowly release the stretch. Repeat __________ times. Complete this exercise __________ times a day. STRENGTHENING EXERCISES  These exercises build strength and endurance in your elbow and forearm. Endurance is the ability to use your muscles for a long time, even after they get tired. Exercise K: Elbow Flexion, Isometric   1. Stand or sit up straight. 2. Bend your left / right elbow in an "L" shape (90 degrees) and turn your palm up so your forearm is at the height of your waist. 3. Place your other hand on top of your forearm. Gently push down as your left / right arm resists. Push as hard as you can with both arms without causing any pain or movement at your left / right elbow. 4. Hold this position for __________ seconds. 5. Slowly release the tension in both arms. Let your muscles relax completely before repeating. Repeat __________ times. Complete this exercise __________ times a day. Exercise L: Elbow Extensors, Isometric   1. Stand or sit up straight. 2. Place your left / right arm so your palm faces your abdomen and it is at the height of your waist. 3. Place your other hand on the underside of your forearm. Gently push up as your left / right arm resists. Push as hard as you can with both arms, without causing any pain or movement at your left / right elbow. 4. Hold this position for __________ seconds. 5. Slowly release the tension in both arms. Let your muscles relax completely before repeating. Repeat __________ times. Complete this exercise __________ times a day. Exercise M: Elbow Flexion With Forearm Palm Up   1. Sit upright on a firm chair without armrests, or stand. 2. Place your left / right arm at your side with your palm facing forward. 3. Holding a __________weight or gripping a rubber exercise band or tubing, bend your elbow to bring your hand toward your shoulder. 4. Hold this position for  __________ seconds. 5. Slowly return to the starting position. Repeat __________times. Complete this exercise __________times a day. Exercise N: Elbow Extension  1. Sit on a firm chair without armrests, or stand. 2. Keeping your upper arms at your sides, bring both hands up toward your left / right shoulder while you grip a rubber exercise band or tubing. Your left / right hand should be just below the other hand. 3. Straighten your left / right elbow. 4. Hold this position for __________ seconds. 5. Control the resistance of the band or tubing as your hand returns to your side. Repeat __________times. Complete this exercise __________times a day. Exercise O: Forearm Rotation, Supination   1. Sit with your left / right forearm supported on a table. Keep your elbow at waist height. 2. Rest your hand over the edge of the table with your palm facing down. 3. Gently hold a lightweight hammer. 4. Without moving your elbow, slowly rotate your forearm to turn your palm and hand upward to a "thumbs-up" position. 5. Hold this position for __________ seconds. 6. Slowly return to the starting position. Repeat __________times. Complete this exercise __________times a day. Exercise P: Forearm Rotation, Pronation   1. Sit with your left / right forearm supported on a table. Keep your elbow below shoulder height. 2. Rest your hand over the edge of the table with your palm facing up. 3. Gently hold a lightweight hammer. 4. Without moving your elbow, slowly rotate your forearm to turn your palm and hand upward to a "thumbs-up" position. 5. Hold this position for __________seconds. 6. Slowly return to the starting position. Repeat __________times. Complete this exercise __________times a day. This information is not intended to replace advice given to you by your health care provider. Make sure you discuss any questions you have with your health care provider. Document Released: 06/06/2005 Document  Revised: 12/01/2015 Document Reviewed: 04/17/2015 Elsevier Interactive Patient Education  2017 [employer].

## 2017-01-10 ENCOUNTER — Ambulatory Visit: Payer: BC Managed Care – PPO | Admitting: Family

## 2017-01-10 ENCOUNTER — Telehealth: Payer: Self-pay | Admitting: General Practice

## 2017-01-10 DIAGNOSIS — Z0289 Encounter for other administrative examinations: Secondary | ICD-10-CM

## 2017-01-10 NOTE — Telephone Encounter (Signed)
fyi

## 2017-01-10 NOTE — Telephone Encounter (Signed)
FYI - Pt called and cancelled appt. Pt went to urgent care last night.

## 2017-01-17 ENCOUNTER — Encounter: Payer: Self-pay | Admitting: Family

## 2017-01-17 ENCOUNTER — Ambulatory Visit (INDEPENDENT_AMBULATORY_CARE_PROVIDER_SITE_OTHER): Payer: BC Managed Care – PPO | Admitting: Family

## 2017-01-17 VITALS — BP 118/86 | HR 78 | Temp 98.5°F | Resp 16 | Ht 63.0 in | Wt 128.2 lb

## 2017-01-17 DIAGNOSIS — J40 Bronchitis, not specified as acute or chronic: Secondary | ICD-10-CM | POA: Diagnosis not present

## 2017-01-17 MED ORDER — ALBUTEROL SULFATE HFA 108 (90 BASE) MCG/ACT IN AERS: 2.0000 | INHALATION_SPRAY | Freq: Four times a day (QID) | RESPIRATORY_TRACT | 1 refills | Status: DC | PRN

## 2017-01-17 MED ORDER — PREDNISONE 10 MG PO TABS: ORAL_TABLET | ORAL | 0 refills | Status: DC

## 2017-01-17 NOTE — Patient Instructions (Signed)
As discussed  Expect viral- trial prednisone, albuterol.   Stay on flonase, claritin and add in nasal saline spray  We should consider singulair if allergic symptoms persists  Let me know if not better

## 2017-01-17 NOTE — Progress Notes (Signed)
Subjective:    Patient ID: Alejandra Cook, female    DOB: 1981/05/07, 36 y.o.   MRN: [id number]  CC: Alejandra Cook is a 36 y.o. female who presents today for an acute visit.    HPI: Chief complaint of sinus congestion, some improvement x 9 days.   Had been at [facility] 9 days ago for sore throat for 2 days- strep negative. Started on z pack and finished on 4 days ago. Started on delsym, flonase, claritin.  Sore throat improved on zpak. Still having thick congestion and cough after zpak. Coughing day and night. No wheezing, sob.   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       HISTORY:  Past Medical History:  Diagnosis Date  . Allergy    Dr. Gwen Pounds, gold and nickel allergy  . Anxiety   . Bulging discs   . Degenerative disc disease, lumbar   . Depression    off of zoloft for 1 year, had problems with depression and anxiety after last child  . Fibromyalgia    after pregnancy, Followed by [facility]  . Fibromyalgia   . XBJYNWGN(562.1)    Dr. Neale Burly at [facility]  . IBS (irritable bowel syndrome)   . 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 Spontaneous abortion in second trimester Dec 2014   Past Surgical History:  Procedure Laterality Date  . COLONOSCOPY     normal  . COLPOSCOPY  2002  . CYSTOSCOPY     ureteral stent inserted  . DILATION AND EVACUATION N/A 08/07/2013   Procedure: DILATATION AND EVACUATION;  Surgeon: Zelphia Cairo, MD;  Location: [facility];  Service: Gynecology;  Laterality: N/A;  . LAPAROSCOPY N/A 03/20/2013   Procedure: LAPAROSCOPY OPERATIVE  IUD REMOVAL;  Surgeon: Zelphia Cairo, MD;  Location: [facility];  Service: Gynecology;  Laterality: N/A;  Intraabdominal from [place]  . NASAL SINUS SURGERY    . PILONIDAL CYST EXCISION    . VAGINAL DELIVERY  2011   Family History  Problem Relation Age of Onset  . Hyperlipidemia Mother   .  Hypertension Father   . Cancer Maternal Grandmother        cancer, breast  . Cancer Paternal Grandmother        breast  . Hypertension Paternal Grandfather     Allergies: Patient has no known allergies. Current Outpatient Prescriptions on File Prior to Visit  Medication Sig Dispense Refill  . LO LOESTRIN FE 1 MG-10 MCG / 10 MCG tablet Take 1 tablet by mouth daily. as directed  9  . rizatriptan (MAXALT) 10 MG tablet TAKE 1 TABLET AS NEEDED FOR MIGRAINE, MAY REPEAT ONCE IN 2 HOURS  2  . sertraline (ZOLOFT) 100 MG tablet Take 100 mg by mouth daily.  2  . zonisamide (ZONEGRAN) 100 MG capsule TAKE 3 CAPSULES BY ORAL ROUTE ONCE A DAY (AT BEDTIME) FOR 30 DAYS  2   No current facility-administered medications on file prior to visit.     Social History  Substance Use Topics  . Smoking status: Never Smoker  . Smokeless tobacco: Never Used  . Alcohol use No    Review of Systems  Constitutional: Negative for chills and fever.  HENT: Positive for congestion and sinus pressure.   Respiratory: Negative for cough.   Cardiovascular: Negative for chest pain and palpitations.  Gastrointestinal: Negative for nausea and vomiting.  Objective:    BP 118/86 (BP Location: Left Arm, Patient Position: Sitting, Cuff Size: Normal)   Pulse 78   Temp 98.5 F (36.9 C) (Oral)   Resp 16   Ht 5\' 3"  (1.6 m)   Wt 128 lb 3.2 oz (58.2 kg)   LMP 12/26/2016   SpO2 99%   BMI 22.71 kg/m    Physical Exam  Constitutional: She appears well-developed and well-nourished.  HENT:  Head: Normocephalic and atraumatic.  Right Ear: Hearing, tympanic membrane, external ear and ear canal normal. No drainage, swelling or tenderness. No foreign bodies. Tympanic membrane is not erythematous and not bulging. No middle ear effusion. No decreased hearing is noted.  Left Ear: Hearing, tympanic membrane, external ear and ear canal normal. No drainage, swelling or tenderness. No foreign bodies. Tympanic membrane is not  erythematous and not bulging.  No middle ear effusion. No decreased hearing is noted.  Nose: Nose normal. No rhinorrhea. Right sinus exhibits no maxillary sinus tenderness and no frontal sinus tenderness. Left sinus exhibits no maxillary sinus tenderness and no frontal sinus tenderness.  Mouth/Throat: Uvula is midline, oropharynx is clear and moist and mucous membranes are normal. No oropharyngeal exudate, posterior oropharyngeal edema, posterior oropharyngeal erythema or tonsillar abscesses.  Eyes: Conjunctivae are normal.  Cardiovascular: Regular rhythm, normal heart sounds and normal pulses.   Pulmonary/Chest: Effort normal and breath sounds normal. She has no wheezes. She has no rhonchi. She has no rales.  Lymphadenopathy:       Head (right side): No submental, no submandibular, no tonsillar, no preauricular, no posterior auricular and no occipital adenopathy present.       Head (left side): No submental, no submandibular, no tonsillar, no preauricular, no posterior auricular and no occipital adenopathy present.    She has no cervical adenopathy.  Neurological: She is alert.  Skin: Skin is warm and dry.  Psychiatric: She has a normal mood and affect. Her speech is normal and behavior is normal. Thought content normal.  Vitals reviewed.      Assessment & Plan:   1. Bronchitis Afebrile and patient is well appearing. We discussed she recently had azithromycin and we are both reassured by some symptom improvement. We jointly agreed to trial short prednisone taper with albuterol inhaler for cough. Patient will maintain closely vigilant and let me know if this dose not help her symptoms. Also discussed using daily antihistamine, nasal saline washes, flonase to prevent recurrent sinus infections.   - predniSONE (DELTASONE) 10 MG tablet; Take 40 mg by mouth on day 1, then taper 10 mg daily until gone  Dispense: 10 tablet; Refill: 0 - albuterol (PROVENTIL HFA) 108 (90 Base) MCG/ACT inhaler; Inhale  2 puffs into the lungs every 6 (six) hours as needed for wheezing or shortness of breath.  Dispense: 1 Inhaler; Refill: 1    I am having Ms. Bartley start on predniSONE and albuterol. I am also having her maintain her rizatriptan, LO LOESTRIN FE, sertraline, and zonisamide.   Meds ordered this encounter  Medications  . predniSONE (DELTASONE) 10 MG tablet    Sig: Take 40 mg by mouth on day 1, then taper 10 mg daily until gone    Dispense:  10 tablet    Refill:  0    Order Specific Question:   Supervising Provider    Answer:   Duncan Dull L [[id number]]  . albuterol (PROVENTIL HFA) 108 (90 Base) MCG/ACT inhaler    Sig: Inhale 2 puffs into the lungs every 6 (six)  hours as needed for wheezing or shortness of breath.    Dispense:  1 Inhaler    Refill:  1    Order Specific Question:   Supervising Provider    Answer:   Sherlene ShamsULLO, TERESA L [[id number]]    Return precautions given.   Risks, benefits, and alternatives of the medications and treatment plan prescribed today were discussed, and patient expressed understanding.   Education regarding symptom management and diagnosis given to patient on AVS.  Continue to follow with Allegra GranaArnett, Margaret G, FNP for routine health maintenance.   Alejandra ApleyLauren H Llerenas and I agreed with plan.   Rennie PlowmanMargaret Arnett, FNP

## 2017-02-05 ENCOUNTER — Encounter: Payer: Self-pay | Admitting: Family

## 2017-04-26 ENCOUNTER — Ambulatory Visit (INDEPENDENT_AMBULATORY_CARE_PROVIDER_SITE_OTHER): Payer: BC Managed Care – PPO | Admitting: Family Medicine

## 2017-04-26 ENCOUNTER — Encounter: Payer: Self-pay | Admitting: Family Medicine

## 2017-04-26 VITALS — BP 130/76 | HR 115 | Temp 98.4°F | Wt 125.8 lb

## 2017-04-26 DIAGNOSIS — R519 Headache, unspecified: Secondary | ICD-10-CM

## 2017-04-26 DIAGNOSIS — R51 Headache: Secondary | ICD-10-CM | POA: Diagnosis not present

## 2017-04-26 DIAGNOSIS — G43801 Other migraine, not intractable, with status migrainosus: Secondary | ICD-10-CM

## 2017-04-26 MED ORDER — PROMETHAZINE HCL 25 MG PO TABS: 25.0000 mg | ORAL_TABLET | Freq: Once | ORAL | Status: DC

## 2017-04-26 MED ORDER — KETOROLAC TROMETHAMINE 60 MG/2ML IM SOLN
60.0000 mg | Freq: Once | INTRAMUSCULAR | Status: AC
  Administered 2017-04-26: 60 mg via INTRAMUSCULAR

## 2017-04-26 MED ORDER — PROMETHAZINE HCL 25 MG/ML IJ SOLN
25.0000 mg | Freq: Once | INTRAMUSCULAR | Status: AC
  Administered 2017-04-26: 25 mg via INTRAMUSCULAR

## 2017-04-26 NOTE — Progress Notes (Signed)
Migraines for most of her life. Current HA going on for a few days.  She usually has relief with listed meds.  She has had a series of HA recently.  She used maxalt x2 yesterday w/o relief.  Vomited last night.  Photo/phonophobia.  No new neuro sx.  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  Still on baseline proph meds.  Forehead over the top of the head to the occiput, R>L.  This is typical for her severe HA.  No fevers, no chills.  She has zofran for nausea w/o relief.  She has a driver.  Phenergan and toradol usually help the patient in situations like this.  Meds, vitals, and allergies reviewed.   ROS: Per HPI unless specifically indicated in ROS section   GEN: nad, alert and oriented HEENT: mucous membranes moist NECK: supple w/o LA CV: rrr. PULM: ctab, no inc wob ABD: soft, +bs EXT: no edema CN 2-12 wnl B, S/S/DTR wnl x4

## 2017-04-26 NOTE — Patient Instructions (Signed)
Take care.  Glad to see you.  Check with the headache clinic next week.  Try to get some rest.

## 2017-04-28 NOTE — Assessment & Plan Note (Signed)
Dose of Phenergan and Toradol given IM. 25 mg Phenergan. 60 mg Toradol. Routine cautions given. She has a driver. Update [place] as needed. Routed to PCP as FYI. Okay for outpatient follow-up.

## 2017-05-07 ENCOUNTER — Telehealth: Payer: Self-pay | Admitting: Family

## 2017-05-07 ENCOUNTER — Ambulatory Visit: Payer: BC Managed Care – PPO | Admitting: Family

## 2017-05-07 DIAGNOSIS — Z0289 Encounter for other administrative examinations: Secondary | ICD-10-CM

## 2017-05-07 NOTE — Telephone Encounter (Signed)
FYI

## 2017-05-07 NOTE — Telephone Encounter (Signed)
FYI - Pt called and cancelled appt. Pt is going to urologist.

## 2017-05-13 ENCOUNTER — Ambulatory Visit: Payer: BC Managed Care – PPO | Admitting: Family

## 2017-05-13 ENCOUNTER — Encounter: Payer: Self-pay | Admitting: Family

## 2017-05-13 DIAGNOSIS — Z0289 Encounter for other administrative examinations: Secondary | ICD-10-CM

## 2017-06-07 ENCOUNTER — Ambulatory Visit: Payer: BC Managed Care – PPO | Admitting: Family Medicine

## 2017-06-24 IMAGING — CT CT ABD-PELV W/ CM
1 of 2 series · 15 of 32 positions shown, 19 images · IV contrast (iopamidol)
Comparison: 11/04/2011

CLINICAL DATA: RIGHT lower quadrant pain with nausea this
afternoon, tender to palpation, history of fibromyalgia, prior
surgery for migrated IUD

EXAM:
CT ABDOMEN AND PELVIS WITH CONTRAST
TECHNIQUE: Multidetector CT imaging of the abdomen and pelvis was performed
using the standard protocol following bolus administration of
intravenous contrast. Sagittal and coronal MPR images reconstructed
from axial data set.
CONTRAST:  100mL B1SC90-3UU IOPAMIDOL (B1SC90-3UU) INJECTION 61% IV.
Dilute oral contrast.

[Series 2: routine abd pel with · axial · 0.62mm/px · z∈[-1158,-738]mm · 15 of 92 slices shown, 19 images]
[im 4/92  soft-tissue]
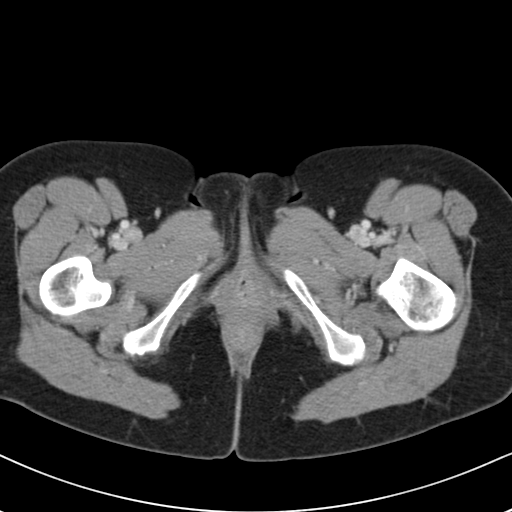
[im 4/92  bone]
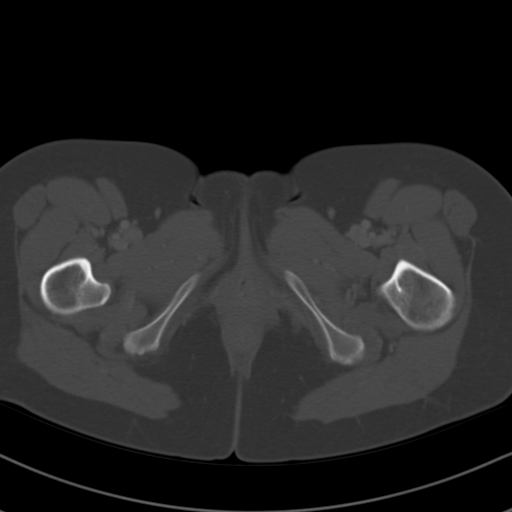
[im 12/92  soft-tissue]
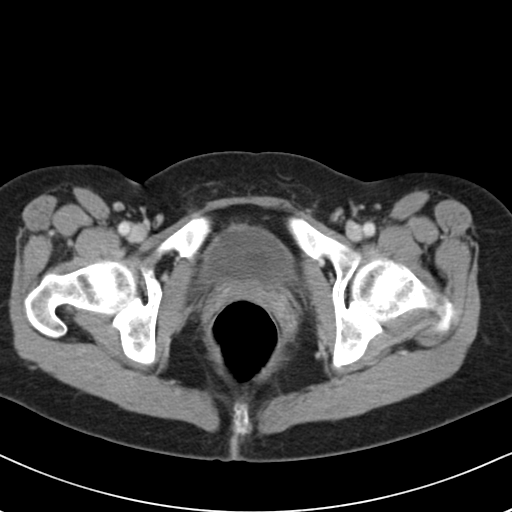
[im 19/92  soft-tissue]
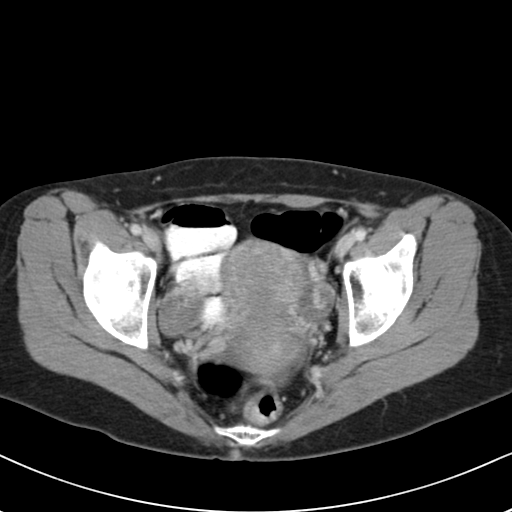
[im 27/92  soft-tissue]
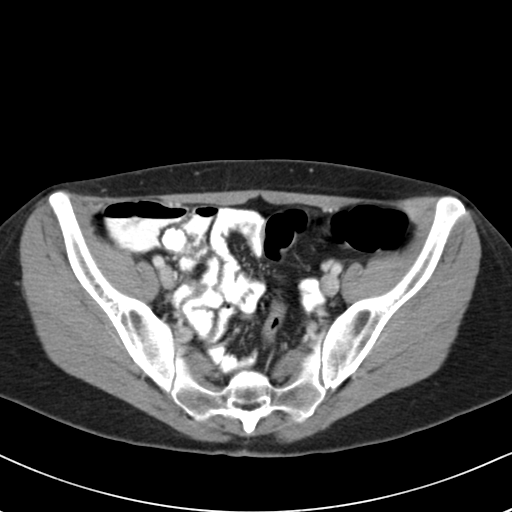
[im 31/92  soft-tissue]
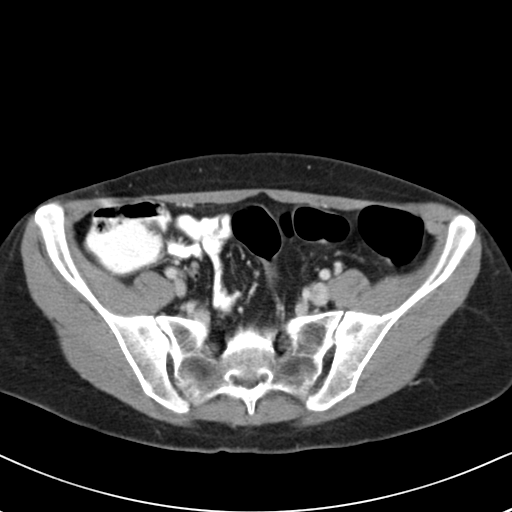
[im 38/92  soft-tissue]
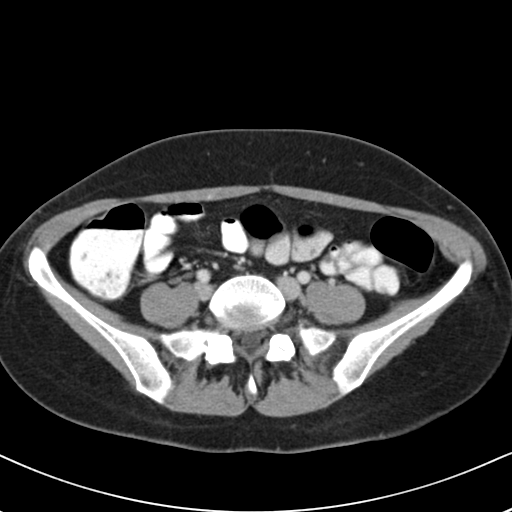
[im 46/92  soft-tissue]
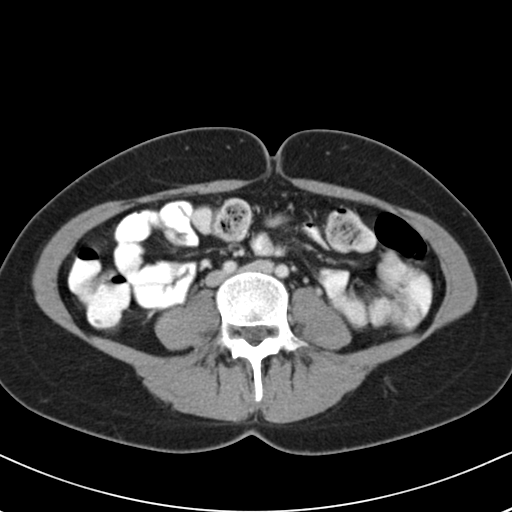
[im 54/92  soft-tissue]
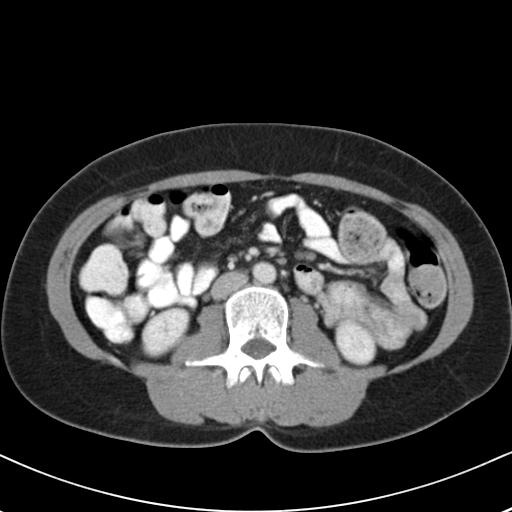
[im 61/92  soft-tissue]
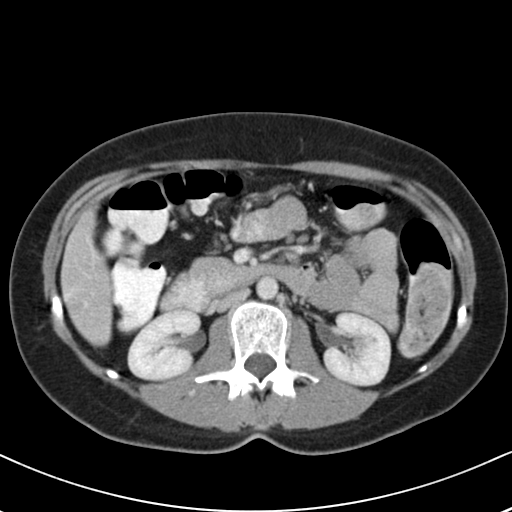
[im 61/92  bone]
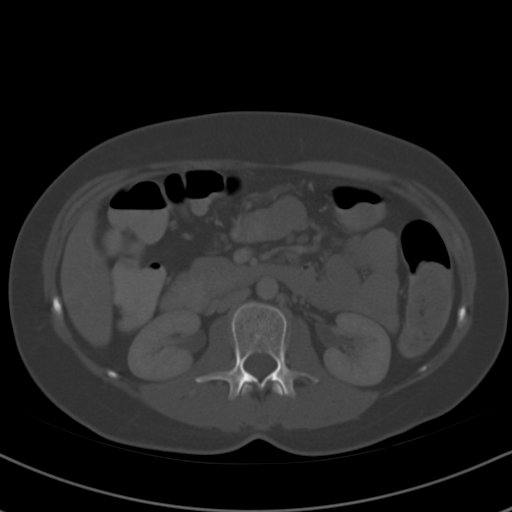
[im 65/92  soft-tissue]
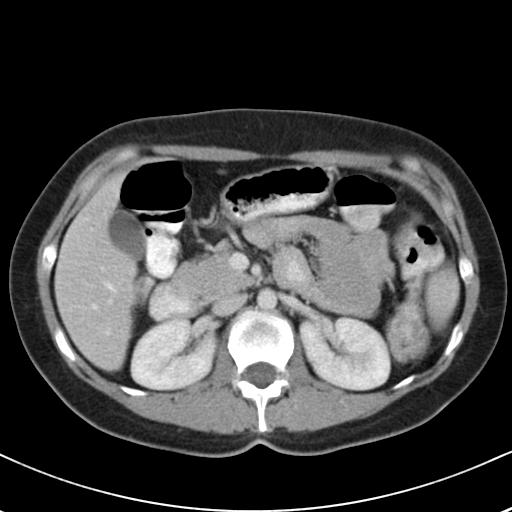
[im 73/92  soft-tissue]
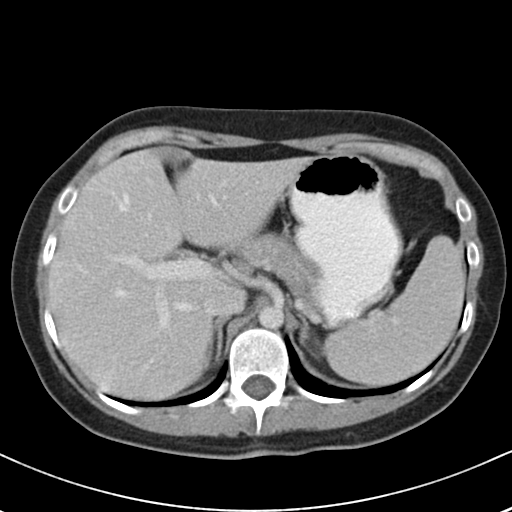
[im 76/92  lung]
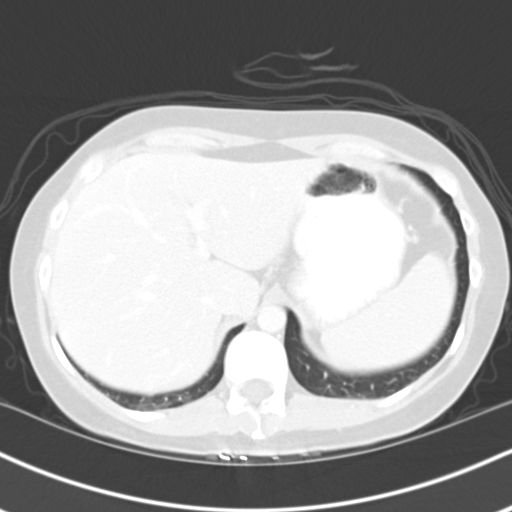
[im 80/92  soft-tissue]
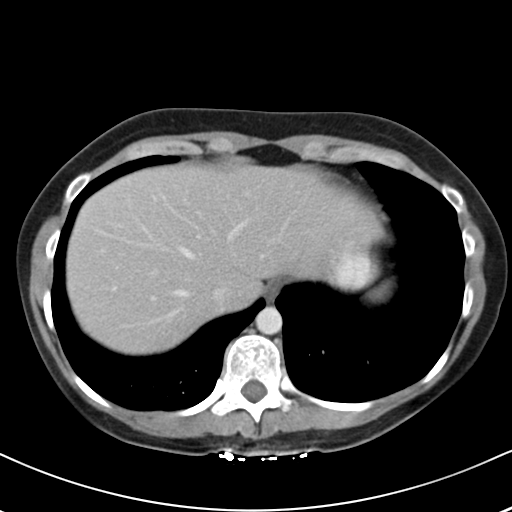
[im 80/92  lung]
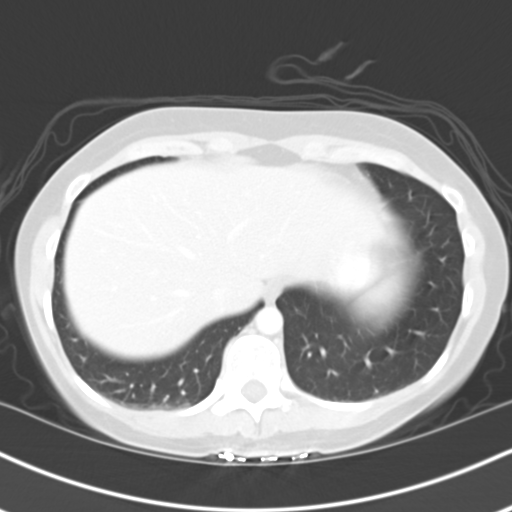
[im 84/92  lung]
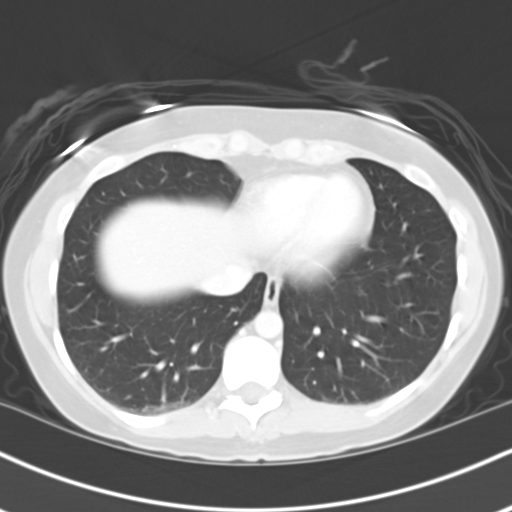
[im 88/92  soft-tissue]
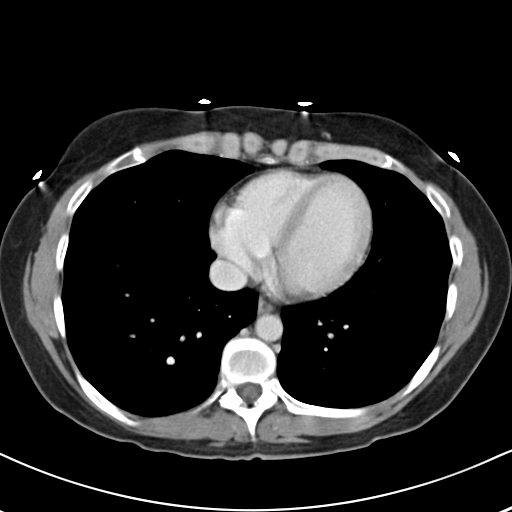
[im 88/92  lung]
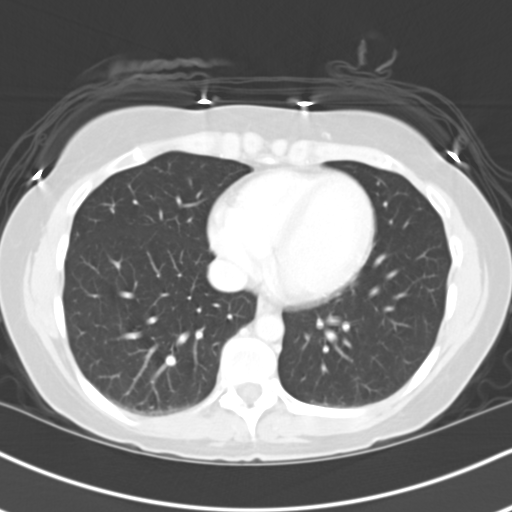

[15 of 32 positions shown; findings below may reference images not displayed]

FINDINGS: Minimal dependent atelectasis RIGHT lower lobe.

Liver, gallbladder, spleen, pancreas, kidneys, and adrenal glands
normal.

Small amount of nonspecific free pelvic fluid.

Normal appendix.

Small ring-enhancing partially collapsed cyst within both ovaries.

Bladder, ureters and uterus unremarkable.

Stomach and bowel loops normal appearance.

No mass, adenopathy, free air, free fluid, or hernia.

Bones unremarkable.
IMPRESSION: Probable small ruptured/collapsed cysts in both ovaries with minimal
free pelvic fluid.

Otherwise negative exam.

## 2017-06-25 NOTE — Progress Notes (Signed)
Office Visit Note  Patient: Alejandra Cook             Date of Birth: 1981-02-15           MRN: [id number]             PCP: Allegra GranaArnett, Margaret G, FNP Referring: No ref. provider found Visit Date: 07/04/2017 Occupation: @GUAROCC @    Subjective:  Fibromyalgia (a few frequent flares)   History of Present Illness: Alejandra Cook is a 36 y.o. female with history of fibromyalgia and this disease. She states she's been having increased pain and discomfort with the weather change. She describes pain and discomfort in the trapezius area and and bilateral trochanteric area. She denies any joint swelling.  Activities of Daily Living:  Patient reports morning stiffness for 5 minutes.   Patient Denies nocturnal pain.  Difficulty dressing/grooming: Denies Difficulty climbing stairs: Denies Difficulty getting out of chair: Denies Difficulty using hands for taps, buttons, cutlery, and/or writing: Denies   Review of Systems  Constitutional: Positive for fatigue. Negative for night sweats, weight gain, weight loss and weakness.  HENT: Positive for mouth dryness. Negative for mouth sores, trouble swallowing, trouble swallowing and nose dryness.   Eyes: Negative for pain, redness, visual disturbance and dryness.  Respiratory: Negative for cough, shortness of breath and difficulty breathing.   Cardiovascular: Negative for chest pain, palpitations, hypertension, irregular heartbeat and swelling in legs/feet.  Gastrointestinal: Negative for blood in stool, constipation and diarrhea.  Endocrine: Negative for increased urination.  Genitourinary: Negative for vaginal dryness.  Musculoskeletal: Positive for arthralgias, joint pain, myalgias, morning stiffness and myalgias. Negative for joint swelling, muscle weakness and muscle tenderness.  Skin: Negative for color change, rash, hair loss, skin tightness, ulcers and sensitivity to sunlight.  Allergic/Immunologic: Negative for susceptible to infections.    Neurological: Positive for headaches. Negative for dizziness, memory loss and night sweats.  Hematological: Negative for swollen glands.  Psychiatric/Behavioral: Positive for depressed mood and sleep disturbance. The patient is nervous/anxious.     PMFS History:  Patient Active Problem List   Diagnosis Date Noted  . History of migraine 12/20/2016  . Other fatigue 12/20/2016  . Primary insomnia 12/20/2016  . History of IBS 12/20/2016  . History of renal calculi 12/20/2016  . History of sinusitis 12/20/2016  . Bug bites 01/23/2016  . Routine general medical examination at a health care facility 01/21/2013  . Fibromyalgia 01/17/2012  . Allergic rhinitis 01/17/2012  . Migraine headache 05/12/2007  . DEGENERATIVE DISC DISEASE, LUMBAR SPINE 05/12/2007    Past Medical History:  Diagnosis Date  . Allergy    Dr. Gwen PoundsKowalski, gold and nickel allergy  . Anxiety   . Bulging discs   . Degenerative disc disease, lumbar   . Depression    off of zoloft for 1 year, had problems with depression and anxiety after last child  . Fibromyalgia    after pregnancy, Followed by [facility]  . Fibromyalgia   . WGNFAOZH(086.5Headache(784.0)    [facility]  . IBS (irritable bowel syndrome)   . 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 Spontaneous abortion in second trimester Dec 2014    Family History  Problem Relation Age of Onset  . Healthy Mother   . Hypertension Father   . Cancer Maternal Grandmother        cancer, breast  . Cancer Paternal Grandmother        breast  . Healthy Sister   .  Hypertension Paternal Grandfather   . Healthy Daughter   . Healthy Son    Past Surgical History:  Procedure Laterality Date  . COLONOSCOPY     normal  . COLPOSCOPY  2002  . CYSTOSCOPY     ureteral stent inserted  . DILATION AND EVACUATION N/A 08/07/2013   Procedure: DILATATION AND EVACUATION;  Surgeon: Zelphia CairoGretchen Adkins, MD;  Location: [facility];  Service: Gynecology;   Laterality: N/A;  . LAPAROSCOPY N/A 03/20/2013   Procedure: LAPAROSCOPY OPERATIVE  IUD REMOVAL;  Surgeon: Zelphia CairoGretchen Adkins, MD;  Location: [facility];  Service: Gynecology;  Laterality: N/A;  Intraabdominal from [facility]  . NASAL SINUS SURGERY    . PILONIDAL CYST EXCISION    . VAGINAL DELIVERY  2011   Social History   Social History Narrative   [place] in [place]. 2YO daughter.      Works- 10[employer] 11[employer]; [employer]        Objective: Vital Signs: BP 114/62 (BP Location: Left Arm, Patient Position: Sitting, Cuff Size: Normal)   Pulse 60   Resp 14   Ht 5\' 3"  (1.6 m)   Wt 130 lb (59 kg)   BMI 23.03 kg/m    Physical Exam  Constitutional: She is oriented to person, place, and time. She appears well-developed and well-nourished.  HENT:  Head: Normocephalic and atraumatic.  Eyes: Conjunctivae and EOM are normal.  Neck: Normal range of motion.  Cardiovascular: Normal rate, regular rhythm, normal heart sounds and intact distal pulses.  Pulmonary/Chest: Effort normal and breath sounds normal.  Abdominal: Soft. Bowel sounds are normal.  Lymphadenopathy:    She has no cervical adenopathy.  Neurological: She is alert and oriented to person, place, and time.  Skin: Skin is warm and dry. Capillary refill takes less than 2 seconds.  Psychiatric: She has a normal mood and affect. Her behavior is normal.  Nursing note and vitals reviewed.    Musculoskeletal Exam: C-spine and thoracic lumbar spine good range of motion. She had bilateral trapezius spasm. Shoulder joints elbow joints wrist joint MCPs PIPs DIPs are good range of motion with no synovitis. She is some tenderness over bilateral lateral epicondyle area. Hip joints knee joints ankles MTPs PIPs DIPs with good range of motion. She has bilateral trochanteric bursa tenderness consistent with trochanteric bursitis.  CDAI Exam: No CDAI exam completed.    Investigation: No additional findings.   Imaging: No results found.  Speciality  Comments: No specialty comments available.    Procedures:  Trigger Point Inj Date/Time: 07/04/2017 4:28 PM Performed by: Pollyann Savoyeveshwar, Clemence Stillings, MD Authorized by: Pollyann Savoyeveshwar, Stclair Szymborski, MD   Consent Given by:  Patient Site marked: the procedure site was marked   Timeout: prior to procedure the correct patient, procedure, and site was verified   Indications:  Muscle spasm and pain Total # of Trigger Points:  2 Location: neck   Needle Size:  27 G Approach:  Dorsal Medications #1:  0.5 mL lidocaine 1 % Medications #2:  40 mg triamcinolone acetonide 40 MG/ML Patient tolerance:  Patient tolerated the procedure well with no immediate complications    Allergies: Patient has no known allergies.   Assessment / Plan:     Visit Diagnoses: Fibromyalgia: Patient has generalized pain and discomfort and positive tender points. She's having a flare with cold weather.  Other fatigue: Related to insomnia.  Primary insomnia: Good sleep hygiene discussed.  Neck pain: She has bilateral trapezius is spasm. Different treatment options and side effects were discussed. We decided to proceed with  bilateral trapezius injection. The procedures described above. She was referred to physical therapy as well.  Trochanteric bursitis bilateral: I've referred her to physical therapy for evaluation and treatment.  Lateral epicondylitis of both elbows: She is some discomfort.  DDD (degenerative disc disease), lumbar: She has intermittent pain.  Other medical problems are listed as follows:  History of IBS  History of sinusitis  History of renal calculi  History of migraine    Orders: Orders Placed This Encounter  Procedures  . Trigger Point Inj   No orders of the defined types were placed in this encounter.   Face-to-face time spent with patient was 30 minutes. Greater than 50% of time was spent in counseling and coordination of care.  Follow-Up Instructions: Return in about 6 months (around  01/01/2018) for FMS DDD.   Pollyann Savoy, MD  Note - This record has been created using Animal nutritionist.  Chart creation errors have been sought, but may not always  have been located. Such creation errors do not reflect on  the standard of medical care.

## 2017-07-04 ENCOUNTER — Ambulatory Visit: Payer: BC Managed Care – PPO | Admitting: Rheumatology

## 2017-07-04 ENCOUNTER — Encounter: Payer: Self-pay | Admitting: Rheumatology

## 2017-07-04 VITALS — BP 114/62 | HR 60 | Resp 14 | Ht 63.0 in | Wt 130.0 lb

## 2017-07-04 DIAGNOSIS — Z87442 Personal history of urinary calculi: Secondary | ICD-10-CM

## 2017-07-04 DIAGNOSIS — R5383 Other fatigue: Secondary | ICD-10-CM

## 2017-07-04 DIAGNOSIS — M542 Cervicalgia: Secondary | ICD-10-CM

## 2017-07-04 DIAGNOSIS — M797 Fibromyalgia: Secondary | ICD-10-CM

## 2017-07-04 DIAGNOSIS — M7061 Trochanteric bursitis, right hip: Secondary | ICD-10-CM

## 2017-07-04 DIAGNOSIS — Z8719 Personal history of other diseases of the digestive system: Secondary | ICD-10-CM | POA: Diagnosis not present

## 2017-07-04 DIAGNOSIS — Z8669 Personal history of other diseases of the nervous system and sense organs: Secondary | ICD-10-CM | POA: Diagnosis not present

## 2017-07-04 DIAGNOSIS — F5101 Primary insomnia: Secondary | ICD-10-CM | POA: Diagnosis not present

## 2017-07-04 DIAGNOSIS — Z8709 Personal history of other diseases of the respiratory system: Secondary | ICD-10-CM

## 2017-07-04 DIAGNOSIS — M7711 Lateral epicondylitis, right elbow: Secondary | ICD-10-CM | POA: Diagnosis not present

## 2017-07-04 DIAGNOSIS — M5136 Other intervertebral disc degeneration, lumbar region: Secondary | ICD-10-CM | POA: Diagnosis not present

## 2017-07-04 DIAGNOSIS — M7712 Lateral epicondylitis, left elbow: Secondary | ICD-10-CM

## 2017-07-04 DIAGNOSIS — M7062 Trochanteric bursitis, left hip: Secondary | ICD-10-CM

## 2017-07-04 MED ORDER — LIDOCAINE HCL 1 % IJ SOLN
0.5000 mL | INTRAMUSCULAR | Status: AC | PRN
  Administered 2017-07-04: .5 mL

## 2017-07-04 MED ORDER — TRIAMCINOLONE ACETONIDE 40 MG/ML IJ SUSP
40.0000 mg | INTRAMUSCULAR | Status: AC | PRN
  Administered 2017-07-04: 40 mg via INTRAMUSCULAR

## 2017-07-25 ENCOUNTER — Encounter: Payer: Self-pay | Admitting: Internal Medicine

## 2017-07-25 ENCOUNTER — Ambulatory Visit: Payer: BC Managed Care – PPO | Admitting: Internal Medicine

## 2017-07-25 VITALS — BP 108/68 | HR 87 | Temp 97.9°F | Resp 18 | Ht 63.0 in | Wt 125.5 lb

## 2017-07-25 DIAGNOSIS — G43009 Migraine without aura, not intractable, without status migrainosus: Secondary | ICD-10-CM

## 2017-07-25 DIAGNOSIS — Z1329 Encounter for screening for other suspected endocrine disorder: Secondary | ICD-10-CM | POA: Diagnosis not present

## 2017-07-25 DIAGNOSIS — R112 Nausea with vomiting, unspecified: Secondary | ICD-10-CM | POA: Diagnosis not present

## 2017-07-25 DIAGNOSIS — Z1159 Encounter for screening for other viral diseases: Secondary | ICD-10-CM | POA: Diagnosis not present

## 2017-07-25 DIAGNOSIS — Z13220 Encounter for screening for lipoid disorders: Secondary | ICD-10-CM

## 2017-07-25 DIAGNOSIS — H5702 Anisocoria: Secondary | ICD-10-CM | POA: Diagnosis not present

## 2017-07-25 MED ORDER — PROMETHAZINE HCL 12.5 MG PO TABS: 12.5000 mg | ORAL_TABLET | Freq: Every evening | ORAL | 0 refills | Status: DC | PRN

## 2017-07-25 MED ORDER — ONDANSETRON HCL 4 MG PO TABS: 4.0000 mg | ORAL_TABLET | Freq: Every day | ORAL | 0 refills | Status: DC | PRN

## 2017-07-25 MED ORDER — RIZATRIPTAN BENZOATE 10 MG PO TABS: 10.0000 mg | ORAL_TABLET | ORAL | 1 refills | Status: AC | PRN

## 2017-07-25 NOTE — Progress Notes (Signed)
Pre-visit discussion using our clinic review tool. No additional management support is needed unless otherwise documented below in the visit note.  

## 2017-07-25 NOTE — Progress Notes (Signed)
Chief Complaint  Patient presents with  . Headache    Migraines is out of one of her medications cannot see neurologist he is out of town,   F/u  1. 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 With n/v took phenerghan she has left over h/a right eye and frontal and goes to left temple 6/10. She has adequate sleep, no caffeine.h/a was associated with n/v last night. She has had h/as since elementary school.  Botox with neurology Dr. Neale BurlyFreeman is not helping. She is noticing freq of h/a increasing. She reports she has had imaging with Dr. Neale BurlyFreeman in the past      Review of Systems  Constitutional: Negative for weight loss.  Gastrointestinal: Positive for nausea and vomiting.  Skin: Negative for rash.  Neurological: Positive for headaches.   Past Medical History:  Diagnosis Date  . Allergy    Dr. Gwen PoundsKowalski, gold and nickel allergy  . Anxiety   . Bulging discs   . Degenerative disc disease, lumbar   . Depression    off of zoloft for 1 year, had problems with depression and anxiety after last child  . Fibromyalgia    after pregnancy, Followed by [facility]  . Fibromyalgia   . UJWJXBJY(782.9Headache(784.0)    Dr. [facility]eman at [facility]; migraines since elementary   . IBS (irritable bowel syndrome)   . 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 Spontaneous abortion in second trimester Dec 2014   Past Surgical History:  Procedure Laterality Date  . COLONOSCOPY     normal  . COLPOSCOPY  2002  . CYSTOSCOPY     ureteral stent inserted  . DILATION AND EVACUATION N/A 08/07/2013   Procedure: DILATATION AND EVACUATION;  Surgeon: Zelphia CairoGretchen Adkins, MD;  Location: [facility];  Service: Gynecology;  Laterality: N/A;  . LAPAROSCOPY N/A 03/20/2013   Procedure: LAPAROSCOPY OPERATIVE  IUD REMOVAL;  Surgeon: Zelphia CairoGretchen Adkins, MD;  Location: [facility];  Service: Gynecology;  Laterality: N/A;  Intraabdominal from [facility]  . NASAL SINUS SURGERY    . PILONIDAL CYST EXCISION    . VAGINAL  DELIVERY  2011   Family History  Problem Relation Age of Onset  . Healthy Mother   . Hypertension Father   . Cancer Maternal Grandmother        cancer, breast  . Cancer Paternal Grandmother        breast  . Healthy Sister   . Hypertension Paternal Grandfather   . Healthy Daughter   . Healthy Son    Social History   Socioeconomic History  . Marital status: Married    Spouse name: Not on file  . Number of children: Not on file  . Years of education: Not on file  . Highest education level: Not on file  Social Needs  . Financial resource strain: Not on file  . Food insecurity - worry: Not on file  . Food insecurity - inability: Not on file  . Transportation needs - medical: Not on file  . Transportation needs - non-medical: Not on file  Occupational History  . Not on file  Tobacco Use  . Smoking status: Never Smoker  . Smokeless tobacco: Never Used  Substance and Sexual Activity  . Alcohol use: No    Alcohol/week: 0.0 oz  . Drug use: No  . Sexual activity: Yes  Other Topics Concern  . Not on file  Social History Narrative   [place] in [place] 2YO [place].      Works-  10[employer] 11[employer]; [employer]      Current Meds  Medication Sig  . albuterol (PROVENTIL HFA) 108 (90 Base) MCG/ACT inhaler Inhale 2 puffs into the lungs every 6 (six) hours as needed for wheezing or shortness of breath.  . LO LOESTRIN FE 1 MG-10 MCG / 10 MCG tablet Take 1 tablet by mouth daily. as directed  . rizatriptan (MAXALT) 10 MG tablet Take 1 tablet (10 mg total) by mouth as needed for migraine. May repeat in 2 hours if needed. Max dose 3 pills in 24 hours  . sertraline (ZOLOFT) 100 MG tablet Take 100 mg by mouth daily.  Marland Kitchen. zonisamide (ZONEGRAN) 100 MG capsule TAKE 3 CAPSULES BY ORAL ROUTE ONCE A DAY (AT BEDTIME) FOR 30 DAYS  . [DISCONTINUED] rizatriptan (MAXALT) 10 MG tablet TAKE 1 TABLET AS NEEDED FOR MIGRAINE, MAY REPEAT ONCE IN 2 HOURS   No Known Allergies No results found for this or any previous  visit (from the past 2160 hour(s)). Objective  Body mass index is 22.23 kg/m. Wt Readings from Last 3 Encounters:  07/25/17 125 lb 8 oz (56.9 kg)  07/04/17 130 lb (59 kg)  04/26/17 125 lb 12 oz (57 kg)   Temp Readings from Last 3 Encounters:  07/25/17 97.9 F (36.6 C) (Oral)  04/26/17 98.4 F (36.9 C) (Oral)  01/17/17 98.5 F (36.9 C) (Oral)   BP Readings from Last 3 Encounters:  07/25/17 108/68  07/04/17 114/62  04/26/17 130/76   Pulse Readings from Last 3 Encounters:  07/25/17 87  07/04/17 60  04/26/17 (!) 115   O2 room air 98%  Physical Exam  Constitutional: She is oriented to person, place, and time and well-developed, well-nourished, and in no distress. Vital signs are normal.  HENT:  Head: Normocephalic and atraumatic.  Mouth/Throat: Oropharynx is clear and moist and mucous membranes are normal.  Eyes: Conjunctivae are normal. Pupils are equal, round, and reactive to light.  Right pupil larger than left pt does not remember this  Cardiovascular: Normal rate, regular rhythm and normal heart sounds.  Pulmonary/Chest: Effort normal and breath sounds normal.  Neurological: She is alert and oriented to person, place, and time. Gait normal. Gait normal.  CN 2-12 grossly intact moving all 5 ext no deficits  Right pupil larger than left   Skin: Skin is warm and dry.  Psychiatric: Mood, memory, affect and judgment normal.  Nursing note and vitals reviewed.   Assessment   1. 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 botox not helping  2. N/v 3. Right pupil larger than left  4. HM Plan  1.  Refilled meds  F/u Dr. Neale BurlyFreeman  She has had imaging in the past with Neurology unable to see  If h/a's continue and not been recent consider MRI brain   2. zofran during day and phenergan qhs  3. Referral [facility] for eye exam  4. Had flu shot and Tdap  Disc HPV vaccine today  [facility] had 04/25/17  Labs fasting in 2 weeks.   Provider: Dr. French Anaracy  [facility]

## 2017-07-25 NOTE — Patient Instructions (Signed)
Refer to [facility]  Come in for labs w/in the last 2 weeks fasting  Think about HPV vaccine  Happy Holidays  Migraine Headache A migraine headache is a very strong throbbing pain on one side or both sides of your head. Migraines can also cause other symptoms. Talk with your doctor about what things may bring on (trigger) your migraine headaches. Follow these instructions at home: Medicines  Take over-the-counter and prescription medicines only as told by your doctor.  Do not drive or use heavy machinery while taking prescription pain medicine.  To prevent or treat constipation while you are taking prescription pain medicine, your doctor may recommend that you: ? Drink enough fluid to keep your pee (urine) clear or pale yellow. ? Take over-the-counter or prescription medicines. ? Eat foods that are high in fiber. These include fresh fruits and vegetables, whole grains, and beans. ? Limit foods that are high in fat and processed sugars. These include fried and sweet foods. Lifestyle  Avoid alcohol.  Do not use any products that contain nicotine or tobacco, such as cigarettes and e-cigarettes. If you need help quitting, ask your doctor.  Get at least 8 hours of sleep every night.  Limit your stress. General instructions   Keep a journal to find out what may bring on your migraines. For example, write down: ? What you eat and drink. ? How much sleep you get. ? Any change in what you eat or drink. ? Any change in your medicines.  If you have a migraine: ? Avoid things that make your symptoms worse, such as bright lights. ? It may help to lie down in a dark, quiet room. ? Do not drive or use heavy machinery. ? Ask your doctor what activities are safe for you.  Keep all follow-up visits as told by your doctor. This is important. Contact a doctor if:  You get a migraine that is different or worse than your usual migraines. Get help right away if:  Your migraine gets very  bad.  You have a fever.  You have a stiff neck.  You have trouble seeing.  Your muscles feel weak or like you cannot control them.  You start to lose your balance a lot.  You start to have trouble walking.  You pass out (faint). This information is not intended to replace advice given to you by your health care provider. Make sure you discuss any questions you have with your health care provider. Document Released: 05/01/2008 Document Revised: 02/10/2016 Document Reviewed: 01/09/2016 Elsevier Interactive Patient Education  2018 [employer]

## 2017-07-31 ENCOUNTER — Other Ambulatory Visit (INDEPENDENT_AMBULATORY_CARE_PROVIDER_SITE_OTHER): Payer: BC Managed Care – PPO

## 2017-07-31 DIAGNOSIS — Z1159 Encounter for screening for other viral diseases: Secondary | ICD-10-CM

## 2017-07-31 DIAGNOSIS — Z13220 Encounter for screening for lipoid disorders: Secondary | ICD-10-CM

## 2017-07-31 DIAGNOSIS — G43009 Migraine without aura, not intractable, without status migrainosus: Secondary | ICD-10-CM | POA: Diagnosis not present

## 2017-07-31 DIAGNOSIS — Z1329 Encounter for screening for other suspected endocrine disorder: Secondary | ICD-10-CM | POA: Diagnosis not present

## 2017-07-31 DIAGNOSIS — R112 Nausea with vomiting, unspecified: Secondary | ICD-10-CM | POA: Diagnosis not present

## 2017-07-31 LAB — COMPREHENSIVE METABOLIC PANEL
ALK PHOS: 50 U/L (ref 39–117)
ALT: 10 U/L (ref 0–35)
AST: 15 U/L (ref 0–37)
Albumin: 4.6 g/dL (ref 3.5–5.2)
BILIRUBIN TOTAL: 0.6 mg/dL (ref 0.2–1.2)
BUN: 13 mg/dL (ref 6–23)
CO2: 22 mEq/L (ref 19–32)
CREATININE: 1.02 mg/dL (ref 0.40–1.20)
Calcium: 8.9 mg/dL (ref 8.4–10.5)
Chloride: 109 mEq/L (ref 96–112)
GFR: 65.01 mL/min (ref >60.00)
GLUCOSE: 88 mg/dL (ref 70–99)
Potassium: 4 mEq/L (ref 3.5–5.1)
SODIUM: 139 meq/L (ref 135–145)
TOTAL PROTEIN: 6.9 g/dL (ref 6.0–8.3)

## 2017-07-31 LAB — URINALYSIS, ROUTINE W REFLEX MICROSCOPIC
Bilirubin Urine: NEGATIVE
KETONES UR: NEGATIVE
LEUKOCYTES UA: NEGATIVE
NITRITE: NEGATIVE
SPECIFIC GRAVITY, URINE: 1.025 (ref 1.000–1.030)
Total Protein, Urine: NEGATIVE
Urine Glucose: NEGATIVE
Urobilinogen, UA: 0.2 (ref 0.0–1.0)
pH: 5.5 (ref 5.0–8.0)

## 2017-07-31 LAB — CBC WITH DIFFERENTIAL/PLATELET
Basophils Absolute: 0.1 10*3/uL (ref 0.0–0.1)
Basophils Relative: 1 % (ref 0.0–3.0)
Eosinophils Absolute: 0.1 10*3/uL (ref 0.0–0.7)
Eosinophils Relative: 1.3 % (ref 0.0–5.0)
HCT: 45.1 % (ref 36.0–46.0)
Hemoglobin: 15.3 g/dL — ABNORMAL HIGH (ref 12.0–15.0)
LYMPHS ABS: 2.1 10*3/uL (ref 0.7–4.0)
Lymphocytes Relative: 34 % (ref 12.0–46.0)
MCHC: 33.8 g/dL (ref 30.0–36.0)
MCV: 97.2 fl (ref 78.0–100.0)
MONO ABS: 0.3 10*3/uL (ref 0.1–1.0)
Monocytes Relative: 5.4 % (ref 3.0–12.0)
NEUTROS PCT: 58.3 % (ref 43.0–77.0)
Neutro Abs: 3.6 10*3/uL (ref 1.4–7.7)
Platelets: 248 10*3/uL (ref 150.0–400.0)
RBC: 4.64 Mil/uL (ref 3.87–5.11)
RDW: 12.9 % (ref 11.5–15.5)
WBC: 6.2 10*3/uL (ref 4.0–10.5)

## 2017-07-31 LAB — LIPID PANEL
CHOL/HDL RATIO: 3
Cholesterol: 160 mg/dL (ref 0–200)
HDL: 53.2 mg/dL (ref >39.00)
LDL Cholesterol: 90 mg/dL (ref 0–99)
NonHDL: 106.61
TRIGLYCERIDES: 82 mg/dL (ref 0.0–149.0)
VLDL: 16.4 mg/dL (ref 0.0–40.0)

## 2017-07-31 LAB — TSH: TSH: 0.74 u[IU]/mL (ref 0.35–4.50)

## 2017-07-31 LAB — T4, FREE: FREE T4: 0.77 ng/dL (ref 0.60–1.60)

## 2017-08-01 ENCOUNTER — Encounter: Payer: Self-pay | Admitting: Internal Medicine

## 2017-08-01 LAB — HEPATITIS B SURFACE ANTIBODY, QUANTITATIVE: Hepatitis B-Post: 44 m[IU]/mL (ref >=10)

## 2017-08-02 ENCOUNTER — Other Ambulatory Visit: Payer: Self-pay | Admitting: Internal Medicine

## 2017-08-02 DIAGNOSIS — D751 Secondary polycythemia: Secondary | ICD-10-CM

## 2017-08-02 NOTE — Telephone Encounter (Signed)
Patient is having no UTI symptoms feels fine, have scheduled lab for in one month to recheck Hgb. Need orders.

## 2017-08-02 NOTE — Telephone Encounter (Signed)
Left message to return call, ok for [facility] to speak to patient Is patient having any UTI symptoms.  She can come back sooner if having UTI symptoms or schedule follow up in 1 month.  Otherwise route message back to Dr Shirlee LatchMclean in regards to migraines.

## 2017-08-29 ENCOUNTER — Other Ambulatory Visit: Payer: BC Managed Care – PPO

## 2017-09-03 ENCOUNTER — Telehealth (INDEPENDENT_AMBULATORY_CARE_PROVIDER_SITE_OTHER): Payer: Self-pay | Admitting: Radiology

## 2017-09-03 NOTE — Telephone Encounter (Signed)
Dr. D

## 2017-09-03 NOTE — Telephone Encounter (Signed)
Patient calling triage left voicemail on line requesting rx for medication. She states she cannot recall medication name. She states she has not needed anything for some time. She feels she needs something due to change in temperature she is having flare up for fibromyalgia. She states whatever she has had in the past has allowed her to manage pain so she is at least able to work. She did not leave pharmacy. Requesting a call back to advise. CB # [phone].

## 2017-09-04 MED ORDER — METHOCARBAMOL 500 MG PO TABS: 500.0000 mg | ORAL_TABLET | Freq: Every day | ORAL | 0 refills | Status: DC

## 2017-09-04 NOTE — Addendum Note (Signed)
Addended by: Henriette CombsHATTON, Laquana Villari L on: 09/04/2017 04:58 PM   Modules accepted: Orders

## 2017-09-04 NOTE — Telephone Encounter (Signed)
Prescription sent to the pharmacy and patient advised.  

## 2017-09-04 NOTE — Telephone Encounter (Signed)
Patient states she is having pain in her hands, elbows and hips. Especially since the temperatures have gotten colder. Patient states she believes she was given a muscle relaxer like robaxin in the past and would like to know if we can give that to her again. We treat patient for Fibromyalgia.

## 2017-09-04 NOTE — Telephone Encounter (Signed)
Robaxin 500 mg by mouth daily at bedtime when necessary total of 30 tablets with no refills.

## 2017-10-29 ENCOUNTER — Other Ambulatory Visit: Payer: Self-pay | Admitting: Neurology

## 2017-10-29 DIAGNOSIS — G43119 Migraine with aura, intractable, without status migrainosus: Secondary | ICD-10-CM

## 2017-10-30 ENCOUNTER — Other Ambulatory Visit: Payer: Self-pay | Admitting: Internal Medicine

## 2017-10-30 DIAGNOSIS — G43009 Migraine without aura, not intractable, without status migrainosus: Secondary | ICD-10-CM

## 2017-10-30 DIAGNOSIS — R112 Nausea with vomiting, unspecified: Secondary | ICD-10-CM

## 2017-10-30 MED ORDER — PROMETHAZINE HCL 12.5 MG PO TABS: 12.5000 mg | ORAL_TABLET | Freq: Once | ORAL | 2 refills | Status: AC | PRN

## 2017-11-01 ENCOUNTER — Ambulatory Visit: Payer: BC Managed Care – PPO

## 2017-11-06 ENCOUNTER — Ambulatory Visit: Payer: BC Managed Care – PPO

## 2017-11-25 ENCOUNTER — Telehealth: Payer: Self-pay | Admitting: Family

## 2017-11-25 ENCOUNTER — Encounter: Payer: Self-pay | Admitting: Family

## 2017-11-25 ENCOUNTER — Ambulatory Visit: Payer: BC Managed Care – PPO | Admitting: Family

## 2017-11-25 VITALS — BP 120/80 | HR 85 | Temp 98.8°F | Resp 15 | Wt 136.0 lb

## 2017-11-25 DIAGNOSIS — D582 Other hemoglobinopathies: Secondary | ICD-10-CM

## 2017-11-25 DIAGNOSIS — J40 Bronchitis, not specified as acute or chronic: Secondary | ICD-10-CM | POA: Diagnosis not present

## 2017-11-25 DIAGNOSIS — R112 Nausea with vomiting, unspecified: Secondary | ICD-10-CM | POA: Diagnosis not present

## 2017-11-25 DIAGNOSIS — G43009 Migraine without aura, not intractable, without status migrainosus: Secondary | ICD-10-CM | POA: Diagnosis not present

## 2017-11-25 LAB — CBC WITH DIFFERENTIAL/PLATELET
BASOS ABS: 0.1 10*3/uL (ref 0.0–0.1)
Basophils Relative: 0.9 % (ref 0.0–3.0)
EOS PCT: 2.6 % (ref 0.0–5.0)
Eosinophils Absolute: 0.2 10*3/uL (ref 0.0–0.7)
HEMATOCRIT: 44 % (ref 36.0–46.0)
Hemoglobin: 15.5 g/dL — ABNORMAL HIGH (ref 12.0–15.0)
LYMPHS ABS: 2.4 10*3/uL (ref 0.7–4.0)
Lymphocytes Relative: 32.9 % (ref 12.0–46.0)
MCHC: 35.2 g/dL (ref 30.0–36.0)
MCV: 93.9 fl (ref 78.0–100.0)
MONOS PCT: 5.5 % (ref 3.0–12.0)
Monocytes Absolute: 0.4 10*3/uL (ref 0.1–1.0)
NEUTROS ABS: 4.2 10*3/uL (ref 1.4–7.7)
NEUTROS PCT: 58.1 % (ref 43.0–77.0)
Platelets: 268 10*3/uL (ref 150.0–400.0)
RBC: 4.69 Mil/uL (ref 3.87–5.11)
RDW: 12.6 % (ref 11.5–15.5)
WBC: 7.2 10*3/uL (ref 4.0–10.5)

## 2017-11-25 MED ORDER — ONDANSETRON HCL 4 MG PO TABS: 4.0000 mg | ORAL_TABLET | Freq: Every day | ORAL | 0 refills | Status: DC | PRN

## 2017-11-25 MED ORDER — ALBUTEROL SULFATE HFA 108 (90 BASE) MCG/ACT IN AERS: 2.0000 | INHALATION_SPRAY | Freq: Four times a day (QID) | RESPIRATORY_TRACT | 1 refills | Status: DC | PRN

## 2017-11-25 MED ORDER — AZITHROMYCIN 250 MG PO TABS: ORAL_TABLET | ORAL | 0 refills | Status: DC

## 2017-11-25 NOTE — Patient Instructions (Addendum)
Labs today- CBC from Dr Shirlee Latch.  Use albuterol every 6 hours for first 24 hours to get good medication into the lungs and loosen congestion; after, you may use as needed and eventually stop all together when cough resolves.  Mucinex if congestion if thick. Plenty of water  I suspect that your infection is viral in nature.  As discussed, I advise that you wait to fill the antibiotic after 1-2 days of symptom management to see if your symptoms improve. If you do not show improvement, you may take the antibiotic as prescribed.   Acute Bronchitis, Adult Acute bronchitis is sudden (acute) swelling of the air tubes (bronchi) in the lungs. Acute bronchitis causes these tubes to fill with mucus, which can make it hard to breathe. It can also cause coughing or wheezing. In adults, acute bronchitis usually goes away within 2 weeks. A cough caused by bronchitis may last up to 3 weeks. Smoking, allergies, and asthma can make the condition worse. Repeated episodes of bronchitis may cause further lung problems, such as chronic obstructive pulmonary disease (COPD). What are the causes? This condition can be caused by germs and by substances that irritate the lungs, including:  Cold and flu viruses. This condition is most often caused by the same virus that causes a cold.  Bacteria.  Exposure to tobacco smoke, dust, fumes, and air pollution.  What increases the risk? This condition is more likely to develop in people who:  Have close contact with someone with acute bronchitis.  Are exposed to lung irritants, such as tobacco smoke, dust, fumes, and vapors.  Have a weak immune system.  Have a respiratory condition such as asthma.  What are the signs or symptoms? Symptoms of this condition include:  A cough.  Coughing up clear, yellow, or green mucus.  Wheezing.  Chest congestion.  Shortness of breath.  A fever.  Body aches.  Chills.  A sore throat.  How is this diagnosed? This  condition is usually diagnosed with a physical exam. During the exam, your health care provider may order tests, such as chest X-rays, to rule out other conditions. He or she may also:  Test a sample of your mucus for bacterial infection.  Check the level of oxygen in your blood. This is done to check for pneumonia.  Do a chest X-ray or lung function testing to rule out pneumonia and other conditions.  Perform blood tests.  Your health care provider will also ask about your symptoms and medical history. How is this treated? Most cases of acute bronchitis clear up over time without treatment. Your health care provider may recommend:  Drinking more fluids. Drinking more makes your mucus thinner, which may make it easier to breathe.  Taking a medicine for a fever or cough.  Taking an antibiotic medicine.  Using an inhaler to help improve shortness of breath and to control a cough.  Using a cool mist vaporizer or humidifier to make it easier to breathe.  Follow these instructions at home: Medicines  Take over-the-counter and prescription medicines only as told by your health care provider.  If you were prescribed an antibiotic, take it as told by your health care provider. Do not stop taking the antibiotic even if you start to feel better. General instructions  Get plenty of rest.  Drink enough fluids to keep your urine clear or pale yellow.  Avoid smoking and secondhand smoke. Exposure to cigarette smoke or irritating chemicals will make bronchitis worse. If you smoke and  you need help quitting, ask your health care provider. Quitting smoking will help your lungs heal faster.  Use an inhaler, cool mist vaporizer, or humidifier as told by your health care provider.  Keep all follow-up visits as told by your health care provider. This is important. How is this prevented? To lower your risk of getting this condition again:  Wash your hands often with soap and water. If soap and  water are not available, use hand sanitizer.  Avoid contact with people who have cold symptoms.  Try not to touch your hands to your mouth, nose, or eyes.  Make sure to get the flu shot every year.  Contact a health care provider if:  Your symptoms do not improve in 2 weeks of treatment. Get help right away if:  You cough up blood.  You have chest pain.  You have severe shortness of breath.  You become dehydrated.  You faint or keep feeling like you are going to faint.  You keep vomiting.  You have a severe headache.  Your fever or chills gets worse. This information is not intended to replace advice given to you by your health care provider. Make sure you discuss any questions you have with your health care provider. Document Released: 08/30/2004 Document Revised: 02/15/2016 Document Reviewed: 01/11/2016 Elsevier Interactive Patient Education  [employer]2018 [employer] Inc[employer]

## 2017-11-25 NOTE — Telephone Encounter (Signed)
Can I add on these labs?

## 2017-11-25 NOTE — Progress Notes (Signed)
Subjective:    Patient ID: Alejandra Cook, female    DOB: 1981/01/28, 37 y.o.   MRN: [id number]  CC: Alejandra Cook is a 37 y.o. female who presents today for follow up.   HPI:  Complain of cough, little thick congestion,  Had been on claritin and switched to allegra, no improvement. Allergies are bothering her. Wheezing. Has used an inhaler in the past. No fever, sore throat, sinus pain, ear pain.   HA- started CGRP -ajovy [place] for migraine. Much improved.   had [facility] Ne[facility]fre[facility] [place] [facility] [place]; established with dr Pecolia Adesshar 09/2017. On zoniasmide , gabapentin- consider weaning off of these Pending mri  Ophthalmology- has had eyes checked due to right pupil being larger. Wearing glasses now. States normal eye exam.   No lung disease. No smoker.     HISTORY:  Past Medical History:  Diagnosis Date  . Allergy    Dr. Gwen PoundsKowalski, gold and nickel allergy  . Anxiety   . Bulging discs   . Degenerative disc disease, lumbar   . Depression    off of zoloft for 1 year, had problems with depression and anxiety after last child  . Fibromyalgia    after pregnancy, Followed by [facility]  . Fibromyalgia   . QMVHQION(629.5Headache(784.0)    Dr. [facility]Freeman at [facility]; migraines since elementary   . IBS (irritable bowel syndrome)   . 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 Spontaneous abortion in second trimester Dec 2014   Past Surgical History:  Procedure Laterality Date  . COLONOSCOPY     normal  . COLPOSCOPY  2002  . CYSTOSCOPY     ureteral stent inserted  . DILATION AND EVACUATION N/A 08/07/2013   Procedure: DILATATION AND EVACUATION;  Surgeon: Zelphia CairoGretchen Adkins, MD;  Location: [facility];  Service: Gynecology;  Laterality: N/A;  . LAPAROSCOPY N/A 03/20/2013   Procedure: LAPAROSCOPY OPERATIVE  IUD REMOVAL;  Surgeon: Zelphia CairoGretchen Adkins, MD;  Location: [facility];  Service: Gynecology;  Laterality: N/A;  Intraabdominal from [place]  . NASAL SINUS SURGERY    .  PILONIDAL CYST EXCISION    . VAGINAL DELIVERY  2011   Family History  Problem Relation Age of Onset  . Healthy Mother   . Hypertension Father   . Cancer Maternal Grandmother        cancer, breast  . Cancer Paternal Grandmother        breast  . Healthy Sister   . Hypertension Paternal Grandfather   . Healthy Daughter   . Healthy Son     Allergies: Patient has no known allergies. Current Outpatient Medications on File Prior to Visit  Medication Sig Dispense Refill  . AJOVY 225 MG/1.5ML SOSY     . LO LOESTRIN FE 1 MG-10 MCG / 10 MCG tablet Take 1 tablet by mouth daily. as directed  9  . methocarbamol (ROBAXIN) 500 MG tablet Take 1 tablet (500 mg total) by mouth at bedtime. 30 tablet 0  . promethazine (PHENERGAN) 12.5 MG tablet Take 1-2 tablets (12.5-25 mg total) by mouth once as needed for up to 1 dose for nausea or vomiting. With migraines. 20 tablet 2  . rizatriptan (MAXALT) 10 MG tablet Take 1 tablet (10 mg total) by mouth as needed for migraine. May repeat in 2 hours if needed. Max dose 3 pills in 24 hours 10 tablet 1  . sertraline (ZOLOFT) 100 MG tablet Take 100 mg by mouth daily.  2  . zonisamide (ZONEGRAN) 100 MG capsule TAKE 3 CAPSULES BY ORAL ROUTE ONCE A DAY (AT BEDTIME) FOR 30 DAYS  2   No current facility-administered medications on file prior to visit.     Social History   Tobacco Use  . Smoking status: Never Smoker  . Smokeless tobacco: Never Used  Substance Use Topics  . Alcohol use: No    Alcohol/week: 0.0 oz  . Drug use: No    Review of Systems  Constitutional: Negative for chills and fever.  HENT: Positive for congestion. Negative for ear pain, sinus pain, sore throat and trouble swallowing.   Respiratory: Positive for cough and wheezing. Negative for shortness of breath.   Cardiovascular: Negative for chest pain and palpitations.  Gastrointestinal: Negative for nausea and vomiting.  Neurological: Positive for headaches (improved).      Objective:      BP 120/80 (BP Location: Left Arm, Patient Position: Sitting, Cuff Size: Normal)   Pulse 85   Temp 98.8 F (37.1 C) (Oral)   Resp 15   Wt 136 lb (61.7 kg)   SpO2 99%   BMI 24.09 kg/m  BP Readings from Last 3 Encounters:  11/25/17 120/80  07/25/17 108/68  07/04/17 114/62   Wt Readings from Last 3 Encounters:  11/25/17 136 lb (61.7 kg)  07/25/17 125 lb 8 oz (56.9 kg)  07/04/17 130 lb (59 kg)    Physical Exam  Constitutional: She appears well-developed and well-nourished.  HENT:  Head: Normocephalic and atraumatic.  Right Ear: Hearing, tympanic membrane, external ear and ear canal normal. No drainage, swelling or tenderness. No foreign bodies. Tympanic membrane is not erythematous and not bulging. No middle ear effusion. No decreased hearing is noted.  Left Ear: Hearing, tympanic membrane, external ear and ear canal normal. No drainage, swelling or tenderness. No foreign bodies. Tympanic membrane is not erythematous and not bulging.  No middle ear effusion. No decreased hearing is noted.  Nose: Nose normal. No rhinorrhea. Right sinus exhibits no maxillary sinus tenderness and no frontal sinus tenderness. Left sinus exhibits no maxillary sinus tenderness and no frontal sinus tenderness.  Mouth/Throat: Uvula is midline, oropharynx is clear and moist and mucous membranes are normal. No oropharyngeal exudate, posterior oropharyngeal edema, posterior oropharyngeal erythema or tonsillar abscesses.  Eyes: Conjunctivae are normal.  Right pupil larger  Cardiovascular: Regular rhythm, normal heart sounds and normal pulses.  Pulmonary/Chest: Effort normal and breath sounds normal. She has no wheezes. She has no rhonchi. She has no rales.  Lymphadenopathy:       Head (right side): No submental, no submandibular, no tonsillar, no preauricular, no posterior auricular and no occipital adenopathy present.       Head (left side): No submental, no submandibular, no tonsillar, no preauricular, no  posterior auricular and no occipital adenopathy present.    She has no cervical adenopathy.  Neurological: She is alert.  Skin: Skin is warm and dry.  Psychiatric: She has a normal mood and affect. Her speech is normal and behavior is normal. Thought content normal.  Vitals reviewed.      Assessment & Plan:   Problem List Items Addressed This Visit      Cardiovascular and Mediastinum   Migraine headache    Improved. Following with Dr Sherryll Burger. Pending mri. Will follow      Relevant Medications   AJOVY 225 MG/1.5ML SOSY   ondansetron (ZOFRAN) 4 MG tablet     Respiratory   Bronchitis - Primary    No  acute respiratory distress.  No adventitous lung sounds.  Reordered albuterol and advised her to use Mucinex to break up the congestion.  Also given her a prescription for azithromycin however advised her that Cook suspect infection is viral and Cook would wait 1-2 more days to see if improves with inhaler, Mucinex.  Patient will let me know how she is doing.      Relevant Medications   albuterol (PROVENTIL HFA) 108 (90 Base) MCG/ACT inhaler   azithromycin (ZITHROMAX) 250 MG tablet    Other Visit Diagnoses    Nausea and vomiting, intractability of vomiting not specified, unspecified vomiting type       Relevant Medications   ondansetron (ZOFRAN) 4 MG tablet       Cook am having Alejandra Cook start on azithromycin. Cook am also having her maintain her LO LOESTRIN FE, sertraline, zonisamide, rizatriptan, methocarbamol, promethazine, AJOVY, albuterol, and ondansetron.   Meds ordered this encounter  Medications  . albuterol (PROVENTIL HFA) 108 (90 Base) MCG/ACT inhaler    Sig: Inhale 2 puffs into the lungs every 6 (six) hours as needed for wheezing or shortness of breath.    Dispense:  1 Inhaler    Refill:  1  . ondansetron (ZOFRAN) 4 MG tablet    Sig: Take 1 tablet (4 mg total) by mouth daily as needed for nausea or vomiting.    Dispense:  20 tablet    Refill:  0    Generic ok  .  azithromycin (ZITHROMAX) 250 MG tablet    Sig: Tale 500 mg PO on day 1, then 250 mg PO q24h x 4 days.    Dispense:  6 tablet    Refill:  0    Order Specific Question:   Supervising Provider    Answer:   Sherlene Shams [[id number]]    Return precautions given.   Risks, benefits, and alternatives of the medications and treatment plan prescribed today were discussed, and patient expressed understanding.   Education regarding symptom management and diagnosis given to patient on AVS.  Continue to follow with Allegra Grana, FNP for routine health maintenance.   Alejandra Cook agreed with plan.   Rennie Plowman, FNP

## 2017-11-25 NOTE — Telephone Encounter (Signed)
Alejandra Cook said that she has already spoke to [name] about this.

## 2017-11-25 NOTE — Telephone Encounter (Signed)
Sent PCP staff message regarding add on labs. Labs unable to be added on due to no tubes being sent to [facility] or [facility].

## 2017-11-25 NOTE — Telephone Encounter (Signed)
-----   Message from Penne LashBrittany N Wiggins, [place] sent at 11/25/2017  5:09 PM EDT ----- Regarding: Add-on labs JAK2 [id number] Qual with reflex/Exon 12 and EPO labs can't be added on. Unfortunately no tubes were sent to either [facility] or [facility] so pt will have to come back in to be redrawn for those labs.   Thank you

## 2017-11-25 NOTE — Addendum Note (Signed)
Addended by: Alisia FerrariBARE, KRISTEN C on: 11/25/2017 02:16 PM   Modules accepted: Orders

## 2017-11-25 NOTE — Assessment & Plan Note (Signed)
No acute respiratory distress.  No adventitous lung sounds.  Reordered albuterol and advised her to use Mucinex to break up the congestion.  Also given her a prescription for azithromycin however advised her that I suspect infection is viral and I would wait 1-2 more days to see if improves with inhaler, Mucinex.  Patient will let me know how she is doing.

## 2017-11-25 NOTE — Assessment & Plan Note (Signed)
Improved. Following with Dr Sherryll BurgerShah. Pending mri. Will follow

## 2017-11-27 ENCOUNTER — Other Ambulatory Visit: Payer: Self-pay | Admitting: Family

## 2017-11-27 DIAGNOSIS — D582 Other hemoglobinopathies: Secondary | ICD-10-CM

## 2017-11-27 NOTE — Progress Notes (Signed)
close

## 2017-12-01 DIAGNOSIS — D751 Secondary polycythemia: Secondary | ICD-10-CM | POA: Insufficient documentation

## 2017-12-01 NOTE — Progress Notes (Signed)
[facility]  [phone]) [phone] [phone]) [phone]  ID: Alejandra Cook OB: 1980/12/05  MR#: [id number]  QIO#:[id number]  Patient Care Team: Allegra Grana, FNP as PCP - General (Family Medicine)  CHIEF COMPLAINT: Polycythemia.  INTERVAL HISTORY: Patient is a 37 year old female who was noted to have a slowly increasing hemoglobin on routine blood work.  She is anxious, but otherwise feels well.  She has no neurologic complaints.  She denies any recent fevers or illnesses.  She has good appetite and denies weight loss.  She has no chest pain or shortness of breath.  She denies any nausea, vomiting, constipation, or diarrhea.  She has no urinary complaints.  Patient otherwise feels well and offers no further specific complaints.  REVIEW OF SYSTEMS:   Review of Systems  Constitutional: Negative.  Negative for fever, malaise/fatigue and weight loss.  Respiratory: Negative.  Negative for cough, hemoptysis and shortness of breath.   Cardiovascular: Negative.  Negative for chest pain and leg swelling.  Gastrointestinal: Negative.  Negative for abdominal pain, blood in stool and melena.  Genitourinary: Negative.  Negative for dysuria.  Musculoskeletal: Negative.   Skin: Negative.  Negative for rash.  Neurological: Negative.  Negative for sensory change, focal weakness and weakness.  Psychiatric/Behavioral: The patient is nervous/anxious.     As per HPI. Otherwise, a complete review of systems is negative.  PAST MEDICAL HISTORY: Past Medical History:  Diagnosis Date  . Allergy    Dr. Gwen Pounds, gold and nickel allergy  . Anxiety   . Bulging discs   . Degenerative disc disease, lumbar   . Depression    off of zoloft for 1 year, had problems with depression and anxiety after last child  . Fibromyalgia    after pregnancy, Followed by [facility]  . Fibromyalgia   . LKGMWNUU(725.3)    Dr. Neale Burly at [facility]; migraines since elementary   . IBS  (irritable bowel syndrome)   . 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 Spontaneous abortion in second trimester Dec 2014    PAST SURGICAL HISTORY: Past Surgical History:  Procedure Laterality Date  . COLONOSCOPY     normal  . COLPOSCOPY  2002  . CYSTOSCOPY     ureteral stent inserted  . DILATION AND EVACUATION N/A 08/07/2013   Procedure: DILATATION AND EVACUATION;  Surgeon: Zelphia Cairo, MD;  Location: [facility];  Service: Gynecology;  Laterality: N/A;  . LAPAROSCOPY N/A 03/20/2013   Procedure: LAPAROSCOPY OPERATIVE  IUD REMOVAL;  Surgeon: Zelphia Cairo, MD;  Location: [facility];  Service: Gynecology;  Laterality: N/A;  Intraabdominal from [place]  . NASAL SINUS SURGERY    . PILONIDAL CYST EXCISION    . VAGINAL DELIVERY  2011    FAMILY HISTORY: Family History  Problem Relation Age of Onset  . Healthy Mother   . Hypertension Father   . Cancer Maternal Grandmother        cancer, breast  . Cancer Paternal Grandmother        breast  . Healthy Sister   . Hypertension Paternal Grandfather   . Healthy Daughter   . Healthy Son     ADVANCED DIRECTIVES (Y/N):  N  HEALTH MAINTENANCE: Social History   Tobacco Use  . Smoking status: Never Smoker  . Smokeless tobacco: Never Used  Substance Use Topics  . Alcohol use: No    Alcohol/week: 0.0 oz  . Drug use: No     Colonoscopy:  PAP:  Bone density:  Lipid panel:  No Known Allergies  Current Outpatient Medications  Medication Sig Dispense Refill  . AJOVY 225 MG/1.5ML SOSY     . gabapentin (NEURONTIN) 300 MG capsule Take 300 mg by mouth at bedtime.     . LO LOESTRIN FE 1 MG-10 MCG / 10 MCG tablet Take 1 tablet by mouth daily. as directed  9  . sertraline (ZOLOFT) 100 MG tablet Take 100 mg by mouth daily.  2  . zonisamide (ZONEGRAN) 100 MG capsule take 2 tabs at hs  2  . albuterol (PROVENTIL HFA) 108 (90 Base) MCG/ACT inhaler Inhale 2 puffs into the lungs every 6 (six) hours as needed for  wheezing or shortness of breath. (Patient not taking: Reported on 12/03/2017) 1 Inhaler 1  . methocarbamol (ROBAXIN) 500 MG tablet Take 1 tablet (500 mg total) by mouth at bedtime. (Patient not taking: Reported on 12/03/2017) 30 tablet 0  . ondansetron (ZOFRAN) 4 MG tablet Take 1 tablet (4 mg total) by mouth daily as needed for nausea or vomiting. (Patient not taking: Reported on 12/03/2017) 20 tablet 0  . promethazine (PHENERGAN) 12.5 MG tablet Take 1-2 tablets (12.5-25 mg total) by mouth once as needed for up to 1 dose for nausea or vomiting. With migraines. (Patient not taking: Reported on 12/03/2017) 20 tablet 2  . rizatriptan (MAXALT) 10 MG tablet Take 1 tablet (10 mg total) by mouth as needed for migraine. May repeat in 2 hours if needed. Max dose 3 pills in 24 hours (Patient not taking: Reported on 12/03/2017) 10 tablet 1   No current facility-administered medications for this visit.     OBJECTIVE: Vitals:   12/03/17 1341  BP: 129/86  Pulse: 89  Resp: 18  Temp: 97.9 F (36.6 C)     Body mass index is 21.96 kg/m.    ECOG FS:0 - Asymptomatic  General: Well-developed, well-nourished, no acute distress. Eyes: Pink conjunctiva, anicteric sclera. HEENT: Normocephalic, moist mucous membranes, clear oropharnyx. Lungs: Clear to auscultation bilaterally. Heart: Regular rate and rhythm. No rubs, murmurs, or gallops. Abdomen: Soft, nontender, nondistended. No organomegaly noted, normoactive bowel sounds. Musculoskeletal: No edema, cyanosis, or clubbing. Neuro: Alert, answering all questions appropriately. Cranial nerves grossly intact. Skin: No rashes or petechiae noted. Psych: Normal affect. Lymphatics: No cervical, calvicular, axillary or inguinal LAD.   LAB RESULTS:  Lab Results  Component Value Date   NA 139 07/31/2017   K 4.0 07/31/2017   CL 109 07/31/2017   CO2 22 07/31/2017   GLUCOSE 88 07/31/2017   BUN 13 07/31/2017   CREATININE 1.02 07/31/2017   CALCIUM 8.9 07/31/2017    PROT 6.9 07/31/2017   ALBUMIN 4.6 07/31/2017   AST 15 07/31/2017   ALT 10 07/31/2017   ALKPHOS 50 07/31/2017   BILITOT 0.6 07/31/2017   GFRNONAA >60 10/31/2015   GFRAA >60 10/31/2015    Lab Results  Component Value Date   WBC 8.1 12/03/2017   NEUTROABS 4.2 11/25/2017   HGB 15.1 12/03/2017   HCT 42.7 12/03/2017   MCV 93.9 12/03/2017   PLT 264 12/03/2017   Lab Results  Component Value Date   IRON 71 12/03/2017   TIBC 385 12/03/2017   IRONPCTSAT 18 12/03/2017   Lab Results  Component Value Date   FERRITIN 23 12/03/2017       STUDIES: No results found.  ASSESSMENT: Polycythemia  PLAN:    1. Polycythemia: Patient's hemoglobin is within normal limits today.  Her iron stores and erythropoietin level are also within normal  limits.  Hemochromatosis gene mutation and JAK-2 mutation are pending at time of dictation.  No intervention is needed at this time.  Patient does not require phlebotomy.  Return to clinic in 6 weeks with repeat laboratory work and further evaluation.  If the remainder of her work-up is negative and her hemoglobin continues to be within normal limits, she likely can be discharged from clinic.  Approximately 45 minutes was spent in discussion of which greater than 50% was consultation.  Patient expressed understanding and was in agreement with this plan. She also understands that She can call clinic at any time with any questions, concerns, or complaints.    Jeralyn Ruths, MD   12/04/2017 10:35 PM

## 2017-12-03 ENCOUNTER — Other Ambulatory Visit: Payer: Self-pay

## 2017-12-03 ENCOUNTER — Encounter: Payer: Self-pay | Admitting: Oncology

## 2017-12-03 ENCOUNTER — Inpatient Hospital Stay: Payer: BC Managed Care – PPO | Attending: Oncology | Admitting: Oncology

## 2017-12-03 ENCOUNTER — Inpatient Hospital Stay: Payer: BC Managed Care – PPO

## 2017-12-03 DIAGNOSIS — Z79899 Other long term (current) drug therapy: Secondary | ICD-10-CM | POA: Diagnosis not present

## 2017-12-03 DIAGNOSIS — D751 Secondary polycythemia: Secondary | ICD-10-CM

## 2017-12-03 DIAGNOSIS — M5136 Other intervertebral disc degeneration, lumbar region: Secondary | ICD-10-CM | POA: Diagnosis not present

## 2017-12-03 DIAGNOSIS — F418 Other specified anxiety disorders: Secondary | ICD-10-CM | POA: Diagnosis not present

## 2017-12-03 DIAGNOSIS — Z803 Family history of malignant neoplasm of breast: Secondary | ICD-10-CM | POA: Diagnosis not present

## 2017-12-03 DIAGNOSIS — K589 Irritable bowel syndrome without diarrhea: Secondary | ICD-10-CM | POA: Diagnosis not present

## 2017-12-03 DIAGNOSIS — M797 Fibromyalgia: Secondary | ICD-10-CM | POA: Diagnosis not present

## 2017-12-03 LAB — CBC
HEMATOCRIT: 42.7 % (ref 35.0–47.0)
HEMOGLOBIN: 15.1 g/dL (ref 12.0–16.0)
MCH: 33.2 pg (ref 26.0–34.0)
MCHC: 35.4 g/dL (ref 32.0–36.0)
MCV: 93.9 fL (ref 80.0–100.0)
Platelets: 264 10*3/uL (ref 150–440)
RBC: 4.55 MIL/uL (ref 3.80–5.20)
RDW: 12.4 % (ref 11.5–14.5)
WBC: 8.1 10*3/uL (ref 3.6–11.0)

## 2017-12-03 LAB — FERRITIN: Ferritin: 23 ng/mL (ref 11–307)

## 2017-12-03 LAB — IRON AND TIBC
IRON: 71 ug/dL (ref 28–170)
Saturation Ratios: 18 % (ref 10.4–31.8)
TIBC: 385 ug/dL (ref 250–450)
UIBC: 314 ug/dL

## 2017-12-03 NOTE — Progress Notes (Signed)
Here for new pt evaluation.  

## 2017-12-04 LAB — ERYTHROPOIETIN: Erythropoietin: 7.8 m[IU]/mL (ref 2.6–18.5)

## 2017-12-09 LAB — JAK2 GENOTYPR

## 2017-12-09 LAB — HEMOCHROMATOSIS DNA-PCR(C282Y,H63D)

## 2018-01-07 NOTE — Progress Notes (Signed)
Office Visit Note  Patient: Alejandra Cook             Date of Birth: 07-Jan-1981           MRN: [id number]             PCP: Allegra Grana, FNP Referring: Allegra Grana, FNP Visit Date: 01/21/2018 Occupation: @GUAROCC @    Subjective:  Bilateral trochanteric bursitis   History of Present Illness: Alejandra Cook is a 37 y.o. female with history of fibromyalgia and DDD.  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  She reports that she has generalized muscle aches muscle tenderness due to fiber myalgia.  Her most problematic areas in the trapezius region bilaterally.  She has previously seen a firefighter twice a week which is helping with her neck and shoulder pain.  She plans on going back to the chiropractor this summer.  She is also having tenderness of bilateral trochanteric bursa.  She reports that she does exercises on a regular basis which help with her symptoms.  She states she is continues to have bilateral lateral epicondylitis as well.  She is been performing exercises but has not tried wearing a brace.  She reports that her lower back is doing well.  She states that she is been having some discomfort in her bilateral hands but denies any joint swelling.  She states that she continues to have chronic insomnia and does not feel rested when she wakes up in the morning.  She states that her fatigue continues to be chronic as well.  She is been taking Robaxin as needed for muscle spasms.  She continues take gabapentin 300 mg at bedtime as well.  She denies needing any refills.  She perform stretching exercises on a daily basis as well as swimming for exercise.   Activities of Daily Living:  Patient reports morning stiffness for 0  minutes.   Patient Denies nocturnal pain.  Difficulty dressing/grooming: Denies Difficulty climbing stairs: Denies Difficulty getting out of chair: Denies Difficulty  using hands for taps, buttons, cutlery, and/or writing: Denies   Review of Systems  Constitutional: Positive for fatigue.  HENT: Negative for mouth sores, mouth dryness and nose dryness.   Eyes: Negative for pain, visual disturbance and dryness.  Respiratory: Negative for cough, hemoptysis, shortness of breath and difficulty breathing.   Cardiovascular: Negative for chest pain, palpitations, hypertension and swelling in legs/feet.  Gastrointestinal: Positive for constipation. Negative for blood in stool and diarrhea.  Endocrine: Negative for increased urination.  Genitourinary: Negative for painful urination.  Musculoskeletal: Positive for arthralgias, joint pain, myalgias, muscle tenderness and myalgias. Negative for joint swelling, muscle weakness and morning stiffness.  Skin: Negative for color change, pallor, rash, hair loss, nodules/bumps, skin tightness, ulcers and sensitivity to sunlight.  Allergic/Immunologic: Negative for susceptible to infections.  Neurological: Positive for headaches (hx of migraines). Negative for dizziness, numbness and weakness.  Hematological: Negative for swollen glands.  Psychiatric/Behavioral: Positive for depressed mood and sleep disturbance. The patient is nervous/anxious.     PMFS History:  Patient Active Problem List   Diagnosis Date Noted  . Polycythemia 12/01/2017  . History of migraine 12/20/2016  . Other fatigue 12/20/2016  . Primary insomnia 12/20/2016  . History of IBS 12/20/2016  . History of renal calculi 12/20/2016  . History of sinusitis 12/20/2016  . Bug bites 01/23/2016  . Bronchitis 08/11/2014  .  Routine general medical examination at a health care facility 01/21/2013  . Fibromyalgia 01/17/2012  . Allergic rhinitis 01/17/2012  . Migraine headache 05/12/2007  . DEGENERATIVE DISC DISEASE, LUMBAR SPINE 05/12/2007    Past Medical History:  Diagnosis Date  . Allergy    Dr. Gwen Pounds, gold and nickel allergy  . Anxiety   .  Bulging discs   . Degenerative disc disease, lumbar   . Depression    off of zoloft for 1 year, had problems with depression and anxiety after last child  . Fibromyalgia    after pregnancy, Followed by Devosh[facility]  . Fibromyalgia   . ZOXWRUEA(540.9)    Dr. Neale Burly at [facility]; migraines since elementary   . IBS (irritable bowel syndrome)   . 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 Spontaneous abortion in second trimester Dec 2014    Family History  Problem Relation Age of Onset  . Healthy Mother   . Hypertension Father   . Cancer Maternal Grandmother        cancer, breast  . Cancer Paternal Grandmother        breast  . Healthy Sister   . Hypertension Paternal Grandfather   . Healthy Daughter   . Healthy Son    Past Surgical History:  Procedure Laterality Date  . COLONOSCOPY     normal  . COLPOSCOPY  2002  . CYSTOSCOPY     ureteral stent inserted  . DILATION AND EVACUATION N/A 08/07/2013   Procedure: DILATATION AND EVACUATION;  Surgeon: Zelphia Cairo, MD;  Location: [facility];  Service: Gynecology;  Laterality: N/A;  . LAPAROSCOPY N/A 03/20/2013   Procedure: LAPAROSCOPY OPERATIVE  IUD REMOVAL;  Surgeon: Zelphia Cairo, MD;  Location: [facility];  Service: Gynecology;  Laterality: N/A;  Intraabdominal from [place]  . NASAL SINUS SURGERY    . PILONIDAL CYST EXCISION    . VAGINAL DELIVERY  2011   Social History   Social History Narrative   Lives in [place]. 2YO daughter.      Works- 10th and 11th; [employer]        Objective: Vital Signs: BP 108/77 (BP Location: Left Arm, Patient Position: Sitting, Cuff Size: Normal)   Pulse 83   Resp 13   Ht 5\' 3"  (1.6 m)   Wt 131 lb (59.4 kg)   BMI 23.21 kg/m    Physical Exam  Constitutional: She is oriented to person, place, and time. She appears well-developed and well-nourished.  HENT:  Head: Normocephalic and atraumatic.  Eyes: Conjunctivae and EOM are normal.  Neck: Normal range of  motion.  Cardiovascular: Normal rate, regular rhythm, normal heart sounds and intact distal pulses.  Pulmonary/Chest: Effort normal and breath sounds normal.  Abdominal: Soft. Bowel sounds are normal.  Lymphadenopathy:    She has no cervical adenopathy.  Neurological: She is alert and oriented to person, place, and time.  Skin: Skin is warm and dry. Capillary refill takes less than 2 seconds.  Psychiatric: She has a normal mood and affect. Her behavior is normal.  Nursing note and vitals reviewed.    Musculoskeletal Exam: C-spine, thoracic spine, lumbar spine good range of motion.  No midline spinal tenderness.  No SI joint tenderness.  Shoulder joints, elbow joints, wrist joints, MCPs, PIPs, DIPs good range of motion with no synovitis.  She has tenderness in the bilateral lateral epicondylar region.  Hip joints, knee joints, ankle joints, MTPs, PIPs, DIPs good range of motion with no synovitis.  No warmth  or effusion of bilateral knee joints.  No knee crepitus.  She has tenderness of bilateral trochanteric bursa.  CDAI Exam: No CDAI exam completed.    Investigation: No additional findings. CBC Latest Ref Rng & Units 12/03/2017 11/25/2017 07/31/2017  WBC 3.6 - 11.0 K/uL 8.1 7.2 6.2  Hemoglobin 12.0 - 16.0 g/dL 82.915.1 15.5(H) 15.3(H)  Hematocrit 35.0 - 47.0 % 42.7 44.0 45.1  Platelets 150 - 440 K/uL 264 268.0 248.0   CMP Latest Ref Rng & Units 07/31/2017 10/31/2015 01/09/2015  Glucose 70 - 99 mg/dL 88 85 89  BUN 6 - 23 mg/dL 13 15 9   Creatinine 0.40 - 1.20 mg/dL 5.621.02 1.300.74 8.650.58  Sodium 135 - 145 mEq/L 139 135 136  Potassium 3.5 - 5.1 mEq/L 4.0 3.2(L) 3.7  Chloride 96 - 112 mEq/L 109 109 108  CO2 19 - 32 mEq/L 22 19(L) 21(L)  Calcium 8.4 - 10.5 mg/dL 8.9 9.1 7.8(I8.8(L)  Total Protein 6.0 - 8.3 g/dL 6.9 7.8 5.7(L)  Total Bilirubin 0.2 - 1.2 mg/dL 0.6 0.3 0.4  Alkaline Phos 39 - 117 U/L 50 83 81  AST 0 - 37 U/L 15 22 22   ALT 0 - 35 U/L 10 12(L) 12(L)     Imaging: No results  found.  Speciality Comments: No specialty comments available.    Procedures:  No procedures performed Allergies: Patient has no known allergies.   Assessment / Plan:     Visit Diagnoses: Fibromyalgia: She continues to have generalized muscle aches muscle tenderness due to fibromyalgia.  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  She continues to have chronic insomnia and fatigue.  She reports that she does not wake up feeling rested.  Good sleep hygiene was discussed.  She was encouraged to continue to exercise and stretch on a regular basis.  She has been swimming for exercise on a regular basis.  She plans on going back to the chiropractor to help with her neck and shoulder pain.  She takes Robaxin as needed for muscle spasms as well as gabapentin 300 mg at bedtime which has been helping with her pain.  She does not need any refills at this time.  Other fatigue: Chronic and related to insomnia.  She is encouraged to continue to exercise and stay active.  Primary insomnia: Chronic.  She takes gabapentin 300 mg at bedtime which helps causes drowsiness.  Good sleep hygiene was discussed.  Lateral epicondylitis of both elbows: She continues to have tenderness of her bilateral epicondylar regions.  She performs exercises on a regular basis.  She was encouraged to try wearing a tennis elbow brace.  She declined a cortisone injection today in the office.  DDD (degenerative disc disease), lumbar: Doing well.  She has no discomfort at this time.  No midline spinal tenderness.  Good range of motion on exam.  Trochanteric bursitis of both hips: She has tenderness of bilateral trochanteric bursa on exam.  She performs stretching exercises on a daily basis.  She declined a cortisone injection today in the office.   Other medical conditions are listed as follows:  History of IBS  History of renal  calculi  History of migraine    Orders: No orders of the defined types were placed in this encounter.  No orders of the defined types were placed in this encounter.    Follow-Up Instructions: Return in about 6 months (around 07/23/2018) for Fibromyalgia, DDD.  Ofilia Neas, PA-C  Note - This record has been created using Dragon software.  Chart creation errors have been sought, but may not always  have been located. Such creation errors do not reflect on  the standard of medical care.

## 2018-01-20 ENCOUNTER — Other Ambulatory Visit: Payer: Self-pay | Admitting: Family

## 2018-01-20 DIAGNOSIS — G43009 Migraine without aura, not intractable, without status migrainosus: Secondary | ICD-10-CM

## 2018-01-20 DIAGNOSIS — R112 Nausea with vomiting, unspecified: Secondary | ICD-10-CM

## 2018-01-20 NOTE — Telephone Encounter (Signed)
Last fille d4/22/19 No office visit scheduled Last office visit 11/25/17

## 2018-01-20 NOTE — Progress Notes (Signed)
[facility]  [phone] [phone]  ID: Alejandra Cook OB: March 08, 1981  MR#: [id number]  AOZ#:[id number]  Patient Care Team: Allegra Grana, FNP as PCP - General (Family Medicine)  CHIEF COMPLAINT: Polycythemia, resolved.  INTERVAL HISTORY: Patient returns to clinic today for repeat laboratory work and further evaluation.  She currently feels well and is asymptomatic.  She has no neurologic complaints.  She denies any recent fevers or illnesses.  She has a good appetite and denies weight loss.  She has no chest pain or shortness of breath.  She denies any nausea, vomiting, constipation, or diarrhea.  She has no urinary complaints.  Patient feels at her baseline offers no specific complaints today. Without REVIEW OF SYSTEMS:   Review of Systems  Constitutional: Negative.  Negative for fever, malaise/fatigue and weight loss.  Respiratory: Negative.  Negative for cough, hemoptysis and shortness of breath.   Cardiovascular: Negative.  Negative for chest pain and leg swelling.  Gastrointestinal: Negative.  Negative for abdominal pain, blood in stool and melena.  Genitourinary: Negative.  Negative for dysuria.  Musculoskeletal: Negative.  Negative for back pain.  Skin: Negative.  Negative for rash.  Neurological: Negative.  Negative for sensory change, focal weakness and weakness.  Psychiatric/Behavioral: Negative.  The patient is not nervous/anxious.     As per HPI. Otherwise, a complete review of systems is negative.  PAST MEDICAL HISTORY: Past Medical History:  Diagnosis Date  . Allergy    Dr. Gwen Pounds, gold and nickel allergy  . Anxiety   . Bulging discs   . Degenerative disc disease, lumbar   . Depression    off of zoloft for 1 year, had problems with depression and anxiety after last child  . Fibromyalgia    after pregnancy, Followed by Devoshar  . Fibromyalgia   . NGEXBMWU(132.4)    Dr. Neale Burly at [facility]; migraines  since elementary   . IBS (irritable bowel syndrome)   . 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 Spontaneous abortion in second trimester Dec 2014    PAST SURGICAL HISTORY: Past Surgical History:  Procedure Laterality Date  . COLONOSCOPY     normal  . COLPOSCOPY  2002  . CYSTOSCOPY     ureteral stent inserted  . DILATION AND EVACUATION N/A 08/07/2013   Procedure: DILATATION AND EVACUATION;  Surgeon: Zelphia Cairo, MD;  Location: [facility];  Service: Gynecology;  Laterality: N/A;  . LAPAROSCOPY N/A 03/20/2013   Procedure: LAPAROSCOPY OPERATIVE  IUD REMOVAL;  Surgeon: Zelphia Cairo, MD;  Location: [facility];  Service: Gynecology;  Laterality: N/A;  Intraabdominal from [place]  . NASAL SINUS SURGERY    . PILONIDAL CYST EXCISION    . VAGINAL DELIVERY  2011    FAMILY HISTORY: Family History  Problem Relation Age of Onset  . Healthy Mother   . Hypertension Father   . Cancer Maternal Grandmother        cancer, breast  . Cancer Paternal Grandmother        breast  . Healthy Sister   . Hypertension Paternal Grandfather   . Healthy Daughter   . Healthy Son     ADVANCED DIRECTIVES (Y/N):  N  HEALTH MAINTENANCE: Social History   Tobacco Use  . Smoking status: Never Smoker  . Smokeless tobacco: Never Used  Substance Use Topics  . Alcohol use: No    Alcohol/week: 0.0 oz  . Drug use: Never     Colonoscopy:  PAP:  Bone density:  Lipid panel:  No Known Allergies  Current Outpatient Medications  Medication Sig Dispense Refill  . AJOVY 225 MG/1.5ML SOSY     . gabapentin (NEURONTIN) 300 MG capsule Take 300 mg by mouth at bedtime.     . LO LOESTRIN FE 1 MG-10 MCG / 10 MCG tablet Take 1 tablet by mouth daily. as directed  9  . methocarbamol (ROBAXIN) 500 MG tablet Take 1 tablet (500 mg total) by mouth at bedtime. (Patient taking differently: Take 500 mg by mouth as needed. ) 30 tablet 0  . ondansetron (ZOFRAN) 4 MG tablet TAKE 1 TABLET (4 MG TOTAL)  BY MOUTH DAILY AS NEEDED FOR NAUSEA OR VOMITING. 20 tablet 0  . promethazine (PHENERGAN) 12.5 MG tablet Take 1-2 tablets (12.5-25 mg total) by mouth once as needed for up to 1 dose for nausea or vomiting. With migraines. 20 tablet 2  . rizatriptan (MAXALT) 10 MG tablet Take 1 tablet (10 mg total) by mouth as needed for migraine. May repeat in 2 hours if needed. Max dose 3 pills in 24 hours 10 tablet 1  . sertraline (ZOLOFT) 100 MG tablet Take 100 mg by mouth daily.  2  . zonisamide (ZONEGRAN) 100 MG capsule take 2 tabs at hs  2  . albuterol (PROVENTIL HFA) 108 (90 Base) MCG/ACT inhaler Inhale 2 puffs into the lungs every 6 (six) hours as needed for wheezing or shortness of breath. 1 Inhaler 1   No current facility-administered medications for this visit.     OBJECTIVE: Vitals:   01/23/18 1530  BP: 111/78  Pulse: 77  Resp: 16  Temp: (!) 97.1 F (36.2 C)     Body mass index is 24.37 kg/m.    ECOG FS:0 - Asymptomatic  General: Well-developed, well-nourished, no acute distress. Eyes: Pink conjunctiva, anicteric sclera. Lungs: Clear to auscultation bilaterally. Heart: Regular rate and rhythm. No rubs, murmurs, or gallops. Abdomen: Soft, nontender, nondistended. No organomegaly noted, normoactive bowel sounds. Musculoskeletal: No edema, cyanosis, or clubbing. Neuro: Alert, answering all questions appropriately. Cranial nerves grossly intact. Skin: No rashes or petechiae noted. Psych: Normal affect.  LAB RESULTS:  Lab Results  Component Value Date   NA 139 07/31/2017   K 4.0 07/31/2017   CL 109 07/31/2017   CO2 22 07/31/2017   GLUCOSE 88 07/31/2017   BUN 13 07/31/2017   CREATININE 1.02 07/31/2017   CALCIUM 8.9 07/31/2017   PROT 6.9 07/31/2017   ALBUMIN 4.6 07/31/2017   AST 15 07/31/2017   ALT 10 07/31/2017   ALKPHOS 50 07/31/2017   BILITOT 0.6 07/31/2017   GFRNONAA >60 10/31/2015   GFRAA >60 10/31/2015    Lab Results  Component Value Date   WBC 7.2 01/23/2018    NEUTROABS 4.0 01/23/2018   HGB 15.1 01/23/2018   HCT 43.3 01/23/2018   MCV 93.9 01/23/2018   PLT 247 01/23/2018   Lab Results  Component Value Date   IRON 71 12/03/2017   TIBC 385 12/03/2017   IRONPCTSAT 18 12/03/2017   Lab Results  Component Value Date   FERRITIN 23 12/03/2017       STUDIES: No results found.  ASSESSMENT: Polycythemia, resolved.  PLAN:    1. Polycythemia: Resolved.  Patient's hemoglobin continues to be within normal limits.  Previously, all of her other laboratory work including erythropoietin levels, hemochromatosis gene mutation and Jak 2 mutation are all negative or within normal limits.  No intervention is needed at this time.  After discussion with the  patient, it was agreed upon that no further follow-up is necessary.  Please refer patient back if there are any questions or concerns.    I spent a total of 20 minutes face-to-face with the patient of which greater than 50% of the visit was spent in counseling and coordination of care as summarized above.  Patient expressed understanding and was in agreement with this plan. She also understands that She can call clinic at any time with any questions, concerns, or complaints.    Jeralyn Ruthsimothy J Donasia Wimes, MD   01/27/2018 8:40 AM

## 2018-01-21 ENCOUNTER — Encounter: Payer: Self-pay | Admitting: Physician Assistant

## 2018-01-21 ENCOUNTER — Ambulatory Visit: Payer: BC Managed Care – PPO | Admitting: Physician Assistant

## 2018-01-21 VITALS — BP 108/77 | HR 83 | Resp 13 | Ht 63.0 in | Wt 131.0 lb

## 2018-01-21 DIAGNOSIS — M7062 Trochanteric bursitis, left hip: Secondary | ICD-10-CM

## 2018-01-21 DIAGNOSIS — M7712 Lateral epicondylitis, left elbow: Secondary | ICD-10-CM | POA: Diagnosis not present

## 2018-01-21 DIAGNOSIS — M7061 Trochanteric bursitis, right hip: Secondary | ICD-10-CM

## 2018-01-21 DIAGNOSIS — M5136 Other intervertebral disc degeneration, lumbar region: Secondary | ICD-10-CM | POA: Diagnosis not present

## 2018-01-21 DIAGNOSIS — Z8669 Personal history of other diseases of the nervous system and sense organs: Secondary | ICD-10-CM

## 2018-01-21 DIAGNOSIS — R5383 Other fatigue: Secondary | ICD-10-CM

## 2018-01-21 DIAGNOSIS — Z87442 Personal history of urinary calculi: Secondary | ICD-10-CM | POA: Diagnosis not present

## 2018-01-21 DIAGNOSIS — F5101 Primary insomnia: Secondary | ICD-10-CM

## 2018-01-21 DIAGNOSIS — M7711 Lateral epicondylitis, right elbow: Secondary | ICD-10-CM

## 2018-01-21 DIAGNOSIS — M797 Fibromyalgia: Secondary | ICD-10-CM

## 2018-01-21 DIAGNOSIS — Z8719 Personal history of other diseases of the digestive system: Secondary | ICD-10-CM | POA: Diagnosis not present

## 2018-01-23 ENCOUNTER — Inpatient Hospital Stay: Payer: BC Managed Care – PPO | Attending: Oncology

## 2018-01-23 ENCOUNTER — Inpatient Hospital Stay (HOSPITAL_BASED_OUTPATIENT_CLINIC_OR_DEPARTMENT_OTHER): Payer: BC Managed Care – PPO | Admitting: Oncology

## 2018-01-23 VITALS — BP 111/78 | HR 77 | Temp 97.1°F | Resp 16 | Wt 137.6 lb

## 2018-01-23 DIAGNOSIS — F418 Other specified anxiety disorders: Secondary | ICD-10-CM | POA: Diagnosis not present

## 2018-01-23 DIAGNOSIS — D751 Secondary polycythemia: Secondary | ICD-10-CM | POA: Insufficient documentation

## 2018-01-23 DIAGNOSIS — M5136 Other intervertebral disc degeneration, lumbar region: Secondary | ICD-10-CM | POA: Insufficient documentation

## 2018-01-23 DIAGNOSIS — M797 Fibromyalgia: Secondary | ICD-10-CM | POA: Insufficient documentation

## 2018-01-23 DIAGNOSIS — Z79899 Other long term (current) drug therapy: Secondary | ICD-10-CM | POA: Diagnosis not present

## 2018-01-23 DIAGNOSIS — K589 Irritable bowel syndrome without diarrhea: Secondary | ICD-10-CM

## 2018-01-23 DIAGNOSIS — F419 Anxiety disorder, unspecified: Secondary | ICD-10-CM | POA: Insufficient documentation

## 2018-01-23 DIAGNOSIS — Z803 Family history of malignant neoplasm of breast: Secondary | ICD-10-CM

## 2018-01-23 LAB — CBC WITH DIFFERENTIAL/PLATELET
BASOS ABS: 0.1 10*3/uL (ref 0–0.1)
Basophils Relative: 1 %
Eosinophils Absolute: 0.2 10*3/uL (ref 0–0.7)
Eosinophils Relative: 2 %
HEMATOCRIT: 43.3 % (ref 35.0–47.0)
HEMOGLOBIN: 15.1 g/dL (ref 12.0–16.0)
LYMPHS PCT: 34 %
Lymphs Abs: 2.5 10*3/uL (ref 1.0–3.6)
MCH: 32.8 pg (ref 26.0–34.0)
MCHC: 35 g/dL (ref 32.0–36.0)
MCV: 93.9 fL (ref 80.0–100.0)
MONO ABS: 0.5 10*3/uL (ref 0.2–0.9)
Monocytes Relative: 7 %
NEUTROS PCT: 56 %
Neutro Abs: 4 10*3/uL (ref 1.4–6.5)
Platelets: 247 10*3/uL (ref 150–440)
RBC: 4.61 MIL/uL (ref 3.80–5.20)
RDW: 12.2 % (ref 11.5–14.5)
WBC: 7.2 10*3/uL (ref 3.6–11.0)

## 2018-02-13 ENCOUNTER — Other Ambulatory Visit: Payer: Self-pay | Admitting: Family

## 2018-02-13 DIAGNOSIS — G43009 Migraine without aura, not intractable, without status migrainosus: Secondary | ICD-10-CM

## 2018-02-13 DIAGNOSIS — R112 Nausea with vomiting, unspecified: Secondary | ICD-10-CM

## 2018-02-14 NOTE — Telephone Encounter (Signed)
Last filled 01/20/18 Last office visit 11/25/17 No office visit scheduled

## 2018-04-15 ENCOUNTER — Encounter: Payer: Self-pay | Admitting: Family Medicine

## 2018-04-15 ENCOUNTER — Ambulatory Visit: Payer: BC Managed Care – PPO | Admitting: Family Medicine

## 2018-04-15 VITALS — BP 118/72 | HR 85 | Temp 99.0°F | Ht 63.0 in | Wt 140.8 lb

## 2018-04-15 DIAGNOSIS — N390 Urinary tract infection, site not specified: Secondary | ICD-10-CM

## 2018-04-15 DIAGNOSIS — R3915 Urgency of urination: Secondary | ICD-10-CM | POA: Diagnosis not present

## 2018-04-15 DIAGNOSIS — R350 Frequency of micturition: Secondary | ICD-10-CM

## 2018-04-15 MED ORDER — SULFAMETHOXAZOLE-TRIMETHOPRIM 800-160 MG PO TABS: 1.0000 | ORAL_TABLET | Freq: Two times a day (BID) | ORAL | 0 refills | Status: AC

## 2018-04-15 NOTE — Progress Notes (Signed)
   Subjective:    Patient ID: Alejandra Cook, female    DOB: Apr 08, 1981, 37 y.o.   MRN: [id number]  HPI  Patient presents to clinic c/o painful urination, low back pain, increased urgency and urinary frequency for 3 days.  Has been using AZO with some help in symptom relief.  Denies fever, chills, nausea, vomiting or diarrhea.   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   Patient Active Problem List   Diagnosis Date Noted  . Polycythemia 12/01/2017  . History of migraine 12/20/2016  . Other fatigue 12/20/2016  . Primary insomnia 12/20/2016  . History of IBS 12/20/2016  . History of renal calculi 12/20/2016  . History of sinusitis 12/20/2016  . Bug bites 01/23/2016  . Bronchitis 08/11/2014  . Routine general medical examination at a health care facility 01/21/2013  . Fibromyalgia 01/17/2012  . Allergic rhinitis 01/17/2012  . Migraine headache 05/12/2007  . DEGENERATIVE DISC DISEASE, LUMBAR SPINE 05/12/2007   Social History   Tobacco Use  . Smoking status: Never Smoker  . Smokeless tobacco: Never Used  Substance Use Topics  . Alcohol use: No    Alcohol/week: 0.0 standard drinks    Review of Systems   Constitutional: Negative for chills, fatigue and fever.  HENT: Negative for congestion, ear pain, sinus pain and sore throat.   Eyes: Negative.   Respiratory: Negative for cough, shortness of breath and wheezing.   Cardiovascular: Negative for chest pain, palpitations and leg swelling.  Gastrointestinal: Negative for abdominal pain, diarrhea, nausea and vomiting.  Genitourinary: + dysuria, frequency and urgency.  Musculoskeletal: Negative for arthralgias and myalgias.  Skin: Negative for color change, pallor and rash.  Neurological: Negative for syncope, light-headedness and headaches.  Psychiatric/Behavioral: The patient is not nervous/anxious.         Objective:   Physical Exam  Constitutional: She is oriented to person, place, and time. She appears well-nourished. No distress.  HENT:  Head: Normocephalic and atraumatic.  Eyes: EOM are normal. No scleral icterus.  Neck: Normal range of motion. Neck supple. No tracheal deviation present.  Cardiovascular: Normal rate and regular rhythm.  Pulmonary/Chest: Effort normal and breath sounds normal. No respiratory distress.  Abdominal: Soft. Bowel sounds are normal. There is tenderness.  Mild suprapubic tenderness. No CVA tenderness.   Musculoskeletal: She exhibits no edema.  Gait normal  Neurological: She is alert and oriented to person, place, and time.  Skin: Skin is warm and dry. No pallor.  Psychiatric: She has a normal mood and affect. Her behavior is normal.  Nursing note and vitals reviewed.     Vitals:   04/15/18 1627  BP: 118/72  Pulse: 85  Temp: 99 F (37.2 C)  SpO2: 91%    Assessment & Plan:   UTI/urgency/frequency -- We did not dip urine due to it being discolored orange from AZO. Will send for culture. She will take bactrim BID for 5 days. Increase water intake, wear cotton underwear, avoid holding urine for long periods of time.  Follow up if symptoms persist or worsen.

## 2018-04-15 NOTE — Patient Instructions (Signed)

## 2018-04-16 ENCOUNTER — Encounter: Payer: Self-pay | Admitting: Family Medicine

## 2018-04-17 ENCOUNTER — Encounter: Payer: Self-pay | Admitting: Family

## 2018-04-18 ENCOUNTER — Other Ambulatory Visit: Payer: Self-pay | Admitting: Lab

## 2018-04-18 DIAGNOSIS — R350 Frequency of micturition: Secondary | ICD-10-CM

## 2018-04-18 NOTE — Addendum Note (Signed)
Addended by: Warden FillersWRIGHT, LATOYA S on: 04/18/2018 04:21 PM   Modules accepted: Orders

## 2018-04-19 LAB — URINE CULTURE
MICRO NUMBER:: 91100358
RESULT: NO GROWTH
SPECIMEN QUALITY: ADEQUATE

## 2018-04-30 ENCOUNTER — Telehealth: Payer: BC Managed Care – PPO | Admitting: Family Medicine

## 2018-04-30 DIAGNOSIS — J329 Chronic sinusitis, unspecified: Secondary | ICD-10-CM

## 2018-04-30 MED ORDER — AMOXICILLIN-POT CLAVULANATE 875-125 MG PO TABS: 1.0000 | ORAL_TABLET | Freq: Two times a day (BID) | ORAL | 0 refills | Status: AC

## 2018-04-30 NOTE — Progress Notes (Signed)

## 2018-06-09 ENCOUNTER — Other Ambulatory Visit: Payer: Self-pay | Admitting: Obstetrics and Gynecology

## 2018-06-09 DIAGNOSIS — R928 Other abnormal and inconclusive findings on diagnostic imaging of breast: Secondary | ICD-10-CM

## 2018-06-13 ENCOUNTER — Other Ambulatory Visit: Payer: Self-pay | Admitting: Obstetrics and Gynecology

## 2018-06-13 ENCOUNTER — Ambulatory Visit
Admission: RE | Admit: 2018-06-13 | Discharge: 2018-06-13 | Disposition: A | Discharge status: Home / Self Care | Payer: BC Managed Care – PPO | Source: Ambulatory Visit | Attending: Obstetrics and Gynecology | Admitting: Obstetrics and Gynecology

## 2018-06-13 DIAGNOSIS — R928 Other abnormal and inconclusive findings on diagnostic imaging of breast: Secondary | ICD-10-CM

## 2018-06-13 DIAGNOSIS — N6009 Solitary cyst of unspecified breast: Secondary | ICD-10-CM

## 2018-07-09 NOTE — Progress Notes (Deleted)
Office Visit Note  Patient: Alejandra Cook             Date of Birth: 21-May-1981           MRN: [id number]             PCP: Allegra GranaArnett, Margaret G, FNP Referring: Allegra GranaArnett, Margaret G, FNP Visit Date: 07/23/2018 Occupation: @GUAROCC @  Subjective:  No chief complaint on file.   History of Present Illness: Alejandra Cook is a 37 y.o. female ***   Activities of Daily Living:  Patient reports morning stiffness for *** {minute/hour:19697}.   Patient {ACTIONS;DENIES/REPORTS:21021675::"Denies"} nocturnal pain.  Difficulty dressing/grooming: {ACTIONS;DENIES/REPORTS:21021675::"Denies"} Difficulty climbing stairs: {ACTIONS;DENIES/REPORTS:21021675::"Denies"} Difficulty getting out of chair: {ACTIONS;DENIES/REPORTS:21021675::"Denies"} Difficulty using hands for taps, buttons, cutlery, and/or writing: {ACTIONS;DENIES/REPORTS:21021675::"Denies"}  No Rheumatology ROS completed.   PMFS History:  Patient Active Problem List   Diagnosis Date Noted  . Polycythemia 12/01/2017  . History of migraine 12/20/2016  . Other fatigue 12/20/2016  . Primary insomnia 12/20/2016  . History of IBS 12/20/2016  . History of renal calculi 12/20/2016  . History of sinusitis 12/20/2016  . Bug bites 01/23/2016  . Bronchitis 08/11/2014  . Routine general medical examination at a health care facility 01/21/2013  . Fibromyalgia 01/17/2012  . Allergic rhinitis 01/17/2012  . Migraine headache 05/12/2007  . DEGENERATIVE DISC DISEASE, LUMBAR SPINE 05/12/2007    Past Medical History:  Diagnosis Date  . Allergy    Dr. Gwen PoundsKowalski, gold and nickel allergy  . Anxiety   . Bulging discs   . Degenerative disc disease, lumbar   . Depression    off of zoloft for 1 year, had problems with depression and anxiety after last child  . Fibromyalgia    after pregnancy, Followed by [facility]  . Fibromyalgia   . NWGNFAOZ(308.6Headache(784.0)    Dr. Neale [facility]Freeman at [facility]; migraines since elementary   . IBS (irritable bowel syndrome)    . 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 Spontaneous abortion in second trimester Dec 2014    Family History  Problem Relation Age of Onset  . Healthy Mother   . Hypertension Father   . Cancer Maternal Grandmother        cancer, breast  . Cancer Paternal Grandmother        breast  . Healthy Sister   . Hypertension Paternal Grandfather   . Healthy Daughter   . Healthy Son    Past Surgical History:  Procedure Laterality Date  . COLONOSCOPY     normal  . COLPOSCOPY  2002  . CYSTOSCOPY     ureteral stent inserted  . DILATION AND EVACUATION N/A 08/07/2013   Procedure: DILATATION AND EVACUATION;  Surgeon: Zelphia CairoGretchen Adkins, MD;  Location: [facility];  Service: Gynecology;  Laterality: N/A;  . LAPAROSCOPY N/A 03/20/2013   Procedure: LAPAROSCOPY OPERATIVE  IUD REMOVAL;  Surgeon: Zelphia CairoGretchen Adkins, MD;  Location: [facility];  Service: Gynecology;  Laterality: N/A;  Intraabdominal from [place]  . NASAL SINUS SURGERY    . PILONIDAL CYST EXCISION    . VAGINAL DELIVERY  2011   Social History   Social History Narrative   [place]ives in [place] 2YO daughter.      Works- 10[employer] 11[employer]; [employer]       Objective: Vital Signs: There were no vitals taken for this visit.   Physical Exam   Musculoskeletal Exam: ***  CDAI Exam: CDAI Score: Not documented Patient Global Assessment: Not documented; Provider Global Assessment: Not documented Swollen:  Not documented; Tender: Not documented Joint Exam   Not documented   There is currently no information documented on the homunculus. Go to the Rheumatology activity and complete the homunculus joint exam.  Investigation: No additional findings.  Imaging: US Breast Ltd Uni Left Inc Axilla  Result Date: 06/13/2018 CLINICAL DATA:  Patient was called back from screening mammogram for possible asymmetries in both breast. EXAM: DIGITAL DIAGNOSTIC BILATERAL MAMMOGRAM WITH CAD AND TOMO ULTRASOUND BILATERAL BREAST COMPARISON:   Baseline screening mammogram dated 06/05/2018. ACR Breast Density Category c: The breast tissue is heterogeneously dense, which may obscure small masses. FINDINGS: Additional imaging of both breast was performed. There is persistence of an asymmetry in the lateral aspect of the left breast. There is a well-circumscribed 7 mm mass in the medial aspect of the right. There are no malignant type microcalcifications in either breast. Mammographic images were processed with CAD. On physical exam, I do not palpate a mass in the lateral aspect of the left breast or the medial aspect of the right breast. Targeted ultrasound is performed, showing a well-circumscribed hypoechoic mass in the right breast at 3 o'clock 3 cm from the nipple with increased through transmission measuring 6 x 4 x 3 mm. This is likely a complex cyst. There is a cluster of well-circumscribed near anechoic masses in the left breast at 9 o'clock 6 cm from the nipple measuring 7 x 2 x 6 mm. This is likely a cluster of cysts (apocrine metaplasia). IMPRESSION: Probable benign cysts in both breast. RECOMMENDATION: Bilateral breast ultrasound in 6 months is recommended. I have discussed the findings and recommendations with the patient. Results were also provided in writing at the conclusion of the visit. If applicable, a reminder letter will be sent to the patient regarding the next appointment. BI-RADS CATEGORY  3: Probably benign. Electronically Signed   By: Baird Lyons M.D.   On: 06/13/2018 11:47   US Breast Ltd Uni Right Inc Axilla  Result Date: 06/13/2018 CLINICAL DATA:  Patient was called back from screening mammogram for possible asymmetries in both breast. EXAM: DIGITAL DIAGNOSTIC BILATERAL MAMMOGRAM WITH CAD AND TOMO ULTRASOUND BILATERAL BREAST COMPARISON:  Baseline screening mammogram dated 06/05/2018. ACR Breast Density Category c: The breast tissue is heterogeneously dense, which may obscure small masses. FINDINGS: Additional imaging of both  breast was performed. There is persistence of an asymmetry in the lateral aspect of the left breast. There is a well-circumscribed 7 mm mass in the medial aspect of the right. There are no malignant type microcalcifications in either breast. Mammographic images were processed with CAD. On physical exam, I do not palpate a mass in the lateral aspect of the left breast or the medial aspect of the right breast. Targeted ultrasound is performed, showing a well-circumscribed hypoechoic mass in the right breast at 3 o'clock 3 cm from the nipple with increased through transmission measuring 6 x 4 x 3 mm. This is likely a complex cyst. There is a cluster of well-circumscribed near anechoic masses in the left breast at 9 o'clock 6 cm from the nipple measuring 7 x 2 x 6 mm. This is likely a cluster of cysts (apocrine metaplasia). IMPRESSION: Probable benign cysts in both breast. RECOMMENDATION: Bilateral breast ultrasound in 6 months is recommended. I have discussed the findings and recommendations with the patient. Results were also provided in writing at the conclusion of the visit. If applicable, a reminder letter will be sent to the patient regarding the next appointment. BI-RADS CATEGORY  3:  Probably benign. Electronically Signed   By: Baird Lyons M.D.   On: 06/13/2018 11:47   Mm Diag Breast Tomo Bilateral  Result Date: 06/13/2018 CLINICAL DATA:  Patient was called back from screening mammogram for possible asymmetries in both breast. EXAM: DIGITAL DIAGNOSTIC BILATERAL MAMMOGRAM WITH CAD AND TOMO ULTRASOUND BILATERAL BREAST COMPARISON:  Baseline screening mammogram dated 06/05/2018. ACR Breast Density Category c: The breast tissue is heterogeneously dense, which may obscure small masses. FINDINGS: Additional imaging of both breast was performed. There is persistence of an asymmetry in the lateral aspect of the left breast. There is a well-circumscribed 7 mm mass in the medial aspect of the right. There are no  malignant type microcalcifications in either breast. Mammographic images were processed with CAD. On physical exam, I do not palpate a mass in the lateral aspect of the left breast or the medial aspect of the right breast. Targeted ultrasound is performed, showing a well-circumscribed hypoechoic mass in the right breast at 3 o'clock 3 cm from the nipple with increased through transmission measuring 6 x 4 x 3 mm. This is likely a complex cyst. There is a cluster of well-circumscribed near anechoic masses in the left breast at 9 o'clock 6 cm from the nipple measuring 7 x 2 x 6 mm. This is likely a cluster of cysts (apocrine metaplasia). IMPRESSION: Probable benign cysts in both breast. RECOMMENDATION: Bilateral breast ultrasound in 6 months is recommended. I have discussed the findings and recommendations with the patient. Results were also provided in writing at the conclusion of the visit. If applicable, a reminder letter will be sent to the patient regarding the next appointment. BI-RADS CATEGORY  3: Probably benign. Electronically Signed   By: Baird Lyons M.D.   On: 06/13/2018 11:47    Recent Labs: Lab Results  Component Value Date   WBC 7.2 01/23/2018   HGB 15.1 01/23/2018   PLT 247 01/23/2018   NA 139 07/31/2017   K 4.0 07/31/2017   CL 109 07/31/2017   CO2 22 07/31/2017   GLUCOSE 88 07/31/2017   BUN 13 07/31/2017   CREATININE 1.02 07/31/2017   BILITOT 0.6 07/31/2017   ALKPHOS 50 07/31/2017   AST 15 07/31/2017   ALT 10 07/31/2017   PROT 6.9 07/31/2017   ALBUMIN 4.6 07/31/2017   CALCIUM 8.9 07/31/2017   GFRAA >60 10/31/2015    Speciality Comments: No specialty comments available.  Procedures:  No procedures performed Allergies: Patient has no known allergies.   Assessment / Plan:     Visit Diagnoses: Fibromyalgia - takes Robaxin as needed for muscle spasms as well as gabapentin 300 mg at bedtime   Other fatigue  Primary insomnia  Lateral epicondylitis of both elbows  DDD  (degenerative disc disease), lumbar  Trochanteric bursitis of both hips  History of IBS  History of migraine  History of renal calculi   Orders: No orders of the defined types were placed in this encounter.  No orders of the defined types were placed in this encounter.   Face-to-face time spent with patient was *** minutes. Greater than 50% of time was spent in counseling and coordination of care.  Follow-Up Instructions: No follow-ups on file.   Gearldine Bienenstock, PA-C  Note - This record has been created using Dragon software.  Chart creation errors have been sought, but may not always  have been located. Such creation errors do not reflect on  the standard of medical care.

## 2018-07-23 ENCOUNTER — Ambulatory Visit: Payer: BC Managed Care – PPO | Admitting: Physician Assistant

## 2018-08-15 ENCOUNTER — Ambulatory Visit: Payer: BC Managed Care – PPO | Admitting: Family

## 2018-08-15 ENCOUNTER — Encounter: Payer: Self-pay | Admitting: Family

## 2018-08-15 VITALS — BP 98/62 | HR 80 | Temp 98.4°F | Wt 142.6 lb

## 2018-08-15 DIAGNOSIS — M545 Low back pain, unspecified: Secondary | ICD-10-CM

## 2018-08-15 MED ORDER — MELOXICAM 7.5 MG PO TABS: 7.5000 mg | ORAL_TABLET | Freq: Every day | ORAL | 0 refills | Status: DC

## 2018-08-15 MED ORDER — PREDNISONE 10 MG PO TABS: ORAL_TABLET | ORAL | 0 refills | Status: DC

## 2018-08-15 NOTE — Progress Notes (Signed)
Subjective:    Patient ID: Alejandra Cook, female    DOB: 1981-02-11, 38 y.o.   MRN: [id number]  CC: Alejandra Cook is a 38 y.o. female who presents today for an acute visit   HPI: CC: left sided low back pain, 7 days ago, unchanged.  Lifted a box last week, and pain shot up from tailbone to buttocks. No leg pain, weakness, trouble urinating.  Tried ibuprofen, heat and ice without relief.. Not worse with activity.    On gabapentin 300mg  at bedtime for headaches.     Fell on month ago when slipped on pajamas and fell on tailbone down for steps.  This pain had improved.     HISTORY:  Past Medical History:  Diagnosis Date  . Allergy    Dr. Gwen PoundsKowalski, gold and nickel allergy  . Anxiety   . Bulging discs   . Degenerative disc disease, lumbar   . Depression    off of zoloft for 1 year, had problems with depression and anxiety after last child  . Fibromyalgia    after pregnancy, Followed by [facility]  . Fibromyalgia   . WUJWJXBJ(478.2Headache(784.0)    Dr. Neale [facility]Freeman at [facility]; migraines since elementary   . IBS (irritable bowel syndrome)   . 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 Spontaneous abortion in second trimester Dec 2014   Past Surgical History:  Procedure Laterality Date  . COLONOSCOPY     normal  . COLPOSCOPY  2002  . CYSTOSCOPY     ureteral stent inserted  . DILATION AND EVACUATION N/A 08/07/2013   Procedure: DILATATION AND EVACUATION;  Surgeon: Zelphia CairoGretchen Adkins, MD;  Location: [facility];  Service: Gynecology;  Laterality: N/A;  . LAPAROSCOPY N/A 03/20/2013   Procedure: LAPAROSCOPY OPERATIVE  IUD REMOVAL;  Surgeon: Zelphia CairoGretchen Adkins, MD;  Location: [facility];  Service: Gynecology;  Laterality: N/A;  Intraabdominal from [place]  . NASAL SINUS SURGERY    . PILONIDAL CYST EXCISION    . VAGINAL DELIVERY  2011   Family History  Problem Relation Age of Onset  . Healthy Mother   . Hypertension Father   . Cancer Maternal Grandmother    cancer, breast  . Cancer Paternal Grandmother        breast  . Healthy Sister   . Hypertension Paternal Grandfather   . Healthy Daughter   . Healthy Son     Allergies: Patient has no known allergies. Current Outpatient Medications on File Prior to Visit  Medication Sig Dispense Refill  . AJOVY 225 MG/1.5ML SOSY     . LO LOESTRIN FE 1 MG-10 MCG / 10 MCG tablet Take 1 tablet by mouth daily. as directed  9  . methocarbamol (ROBAXIN) 500 MG tablet Take 1 tablet (500 mg total) by mouth at bedtime. (Patient taking differently: Take 500 mg by mouth as needed. ) 30 tablet 0  . ondansetron (ZOFRAN) 4 MG tablet TAKE 1 TABLET (4 MG TOTAL) BY MOUTH DAILY AS NEEDED FOR NAUSEA OR VOMITING. 18 tablet 1  . promethazine (PHENERGAN) 12.5 MG tablet Take 1-2 tablets (12.5-25 mg total) by mouth once as needed for up to 1 dose for nausea or vomiting. With migraines. 20 tablet 2  . rizatriptan (MAXALT) 10 MG tablet Take 1 tablet (10 mg total) by mouth as needed for migraine. May repeat in 2 hours if needed. Max dose 3 pills in 24 hours 10 tablet 1  . sertraline (ZOLOFT) 100 MG tablet Take 100 mg  by mouth daily.  2  . zonisamide (ZONEGRAN) 100 MG capsule take 2 tabs at hs  2  . gabapentin (NEURONTIN) 300 MG capsule Take 300 mg by mouth at bedtime.      No current facility-administered medications on file prior to visit.     Social History   Tobacco Use  . Smoking status: Never Smoker  . Smokeless tobacco: Never Used  Substance Use Topics  . Alcohol use: No    Alcohol/week: 0.0 standard drinks  . Drug use: Never    Review of Systems  Constitutional: Negative for chills and fever.  Respiratory: Negative for cough.   Cardiovascular: Negative for chest pain and palpitations.  Gastrointestinal: Negative for nausea and vomiting.  Genitourinary: Negative for difficulty urinating.  Musculoskeletal: Positive for back pain. Negative for gait problem and joint swelling.  Neurological: Negative for dizziness,  numbness and headaches.      Objective:    BP 98/62 (BP Location: Left Arm, Patient Position: Sitting, Cuff Size: Normal)   Pulse 80   Temp 98.4 F (36.9 C)   Wt 142 lb 9.6 oz (64.7 kg)   SpO2 98%   BMI 25.26 kg/m  BP Readings from Last 3 Encounters:  08/15/18 98/62  04/15/18 118/72  01/23/18 111/78   Wt Readings from Last 3 Encounters:  08/15/18 142 lb 9.6 oz (64.7 kg)  04/15/18 140 lb 12.8 oz (63.9 kg)  01/23/18 137 lb 9.6 oz (62.4 kg)    Physical Exam Vitals signs reviewed.  Constitutional:      Appearance: She is well-developed.  Eyes:     Conjunctiva/sclera: Conjunctivae normal.  Cardiovascular:     Rate and Rhythm: Normal rate and regular rhythm.     Pulses: Normal pulses.     Heart sounds: Normal heart sounds.  Pulmonary:     Effort: Pulmonary effort is normal.     Breath sounds: Normal breath sounds. No wheezing, rhonchi or rales.  Abdominal:     Tenderness: There is no right CVA tenderness or left CVA tenderness.  Musculoskeletal:     Lumbar back: She exhibits normal range of motion, no tenderness, no bony tenderness, no swelling, no edema, no pain and no spasm.     Comments: Full range of motion with flexion, tension, lateral side bends. No bony tenderness. No pain, numbness, tingling elicited with single leg raise bilaterally.   Skin:    General: Skin is warm and dry.  Neurological:     Mental Status: She is alert.     Sensory: No sensory deficit.     Deep Tendon Reflexes:     Reflex Scores:      Patellar reflexes are 2+ on the right side and 2+ on the left side.    Comments: Sensation and strength intact bilateral lower extremities.  Psychiatric:        Speech: Speech normal.        Behavior: Behavior normal.        Thought Content: Thought content normal.        Assessment & Plan:  1. Acute right-sided low back pain without sciatica Suspect left SI joint etiology. No red flags. Patient declines XR sacrum/ coccyx and would like to trial  conservative therapy. She will let me know if not better and we will pursue imaging, orthopedics referral.   - meloxicam (MOBIC) 7.5 MG tablet; Take 1 tablet (7.5 mg total) by mouth daily. With food  Dispense: 30 tablet; Refill: 0 - predniSONE (DELTASONE) 10 MG tablet;  Take 40 mg by mouth on day 1, then taper 10 mg daily until gone  Dispense: 10 tablet; Refill: 0    I have discontinued Madalynn H. Seldon's albuterol. I am also having her maintain her LO LOESTRIN FE, sertraline, zonisamide, rizatriptan, methocarbamol, promethazine, AJOVY, gabapentin, and ondansetron.   No orders of the defined types were placed in this encounter.   Return precautions given.   Risks, benefits, and alternatives of the medications and treatment plan prescribed today were discussed, and patient expressed understanding.   Education regarding symptom management and diagnosis given to patient on AVS.  Continue to follow with Allegra GranaArnett, Joleah Kosak G, FNP for routine health maintenance.   Alejandra Cook and I agreed with plan.   Rennie PlowmanMargaret Chynah Orihuela, FNP

## 2018-08-15 NOTE — Patient Instructions (Signed)
Please start prednisone tomorrow as it would interfere with your sleep if started today. You may take the meloxicam which is an anti-inflammatory.  Please do not use any other anti-inflammatories.  Please ensure you take this medication with food. As discussed, if back pain persists, you may take an additional dose of gabapentin 300 mg during the day if needed.   Please let me know if not better.

## 2018-09-06 ENCOUNTER — Other Ambulatory Visit (INDEPENDENT_AMBULATORY_CARE_PROVIDER_SITE_OTHER): Payer: Self-pay | Admitting: Rheumatology

## 2018-09-08 NOTE — Telephone Encounter (Signed)
Called patient to schedule follow-up appointment.  Patient's voicemail was full / unable to leave message.

## 2018-09-08 NOTE — Telephone Encounter (Signed)
Last Visit: 01/21/18 Next Visit due December 2019. Message sent to the front to schedule   Okay to refill 30 day supply per Dr. Corliss Skains

## 2018-09-08 NOTE — Telephone Encounter (Signed)
Please schedule patient a follow up visit. Patient was due December 2019. Thanks!  

## 2018-09-09 ENCOUNTER — Ambulatory Visit: Payer: BC Managed Care – PPO | Admitting: Family Medicine

## 2018-09-09 DIAGNOSIS — S60222A Contusion of left hand, initial encounter: Secondary | ICD-10-CM | POA: Insufficient documentation

## 2018-10-20 ENCOUNTER — Other Ambulatory Visit (INDEPENDENT_AMBULATORY_CARE_PROVIDER_SITE_OTHER): Payer: Self-pay | Admitting: Rheumatology

## 2018-10-20 ENCOUNTER — Other Ambulatory Visit: Payer: Self-pay | Admitting: Family

## 2018-10-20 DIAGNOSIS — M545 Low back pain, unspecified: Secondary | ICD-10-CM

## 2018-10-20 NOTE — Telephone Encounter (Signed)
Last visit: 01/21/18 Next Visit: 12/16/18  Okay to refill per Dr. Corliss Skains

## 2018-10-21 NOTE — Telephone Encounter (Signed)
Last OV 08/15/2018  Last refilled 08/15/2018 disp 30 with no refills   Next appt none scheduled  Sent to PCP

## 2018-11-07 DIAGNOSIS — G43119 Migraine with aura, intractable, without status migrainosus: Secondary | ICD-10-CM | POA: Insufficient documentation

## 2018-11-19 ENCOUNTER — Encounter: Payer: Self-pay | Admitting: Family

## 2018-11-20 ENCOUNTER — Ambulatory Visit: Payer: BC Managed Care – PPO | Admitting: Family Medicine

## 2018-11-20 ENCOUNTER — Other Ambulatory Visit: Payer: Self-pay

## 2018-11-20 ENCOUNTER — Encounter: Payer: Self-pay | Admitting: Family Medicine

## 2018-11-20 VITALS — BP 100/62 | HR 83 | Temp 98.1°F | Resp 16 | Ht 63.0 in | Wt 138.6 lb

## 2018-11-20 DIAGNOSIS — G43009 Migraine without aura, not intractable, without status migrainosus: Secondary | ICD-10-CM | POA: Diagnosis not present

## 2018-11-20 MED ORDER — DIPHENHYDRAMINE HCL 50 MG/ML IJ SOLN
50.0000 mg | Freq: Once | INTRAMUSCULAR | Status: AC
  Administered 2018-11-20: 50 mg via INTRAVENOUS

## 2018-11-20 MED ORDER — KETOROLAC TROMETHAMINE 60 MG/2ML IM SOLN
60.0000 mg | Freq: Once | INTRAMUSCULAR | Status: AC
  Administered 2018-11-20: 60 mg via INTRAMUSCULAR

## 2018-11-20 MED ORDER — PROMETHAZINE HCL 25 MG/ML IJ SOLN
25.0000 mg | Freq: Once | INTRAMUSCULAR | Status: AC
  Administered 2018-11-20: 25 mg via INTRAMUSCULAR

## 2018-11-20 NOTE — Progress Notes (Signed)
Subjective:    Patient ID: Alejandra Cook, female    DOB: 1980-12-18, 38 y.o.   MRN: [id number]  HPI   Patient presents to clinic due to migraine headache.  Patient has a longstanding history of chronic migraines.  She connected with her neurologist via video chat and was sent in a prescription of Fioricet.  Patient states she took a dose of Fioricet yesterday as well as a dose of Maxalt without success in breaking the migraine.  Patient states she has no head migraine headache for almost a whole week without any relief.  States that she usually gets this level she will need injections to break the migraine.  Denies aura.  Denies loss of visual field.  Denies any facial weakness or one-sided extremity weakness.  Denies episodes of confusion or speech difficulty.  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   Patient Active Problem List   Diagnosis Date Noted  . Polycythemia 12/01/2017  . History of migraine 12/20/2016  . Other fatigue 12/20/2016  . Primary insomnia 12/20/2016  . History of IBS 12/20/2016  . History of renal calculi 12/20/2016  . History of sinusitis 12/20/2016  . Bug bites 01/23/2016  . Bronchitis 08/11/2014  . Routine general medical examination at a health care facility 01/21/2013  . Fibromyalgia 01/17/2012  . Allergic rhinitis 01/17/2012  . Migraine headache 05/12/2007  . DEGENERATIVE DISC DISEASE, LUMBAR SPINE 05/12/2007   Social History   Tobacco Use  . Smoking status: Never Smoker  . Smokeless tobacco: Never Used  Substance Use Topics  . Alcohol use: No    Alcohol/week: 0.0 standard drinks   Review of Systems  Constitutional: Negative for chills, fatigue and fever.  HENT: Negative for congestion, ear pain, sinus pain and sore throat.   Eyes: Negative.   Respiratory: Negative for cough, shortness of breath and wheezing.   Cardiovascular: Negative for  chest pain, palpitations and leg swelling.  Gastrointestinal: Negative for abdominal pain, diarrhea, nausea and vomiting.  Genitourinary: Negative for dysuria, frequency and urgency.  Musculoskeletal: Negative for arthralgias and myalgias.  Skin: Negative for color change, pallor and rash.  Neurological: Negative for syncope, light-headedness. +migraine headache Psychiatric/Behavioral: The patient is not nervous/anxious.       Objective:   Physical Exam Vitals signs and nursing note reviewed.  Constitutional:      General: She is not in acute distress.    Appearance: She is well-developed. She is not ill-appearing or toxic-appearing.  HENT:     Head: Normocephalic and atraumatic.     Right Ear: Tympanic membrane, ear canal and external ear normal.     Left Ear: Tympanic membrane, ear canal and external ear normal.  Eyes:     General: No scleral icterus.       Right eye: No discharge.        Left eye: No discharge.     Extraocular Movements: Extraocular movements intact.     Conjunctiva/sclera: Conjunctivae normal.     Pupils: Pupils are equal, round, and reactive to light.  Neck:     Musculoskeletal: Normal range of motion and neck supple.     Trachea: No tracheal deviation.  Cardiovascular:     Rate and Rhythm: Normal rate and regular rhythm.     Heart sounds: Normal heart sounds.  Pulmonary:     Effort: Pulmonary effort is normal. No respiratory distress.  Breath sounds: Normal breath sounds. No wheezing, rhonchi or rales.  Abdominal:     General: Bowel sounds are normal.     Palpations: Abdomen is soft.     Tenderness: There is no abdominal tenderness. There is no guarding.  Musculoskeletal: Normal range of motion.        General: No deformity.  Lymphadenopathy:     Cervical: No cervical adenopathy.  Skin:    General: Skin is warm and dry.     Capillary Refill: Capillary refill takes less than 2 seconds.     Coloration: Skin is not jaundiced or pale.   Neurological:     General: No focal deficit present.     Mental Status: She is alert and oriented to person, place, and time.     Cranial Nerves: No cranial nerve deficit or facial asymmetry.     Gait: Gait normal.  Psychiatric:        Behavior: Behavior normal.        Thought Content: Thought content normal.     Vitals:   11/20/18 0834  BP: 100/62  Pulse: 83  Resp: 16  Temp: 98.1 F (36.7 C)  SpO2: 99%      Assessment & Plan:   Migraine without aura- patient will get IM injections of Toradol, Phenergan and Benadryl in clinic to break the migraine.  After leaving clinic today she will go home and has been advised to lay down and take a good nap.  Patient encouraged to keep up good fluid intake, eat a balanced diet, and be sure she is getting proper amounts of sleep each day.  Administrations This Visit    diphenhydrAMINE (BENADRYL) injection 50 mg    Admin Date 11/20/2018 Action Given Dose 50 mg Route Intravenous Administered By Clearnce SorrelPickard, Jill S, RMA       ketorolac (TORADOL) injection 60 mg    Admin Date 11/20/2018 Action Given Dose 60 mg Route Intramuscular Administered By Clearnce SorrelPickard, Jill S, RMA       promethazine (PHENERGAN) injection 25 mg    Admin Date 11/20/2018 Action Given Dose 25 mg Route Intramuscular Administered By Clearnce SorrelPickard, Jill S, RMA         Advised to call office or go to ER right away if any alarm symptoms occur such as loss of visual field, facial extremity weakness, speech difficulty, sharp/thunderclap pain in head.  Patient will otherwise keep regularly scheduled follow-up with PCP as planned and she will return to clinic sooner if any issues arise.

## 2018-11-28 ENCOUNTER — Other Ambulatory Visit (INDEPENDENT_AMBULATORY_CARE_PROVIDER_SITE_OTHER): Payer: Self-pay | Admitting: Rheumatology

## 2018-12-02 ENCOUNTER — Telehealth: Payer: Self-pay | Admitting: Rheumatology

## 2018-12-02 NOTE — Progress Notes (Signed)
Virtual Visit via Video Note  I connected with Alejandra Cook on 12/03/18 at  8:45 AM EDT by a video enabled telemedicine application and verified that I am speaking with the correct person using two identifiers.   I discussed the limitations of evaluation and management by telemedicine and the availability of in person appointments. The patient expressed understanding and agreed to proceed. This service was conducted via virtual visit.  Both audio and visual tools were used.  The patient was located at home. I was located in my office.  Consent was obtained prior to the virtual visit and is aware of possible charges through their insurance for this visit.  The patient is an established patient.  Dr. Corliss Skainseveshwar, MD conducted the virtual visit and Sherron Alesaylor Maeby Vankleeck, PA-C acted as scribe during the service.  Office staff helped with scheduling follow up visits after the service was conducted.    CC: Generalized pain   History of Present Illness: Patient is a 38 year old female with a past medical history of fibromyalgia and DDD.  She takes Robaxin 500 mg 1 tablet by mouth at bedtime for muscle spasms. She is taking Gabapentin 300 mg po at bedtime. She has been experiencing increased generalized myalgias and arthralgias.  She is having increased trochanteric bursa pain and lower back pain.  She has been more sedentary due to working from home.  She has been sitting for prolonged periods of time which his worsening her trochanteric bursitis.  She has been performing IT band stretching.  She has been trying to walk daily for exercise and does yoga/aerboics 2-3 times per week.    Review of Systems  Constitutional: Negative for fever and malaise/fatigue.  HENT:       +Dry mouth  Eyes: Negative for photophobia, pain, discharge and redness.  Respiratory: Negative for cough, shortness of breath and wheezing.   Cardiovascular: Negative for chest pain and palpitations.  Gastrointestinal: Negative for blood in  stool, constipation and diarrhea.  Genitourinary: Negative for dysuria.  Musculoskeletal: Positive for back pain and myalgias. Negative for joint pain and neck pain.  Skin: Negative for rash.  Neurological: Negative for dizziness and headaches.  Psychiatric/Behavioral: Negative for depression. The patient is nervous/anxious. The patient does not have insomnia.      Observations/Objective: Physical Exam  Constitutional: She is oriented to person, place, and time and well-developed, well-nourished, and in no distress.  HENT:  Head: Normocephalic and atraumatic.  Eyes: Conjunctivae are normal.  Pulmonary/Chest: Effort normal.  Neurological: She is alert and oriented to person, place, and time.  Psychiatric: Mood, memory, affect and judgment normal.    Patient reports morning stiffness for 0  minutes.   Patient reports nocturnal pain.  Difficulty dressing/grooming: Denies Difficulty climbing stairs: Denies Difficulty getting out of chair: Denies Difficulty using hands for taps, buttons, cutlery, and/or writing: Denies  Assessment and Plan: Fibromyalgia: She has been experiencing increased generalized arthralgias and myalgias over the past 1 month. She has been more sedentary and sitting for prolong periods of time due to transitioning to working from home.  She takes Robaxin as needed for muscle spasms as well as gabapentin 300 mg at bedtime which has been helping with her pain. She is have increased pain of bilateral trochanteric bursa and increased lower back pain.  She has been performing IT band exercises on a daily basis and trying to do yoga 2-3 times per week.  She has been walking daily for exercise.  She has intermittent fatigue and  experiences occasional nocturnal pain. The importance of regular exercise and good sleep hygiene was discussed.  We discussed applying voltaren gel to both trochanteric bursa for pain relief.  We reviewed potential side effects and administration  instructions.  A prescription for voltaren gel and a refill of Robaxin was sent to the pharmacy.  She will follow up in 4-5 months.   Other fatigue: Chronic and related to insomnia.    Primary insomnia: She has occasional nocturnal pain.  She takes Gabapentin 300 mg po at bedtime.      Lateral epicondylitis of both elbows:Resolved.   DDD (degenerative disc disease), lumbar: She has been experiencing increased lower back pain.  She has no symptoms of radiculopathy.  She has been more sedentary recently due to having to work from home, which she feels is contributing to the worsening lower back pain.  She has been performing yoga 2-3 times per week. We encouraged her to stay active and stretch on a daily basis.    Trochanteric bursitis of both hips: She is having worsening trochanteric bursitis bilaterally. We discussed the importance of performing IT band stretches daily.  She declined scheduling cortisone injections at this time.  We discussed applying voltaren to help with alleviating her pain.  A prescription for voltaren gel was sent to the pharmacy.    Follow Up Instructions: She will follow up in 4-5 months.  A prescription for voltaren gel was sent to the pharmacy.   I discussed the assessment and treatment plan with the patient. The patient was provided an opportunity to ask questions and all were answered. The patient agreed with the plan and demonstrated an understanding of the instructions.   The patient was advised to call back or seek an in-person evaluation if the symptoms worsen or if the condition fails to improve as anticipated.  I provided 25 minutes of non-face-to-face time during this encounter. Pollyann Savoy, MD   Scribed by- Gearldine Bienenstock, PA-C

## 2018-12-02 NOTE — Telephone Encounter (Signed)
Patient left a voicemail requesting a return call regarding her prescription refill and if it is necessary for her to schedule an appointment before it can be refilled.

## 2018-12-02 NOTE — Telephone Encounter (Signed)
Patient has been scheduled for a virtual visit tomorrow 12/03/2018 with Dr. Corliss Skains.

## 2018-12-03 ENCOUNTER — Encounter: Payer: Self-pay | Admitting: Rheumatology

## 2018-12-03 ENCOUNTER — Telehealth: Payer: Self-pay | Admitting: *Deleted

## 2018-12-03 ENCOUNTER — Other Ambulatory Visit: Payer: Self-pay

## 2018-12-03 ENCOUNTER — Telehealth (INDEPENDENT_AMBULATORY_CARE_PROVIDER_SITE_OTHER): Payer: BC Managed Care – PPO | Admitting: Rheumatology

## 2018-12-03 ENCOUNTER — Other Ambulatory Visit: Payer: Self-pay | Admitting: Physician Assistant

## 2018-12-03 DIAGNOSIS — M797 Fibromyalgia: Secondary | ICD-10-CM

## 2018-12-03 DIAGNOSIS — M7061 Trochanteric bursitis, right hip: Secondary | ICD-10-CM

## 2018-12-03 DIAGNOSIS — M7062 Trochanteric bursitis, left hip: Secondary | ICD-10-CM

## 2018-12-03 DIAGNOSIS — Z8719 Personal history of other diseases of the digestive system: Secondary | ICD-10-CM

## 2018-12-03 DIAGNOSIS — Z8669 Personal history of other diseases of the nervous system and sense organs: Secondary | ICD-10-CM

## 2018-12-03 DIAGNOSIS — R5383 Other fatigue: Secondary | ICD-10-CM

## 2018-12-03 DIAGNOSIS — Z87442 Personal history of urinary calculi: Secondary | ICD-10-CM

## 2018-12-03 DIAGNOSIS — M7711 Lateral epicondylitis, right elbow: Secondary | ICD-10-CM

## 2018-12-03 DIAGNOSIS — M5136 Other intervertebral disc degeneration, lumbar region: Secondary | ICD-10-CM

## 2018-12-03 DIAGNOSIS — M7712 Lateral epicondylitis, left elbow: Secondary | ICD-10-CM

## 2018-12-03 DIAGNOSIS — F5101 Primary insomnia: Secondary | ICD-10-CM

## 2018-12-03 MED ORDER — DICLOFENAC SODIUM 1 % TD GEL: TRANSDERMAL | 2 refills | Status: DC

## 2018-12-03 MED ORDER — METHOCARBAMOL 500 MG PO TABS: 500.0000 mg | ORAL_TABLET | Freq: Every day | ORAL | 0 refills | Status: DC

## 2018-12-03 NOTE — Telephone Encounter (Signed)
Please check. This rx was sent this AM.

## 2018-12-03 NOTE — Telephone Encounter (Signed)
Received a Prior Authorization request from [facility] for Voltaren Gel . Authorization has been submitted to patient's insurance via Cover My Meds. Will update once we receive a response. 

## 2018-12-16 ENCOUNTER — Other Ambulatory Visit: Payer: BC Managed Care – PPO

## 2018-12-18 ENCOUNTER — Ambulatory Visit
Admission: RE | Admit: 2018-12-18 | Discharge: 2018-12-18 | Disposition: A | Discharge status: Home / Self Care | Payer: BC Managed Care – PPO | Source: Ambulatory Visit | Attending: Obstetrics and Gynecology | Admitting: Obstetrics and Gynecology

## 2018-12-18 ENCOUNTER — Other Ambulatory Visit: Payer: Self-pay

## 2018-12-18 ENCOUNTER — Other Ambulatory Visit: Payer: Self-pay | Admitting: Obstetrics and Gynecology

## 2018-12-18 DIAGNOSIS — N6009 Solitary cyst of unspecified breast: Secondary | ICD-10-CM

## 2018-12-18 DIAGNOSIS — N6320 Unspecified lump in the left breast, unspecified quadrant: Secondary | ICD-10-CM

## 2018-12-18 DIAGNOSIS — Z1231 Encounter for screening mammogram for malignant neoplasm of breast: Secondary | ICD-10-CM

## 2018-12-18 DIAGNOSIS — N6310 Unspecified lump in the right breast, unspecified quadrant: Secondary | ICD-10-CM

## 2018-12-26 ENCOUNTER — Other Ambulatory Visit: Payer: Self-pay | Admitting: Family

## 2018-12-26 DIAGNOSIS — M545 Low back pain, unspecified: Secondary | ICD-10-CM

## 2019-01-15 ENCOUNTER — Other Ambulatory Visit: Payer: Self-pay | Admitting: Obstetrics and Gynecology

## 2019-01-15 DIAGNOSIS — N63 Unspecified lump in unspecified breast: Secondary | ICD-10-CM

## 2019-02-01 ENCOUNTER — Ambulatory Visit (INDEPENDENT_AMBULATORY_CARE_PROVIDER_SITE_OTHER)
Admission: RE | Admit: 2019-02-01 | Discharge: 2019-02-01 | Disposition: A | Discharge status: Home / Self Care | Payer: BC Managed Care – PPO | Source: Ambulatory Visit

## 2019-02-01 DIAGNOSIS — M79662 Pain in left lower leg: Secondary | ICD-10-CM

## 2019-02-01 MED ORDER — NAPROXEN 500 MG PO TABS: 500.0000 mg | ORAL_TABLET | Freq: Two times a day (BID) | ORAL | 0 refills | Status: DC

## 2019-02-01 NOTE — Discharge Instructions (Signed)
Naprosyn twice daily for the next week ACE wrap for extra compression and support Elevate calf and rest as much as possible  Follow up in person if symptoms persisting, worsening or developing any redness or swelling

## 2019-02-01 NOTE — ED Provider Notes (Signed)
Virtual Visit via Video Note:  Alejandra Cook  initiated request for Telemedicine visit with [facility] team. I connected with Alejandra Cook  on 02/01/2019 at 12:12 PM  for a synchronized telemedicine visit using a video enabled HIPPA compliant telemedicine application. I verified that I am speaking with Alejandra Cook  using two identifiers.  C , PA-C  was physically located in a [facility] site and Alejandra Cook was located at a different location.   The limitations of evaluation and management by telemedicine as well as the availability of in-person appointments were discussed. Patient was informed that she  may incur a bill ( including co-pay) for this virtual visit encounter. Alejandra Cook  expressed understanding and gave verbal consent to proceed with virtual visit.     History of Present Illness:Alejandra Cook  is a 38 y.o. female presents with concern over persistent calf pain.  Patient states that she has had Pain in her right calf over the past 1.5 weeks.  She denies any injury, increase in activity of recently.  States that it feels as a sore muscle, but is concerned that her symptoms have persisted.  She is tried taking ibuprofen without relief.  She is also tried gentle stretching.  She denies any swelling or overlying changes to the color of her skin.  Denies erythema.  Denies previous DVT/PE.  Denies smoking history.  Denies recent travel, immobilization, hospitalization or surgery.  Is on oral contraceptives.  Past Medical History:  Diagnosis Date  . Allergy    Dr. Gwen PoundsKowalski, gold and nickel allergy  . Anxiety   . Bulging discs   . Degenerative disc disease, lumbar   . Depression    off of zoloft for 1 year, had problems with depression and anxiety after last child  . Fibromyalgia    after pregnancy, Followed by [facility]  . Fibromyalgia   . ZOXWRUEA(540.9Headache(784.0)    Dr. Neale [facility]lyFreeman at [facility]; migraines since  elementary   . IBS (irritable bowel syndrome)   . 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 Spontaneous abortion in second trimester Dec 2014    No Known Allergies      Observations/Objective: Physical Exam  Constitutional: She is oriented to person, place, and time and well-developed, well-nourished, and in no distress. No distress.  HENT:  Head: Normocephalic and atraumatic.  Neck: Normal range of motion.  Pulmonary/Chest: Effort normal. No respiratory distress.  Speaking in full sentences  Musculoskeletal:     Comments: Moving extremities appropriately, due to visual quality difficult to ascertain appearance of calf  Neurological: She is alert and oriented to person, place, and time.    Assessment and Plan: Patient with calf pain x1.5 weeks.  No associated erythema or edema.  Only risk factor for DVT is use of oral contraceptives which she has been on for the past year.  Based off description, most likely more muscular versus tendinitis.  Will recommend trying alternative NSAID Naprosyn 500 mg twice daily.  Continue to stretch.  Advised to follow-up if developing any redness or swelling, also to follow-up in person for potential rule out DVT if symptoms persisting.Discussed strict return precautions. Patient verbalized understanding and is agreeable with plan.   Follow Up Instructions:    I discussed the assessment and treatment plan with the patient. The patient was provided an opportunity to ask questions and all were answered. The patient agreed with the plan and demonstrated  an understanding of the instructions.   The patient was advised to call back or seek an in-person evaluation if the symptoms worsen or if the condition fails to improve as anticipated.      Janith Lima, PA-C  02/01/2019 12:12 PM        Janith Lima, PA-C 02/01/19 2007

## 2019-02-03 ENCOUNTER — Other Ambulatory Visit: Payer: Self-pay

## 2019-02-04 ENCOUNTER — Encounter: Payer: Self-pay | Admitting: Family Medicine

## 2019-02-04 ENCOUNTER — Other Ambulatory Visit: Payer: Self-pay

## 2019-02-04 ENCOUNTER — Ambulatory Visit: Payer: BC Managed Care – PPO | Admitting: Family Medicine

## 2019-02-04 ENCOUNTER — Ambulatory Visit
Admission: RE | Admit: 2019-02-04 | Discharge: 2019-02-04 | Disposition: A | Discharge status: Home / Self Care | Payer: BC Managed Care – PPO | Source: Ambulatory Visit | Attending: Family Medicine | Admitting: Family Medicine

## 2019-02-04 VITALS — BP 110/82 | HR 99 | Temp 98.2°F | Resp 16 | Ht 63.0 in | Wt 139.4 lb

## 2019-02-04 DIAGNOSIS — M79662 Pain in left lower leg: Secondary | ICD-10-CM | POA: Insufficient documentation

## 2019-02-04 MED ORDER — CYCLOBENZAPRINE HCL 5 MG PO TABS: 5.0000 mg | ORAL_TABLET | Freq: Three times a day (TID) | ORAL | 1 refills | Status: DC | PRN

## 2019-02-04 NOTE — Progress Notes (Signed)
Subjective:    Patient ID: Alejandra Cook, female    DOB: August 23, 1980, 38 y.o.   MRN: [id number]  HPI   Patient presents to clinic due to left calf pain for 2 weeks.  There is no redness or swelling of the left leg.  Patient denies any recent travel in an airplane or any long car rides.  Denies any known history of blood clotting disorders in herself or within the family.  Patient does use oral contraceptives.  Patient did a virtual visit on 02/01/2019 and was prescribed naproxen twice daily and stretches and advised if pain persisted to follow-up.  Patient states she has been using the naproxen doing her stretches, but pain in left calf persists.  States it is painful to walk.  Patient Active Problem List   Diagnosis Date Noted  . Polycythemia 12/01/2017  . History of migraine 12/20/2016  . Other fatigue 12/20/2016  . Primary insomnia 12/20/2016  . History of IBS 12/20/2016  . History of renal calculi 12/20/2016  . History of sinusitis 12/20/2016  . Bug bites 01/23/2016  . Bronchitis 08/11/2014  . Routine general medical examination at a health care facility 01/21/2013  . Fibromyalgia 01/17/2012  . Allergic rhinitis 01/17/2012  . Migraine headache 05/12/2007  . DEGENERATIVE DISC DISEASE, LUMBAR SPINE 05/12/2007   Social History   Tobacco Use  . Smoking status: Never Smoker  . Smokeless tobacco: Never Used  Substance Use Topics  . Alcohol use: No    Alcohol/week: 0.0 standard drinks   Review of Systems  Constitutional: Negative for chills, fatigue and fever.  HENT: Negative for congestion, ear pain, sinus pain and sore throat.   Eyes: Negative.   Respiratory: Negative for cough, shortness of breath and wheezing.   Cardiovascular: Negative for chest pain, palpitations and leg swelling.  Gastrointestinal: Negative for abdominal pain, diarrhea, nausea and vomiting.  Genitourinary: Negative for dysuria, frequency and urgency.  Musculoskeletal: +left calf pain Skin:  Negative for color change, pallor and rash.  Neurological: Negative for syncope, light-headedness and headaches.  Psychiatric/Behavioral: The patient is not nervous/anxious.       Objective:   Physical Exam Vitals signs and nursing note reviewed.  Constitutional:      General: She is not in acute distress.    Appearance: She is not ill-appearing, toxic-appearing or diaphoretic.  HENT:     Head: Normocephalic and atraumatic.  Eyes:     General: No scleral icterus.    Extraocular Movements: Extraocular movements intact.     Conjunctiva/sclera: Conjunctivae normal.  Neck:     Musculoskeletal: Normal range of motion and neck supple. No neck rigidity.  Cardiovascular:     Rate and Rhythm: Normal rate and regular rhythm.     Pulses: Normal pulses.     Heart sounds: Normal heart sounds.     Comments: +2 pedal pulse bilat Pulmonary:     Effort: Pulmonary effort is normal. No respiratory distress.     Breath sounds: Normal breath sounds. No stridor. No wheezing, rhonchi or rales.  Musculoskeletal: Normal range of motion.        General: Tenderness (left calf pain) present.     Right lower leg: No edema.     Left lower leg: No edema.  Skin:    General: Skin is warm and dry.     Coloration: Skin is not jaundiced or pale.     Findings: No bruising, erythema or rash.  Neurological:     General: No focal deficit present.  Mental Status: She is alert and oriented to person, place, and time.     Gait: Gait (no limping with walking) normal.  Psychiatric:        Mood and Affect: Mood normal.        Behavior: Behavior normal.    Today's Vitals   02/04/19 1108  BP: 110/82  Pulse: 99  Resp: 16  Temp: 98.2 F (36.8 C)  TempSrc: Oral  SpO2: 98%  Weight: 139 lb 6.4 oz (63.2 kg)  Height: 5\' 3"  (1.6 m)   Body mass index is 24.69 kg/m.    Assessment & Plan:    Left calf pain -- discussed with patient that her Wells criteria for DVT screening is well, but due to her concern as  the Pain is persisted we will get Doppler to rule out DVT.  Advised that her only risk factor would be oral contraceptive use but otherwise she does not have any redness, swelling or rash long travel that would make me concerned for DVT.  Patient advised to continue naproxen twice daily and also we will trial use of muscle relaxer as needed.   Once we have Doppler results, we will better be able to know next step in plan of care.  I still have strong suspicion for a muscular pain and/or tendinitis pain causing the pain in calf.

## 2019-02-04 NOTE — Patient Instructions (Signed)
Continue naproxen and stretching  Use muscle relaxant as needed, this med can cause drowsiness

## 2019-02-05 ENCOUNTER — Telehealth: Payer: Self-pay

## 2019-02-05 NOTE — Telephone Encounter (Signed)
Copied from [place] [id number]. Topic: General - Other >> Feb 04, 2019  4:22 PM Virl Axe D wrote: Reason for CRM: [facility] Ultrasound stated pt's left leg ultrasound was negative for DBT. Office closed

## 2019-02-05 NOTE — Telephone Encounter (Signed)
See result note. Already addressed.

## 2019-02-05 NOTE — Telephone Encounter (Signed)
Sending your way since you saw her :)

## 2019-02-19 ENCOUNTER — Other Ambulatory Visit: Payer: Self-pay | Admitting: Physician Assistant

## 2019-02-19 NOTE — Telephone Encounter (Signed)
Last Visit: 12/03/18  Next Visit: 03/17/19  Okay to refill per Dr. Estanislado Pandy

## 2019-02-25 ENCOUNTER — Other Ambulatory Visit: Payer: Self-pay | Admitting: Family

## 2019-02-25 DIAGNOSIS — M545 Low back pain, unspecified: Secondary | ICD-10-CM

## 2019-02-25 NOTE — Telephone Encounter (Signed)
Call patient Please inform her that I just refilled her meloxicam.  I have not seen patient in some time and actually not sure why she takes meloxicam.  I certainly would advise that she not take a prescription anti-inflammatory daily.  I know she has been seeing my colleague Cereniti.   Advise mobic only prn  Ensure she takes medication with food as well

## 2019-02-27 NOTE — Telephone Encounter (Signed)
Removed mobic from med list

## 2019-02-27 NOTE — Telephone Encounter (Signed)
Patient stated that she has taken this when she fell in December. She said that pharmacy has this on automatic refill & she asked them to stop sending request. She no longer takes.

## 2019-03-02 NOTE — Progress Notes (Deleted)
Office Visit Note  Patient: Alejandra Cook             Date of Birth: 12-Jul-1981           MRN: [id number]             PCP: Allegra GranaArnett, Margaret G, FNP Referring: Allegra GranaArnett, Margaret G, FNP Visit Date: 03/16/2019 Occupation: @GUAROCC @  Subjective:    History of Present Illness: Alejandra BigLauren Hodge Cook is a 38 y.o. female with history of fibromyalgia and DDD. She takes Robaxin 500 mg 1 tablet by mouth at bedtime for muscle spasms.   Activities of Daily Living:  Patient reports morning stiffness for  {minute/hour:19697}.   Patient {ACTIONS;DENIES/REPORTS:21021675::"Denies"} nocturnal pain.  Difficulty dressing/grooming: {ACTIONS;DENIES/REPORTS:21021675::"Denies"} Difficulty climbing stairs: {ACTIONS;DENIES/REPORTS:21021675::"Denies"} Difficulty getting out of chair: {ACTIONS;DENIES/REPORTS:21021675::"Denies"} Difficulty using hands for taps, buttons, cutlery, and/or writing: {ACTIONS;DENIES/REPORTS:21021675::"Denies"}  Review of Systems  Constitutional: Negative for fatigue.  HENT: Negative for mouth sores, mouth dryness and nose dryness.   Eyes: Negative for pain, visual disturbance and dryness.  Respiratory: Negative for cough, hemoptysis, shortness of breath and difficulty breathing.   Cardiovascular: Negative for chest pain, palpitations, hypertension and swelling in legs/feet.  Gastrointestinal: Negative for blood in stool, constipation and diarrhea.  Endocrine: Negative for increased urination.  Genitourinary: Negative for painful urination.  Musculoskeletal: Negative for arthralgias, joint pain, joint swelling, myalgias, muscle weakness, morning stiffness, muscle tenderness and myalgias.  Skin: Negative for color change, pallor, rash, hair loss, nodules/bumps, skin tightness, ulcers and sensitivity to sunlight.  Allergic/Immunologic: Negative for susceptible to infections.  Neurological: Negative for dizziness, numbness, headaches and weakness.  Hematological: Negative for swollen  glands.  Psychiatric/Behavioral: Negative for depressed mood and sleep disturbance. The patient is not nervous/anxious.     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 Gwen PoundsKowalski, gold and nickel allergy   Anxiety    Bulging discs    Degenerative disc disease, lumbar    Depression    off of zoloft for 1 year, had problems with depression and anxiety after last child   Fibromyalgia    after pregnancy, Followed by [facility]   Fibromyalgia    Headache(784.0)    Dr. [facility]Freeman at [facility]; migraines since elementary    IBS (irritable bowel syndrome)    Intrahepatic cholestasis of pregnancy in third trimester, antepartum    Spontaneous abortion in second trimester Dec 2014    Family History  Problem Relation Age of Onset   Healthy Mother    Hypertension Father    Cancer Maternal Grandmother        cancer, breast   Cancer Paternal Grandmother        breast   Healthy Sister    Hypertension Paternal Grandfather    Healthy Daughter    Healthy Son    Past Surgical History:  Procedure Laterality Date   COLONOSCOPY     normal   COLPOSCOPY  2002  CYSTOSCOPY     ureteral stent inserted   DILATION AND EVACUATION N/A 08/07/2013   Procedure: DILATATION AND EVACUATION;  Surgeon: Zelphia CairoGretchen Adkins, MD;  Location: [facility];  Service: Gynecology;  Laterality: N/A;   LAPAROSCOPY N/A  03/20/2013   Procedure: LAPAROSCOPY OPERATIVE  IUD REMOVAL;  Surgeon: Zelphia CairoGretchen Adkins, MD;  Location: [facility];  Service: Gynecology;  Laterality: N/A;  Intraabdominal from [place]   NASAL SINUS SURGERY     PILONIDAL CYST EXCISION     VAGINAL DELIVERY  2011   Social History   Social History Narrative   Lives in [place]. 2YO daughter.      Works- 10[employer] and 11[employer]; [employer]      Immunization History  Administered Date(s) Administered   Hepatitis B 09/20/1997, 10/18/1997, 03/22/1998   Hepatitis B, adult 04/30/2014, 06/01/2014, 11/02/2014   Influenza Split 08/06/2013   Influenza,inj,Quad PF,6+ Mos 04/28/2014   Influenza-Unspecified 05/23/2012, 06/06/2017, 06/07/2018   PPD Test 04/28/2014   Td 10/05/1995   Tdap 01/16/2010     Objective: Vital Signs: There were no vitals taken for this visit.   Physical Exam Vitals signs and nursing note reviewed.  Constitutional:      Appearance: She is well-developed.  HENT:     Head: Normocephalic and atraumatic.  Eyes:     Conjunctiva/sclera: Conjunctivae normal.  Neck:     Musculoskeletal: Normal range of motion.  Cardiovascular:     Rate and Rhythm: Normal rate and regular rhythm.     Heart sounds: Normal heart sounds.  Pulmonary:     Effort: Pulmonary effort is normal.     Breath sounds: Normal breath sounds.  Abdominal:     General: Bowel sounds are normal.     Palpations: Abdomen is soft.  Lymphadenopathy:     Cervical: No cervical adenopathy.  Skin:    General: Skin is warm and dry.     Capillary Refill: Capillary refill takes less than 2 seconds.  Neurological:     Mental Status: She is alert and oriented to person, place, and time.  Psychiatric:        Behavior: Behavior normal.      Musculoskeletal Exam: ***  CDAI Exam: CDAI Score: -- Patient Global: --; Provider Global: -- Swollen: --; Tender: -- Joint Exam   No joint exam has been documented for this visit   There is currently no information documented  on the homunculus. Go to the Rheumatology activity and complete the homunculus joint exam.  Investigation: No additional findings.  Imaging: [place] Venous Img Lower Unilateral Left  Result Date: 02/04/2019 CLINICAL DATA:  Left calf pain for 2 weeks EXAM: LEFT LOWER EXTREMITY VENOUS DOPPLER ULTRASOUND TECHNIQUE: Gray-scale sonography with graded compression, as well as color Doppler and duplex ultrasound were performed to evaluate the lower extremity deep venous systems from the level of the common femoral vein and including the common femoral, femoral, profunda femoral, popliteal and calf veins including the posterior tibial, peroneal and gastrocnemius veins when visible. The superficial great saphenous vein was also interrogated. Spectral Doppler was utilized to evaluate flow at rest and with distal augmentation maneuvers in the common femoral, femoral and popliteal veins. COMPARISON:  None. FINDINGS: Contralateral Common Femoral Vein: Respiratory phasicity is normal and symmetric with the symptomatic side. No evidence of thrombus. Normal compressibility. Common Femoral Vein: No evidence of thrombus. Normal compressibility, respiratory phasicity and response to augmentation. Saphenofemoral Junction: No evidence of thrombus. Normal compressibility and flow on color Doppler imaging. Profunda Femoral Vein: No evidence of thrombus.  Normal compressibility and flow on color Doppler imaging. Femoral Vein: No evidence of thrombus. Normal compressibility, respiratory phasicity and response to augmentation. Popliteal Vein: No evidence of thrombus. Normal compressibility, respiratory phasicity and response to augmentation. Calf Veins: No evidence of thrombus. Normal compressibility and flow on color Doppler imaging. Superficial Great Saphenous Vein: No evidence of thrombus. Normal compressibility. Venous Reflux:  Not assessed Other Findings:  None. IMPRESSION: No evidence of deep venous thrombosis. Electronically Signed    By: Jerilynn Mages.  Shick M.D.   On: 02/04/2019 16:15    Recent Labs: Lab Results  Component Value Date   WBC 7.2 01/23/2018   HGB 15.1 01/23/2018   PLT 247 01/23/2018   NA 139 07/31/2017   K 4.0 07/31/2017   CL 109 07/31/2017   CO2 22 07/31/2017   GLUCOSE 88 07/31/2017   BUN 13 07/31/2017   CREATININE 1.02 07/31/2017   BILITOT 0.6 07/31/2017   ALKPHOS 50 07/31/2017   AST 15 07/31/2017   ALT 10 07/31/2017   PROT 6.9 07/31/2017   ALBUMIN 4.6 07/31/2017   CALCIUM 8.9 07/31/2017   GFRAA >60 10/31/2015    Speciality Comments: No specialty comments available.  Procedures:  No procedures performed Allergies: Patient has no known allergies.   Assessment / Plan:     Visit Diagnoses: No diagnosis found.  Orders: No orders of the defined types were placed in this encounter.  No orders of the defined types were placed in this encounter.   Face-to-face time spent with patient was *** minutes. Greater than 50% of time was spent in counseling and coordination of care.  Follow-Up Instructions: No follow-ups on file.   Ofilia Neas, PA-C  Note - This record has been created using Dragon software.  Chart creation errors have been sought, but may not always  have been located. Such creation errors do not reflect on  the standard of medical care.

## 2019-03-16 ENCOUNTER — Ambulatory Visit: Payer: BC Managed Care – PPO | Admitting: Physician Assistant

## 2019-03-17 ENCOUNTER — Ambulatory Visit: Payer: BC Managed Care – PPO | Admitting: Physician Assistant

## 2019-04-16 ENCOUNTER — Other Ambulatory Visit: Payer: Self-pay

## 2019-04-16 ENCOUNTER — Telehealth: Payer: BC Managed Care – PPO

## 2019-04-17 ENCOUNTER — Ambulatory Visit: Payer: BC Managed Care – PPO | Admitting: Family Medicine

## 2019-04-17 ENCOUNTER — Other Ambulatory Visit: Payer: Self-pay

## 2019-04-17 VITALS — BP 108/64 | HR 94 | Temp 97.0°F | Resp 16 | Ht 63.0 in | Wt 140.0 lb

## 2019-04-17 DIAGNOSIS — N3000 Acute cystitis without hematuria: Secondary | ICD-10-CM

## 2019-04-17 LAB — POCT URINALYSIS DIPSTICK
Bilirubin, UA: NEGATIVE
Blood, UA: NEGATIVE
Glucose, UA: NEGATIVE
Ketones, UA: 5
Nitrite, UA: NEGATIVE
Protein, UA: POSITIVE — AB
Spec Grav, UA: 1.02 (ref 1.010–1.025)
Urobilinogen, UA: 1 E.U./dL
pH, UA: 6.5 (ref 5.0–8.0)

## 2019-04-17 MED ORDER — SULFAMETHOXAZOLE-TRIMETHOPRIM 800-160 MG PO TABS: 1.0000 | ORAL_TABLET | Freq: Two times a day (BID) | ORAL | 0 refills | Status: DC

## 2019-04-17 NOTE — Progress Notes (Signed)
Subjective:    Patient ID: Alejandra Cook, female    DOB: [birth year], 38 y.o.   MRN: [id number]  HPI   Patient presents to clinic complaining of increased urinary frequency, pressure with urination and burning with urination for 2 to 3 days.  Did take a dose of Azo yesterday, but has not taken any today.  No fever or chills.  No vomiting or diarrhea.  Patient Active Problem List   Diagnosis Date Noted  . Polycythemia 12/01/2017  . History of migraine 12/20/2016  . Other fatigue 12/20/2016  . Primary insomnia 12/20/2016  . History of IBS 12/20/2016  . History of renal calculi 12/20/2016  . History of sinusitis 12/20/2016  . Bug bites 01/23/2016  . Bronchitis 08/11/2014  . Routine general medical examination at a health care facility 01/21/2013  . Fibromyalgia 01/17/2012  . Allergic rhinitis 01/17/2012  . Migraine headache 05/12/2007  . DEGENERATIVE DISC DISEASE, LUMBAR SPINE 05/12/2007   Social History   Tobacco Use  . Smoking status: Never Smoker  . Smokeless tobacco: Never Used  Substance Use Topics  . Alcohol use: No    Alcohol/week: 0.0 standard drinks   Review of Systems  Constitutional: Negative for chills, fatigue and fever.  HENT: Negative for congestion, ear pain, sinus pain and sore throat.   Eyes: Negative.   Respiratory: Negative for cough, shortness of breath and wheezing.   Cardiovascular: Negative for chest pain, palpitations and leg swelling.  Gastrointestinal: Negative for abdominal pain, diarrhea, nausea and vomiting.  Genitourinary: +dysuria, frequency and urgency.  Musculoskeletal: Negative for arthralgias and myalgias.  Skin: Negative for color change, pallor and rash.  Neurological: Negative for syncope, light-headedness and headaches.  Psychiatric/Behavioral: The patient is not nervous/anxious.       Objective:   Physical Exam Vitals signs and nursing note reviewed.  Constitutional:      General: She is not in acute distress.  Appearance: She is well-developed. She is not toxic-appearing.  HENT:     Head: Normocephalic and atraumatic.  Eyes:     General: No scleral icterus.    Extraocular Movements: Extraocular movements intact.     Conjunctiva/sclera: Conjunctivae normal.  Neck:     Musculoskeletal: Neck supple.     Trachea: No tracheal deviation.  Cardiovascular:     Rate and Rhythm: Normal rate and regular rhythm.     Heart sounds: Normal heart sounds.  Pulmonary:     Effort: Pulmonary effort is normal. No respiratory distress.     Breath sounds: Normal breath sounds.  Abdominal:     General: Bowel sounds are normal. There is no distension.     Palpations: Abdomen is soft. There is no mass.     Tenderness: There is abdominal tenderness (mild suprapubic tenderness). There is no right CVA tenderness, left CVA tenderness, guarding or rebound.  Skin:    General: Skin is warm and dry.     Coloration: Skin is not jaundiced or pale.  Neurological:     Mental Status: She is alert and oriented to person, place, and time.     Gait: Gait normal.  Psychiatric:        Mood and Affect: Mood normal.        Behavior: Behavior normal.    Today's Vitals   04/17/19 1108  BP: 108/64  Pulse: 94  Resp: 16  Temp: (!) 97 F (36.1 C)  TempSrc: Temporal  SpO2: 98%  Weight: 140 lb (63.5 kg)  Height: 5\' 3"  (1.6 m)  Body mass index is 24.8 kg/m.     Assessment & Plan:    UTI-patient symptoms, and leukocytes and urinalysis do seem consistent with a urinary tract infection.  We will send urine culture to lab for confirmation.  Advised to increase water intake, avoid excess sugary and caffeinated beverages and take antibiotic as prescribed.  Patient will keep all regular follow-ups with PCP as planned and let us know if symptoms are not improving as expected.

## 2019-04-18 LAB — URINE CULTURE
MICRO NUMBER:: 871434
SPECIMEN QUALITY:: ADEQUATE

## 2019-07-01 ENCOUNTER — Other Ambulatory Visit: Payer: BC Managed Care – PPO

## 2019-08-12 ENCOUNTER — Other Ambulatory Visit: Payer: Self-pay

## 2019-08-12 ENCOUNTER — Ambulatory Visit
Admission: RE | Admit: 2019-08-12 | Discharge: 2019-08-12 | Disposition: A | Discharge status: Home / Self Care | Payer: BC Managed Care – PPO | Source: Ambulatory Visit | Attending: Obstetrics and Gynecology | Admitting: Obstetrics and Gynecology

## 2019-08-12 DIAGNOSIS — N63 Unspecified lump in unspecified breast: Secondary | ICD-10-CM

## 2019-08-12 DIAGNOSIS — N6320 Unspecified lump in the left breast, unspecified quadrant: Secondary | ICD-10-CM

## 2019-08-12 DIAGNOSIS — N6310 Unspecified lump in the right breast, unspecified quadrant: Secondary | ICD-10-CM

## 2019-08-19 ENCOUNTER — Other Ambulatory Visit: Payer: Self-pay | Admitting: Rheumatology

## 2019-08-20 NOTE — Telephone Encounter (Signed)
Last Visit: 12/03/2018 telemedicine  Next Visit: message sent to the front desk to schedule.   Last fill: 02/19/2019 (30 tablets)  Okay to refill robaxin?

## 2019-08-20 NOTE — Telephone Encounter (Signed)
ok 

## 2019-08-31 ENCOUNTER — Telehealth: Payer: Self-pay | Admitting: Rheumatology

## 2019-08-31 NOTE — Telephone Encounter (Signed)
I LMOM for patient to call, and schedule a follow up appointment. Patient is over due.

## 2019-08-31 NOTE — Telephone Encounter (Signed)
-----   Message from Ellen Henri, CMA sent at 08/20/2019  9:03 AM EST ----- Patient is due for a follow up appointment. Thanks!

## 2019-09-01 ENCOUNTER — Other Ambulatory Visit: Payer: Self-pay | Admitting: Neurology

## 2019-09-01 DIAGNOSIS — G4452 New daily persistent headache (NDPH): Secondary | ICD-10-CM

## 2019-09-09 ENCOUNTER — Ambulatory Visit: Payer: BC Managed Care – PPO

## 2019-09-10 DIAGNOSIS — K589 Irritable bowel syndrome without diarrhea: Secondary | ICD-10-CM | POA: Insufficient documentation

## 2019-09-18 ENCOUNTER — Other Ambulatory Visit: Payer: Self-pay

## 2019-09-18 ENCOUNTER — Ambulatory Visit
Admission: RE | Admit: 2019-09-18 | Discharge: 2019-09-18 | Disposition: A | Discharge status: Home / Self Care | Payer: BC Managed Care – PPO | Source: Ambulatory Visit | Attending: Neurology | Admitting: Neurology

## 2019-09-18 DIAGNOSIS — G4452 New daily persistent headache (NDPH): Secondary | ICD-10-CM | POA: Diagnosis not present

## 2019-09-18 MED ORDER — GADOBUTROL 1 MMOL/ML IV SOLN
6.0000 mL | Freq: Once | INTRAVENOUS | Status: AC | PRN
  Administered 2019-09-18: 6 mL via INTRAVENOUS

## 2019-10-07 ENCOUNTER — Telehealth: Payer: Self-pay | Admitting: Family

## 2019-10-07 ENCOUNTER — Ambulatory Visit
Admission: EM | Admit: 2019-10-07 | Discharge: 2019-10-07 | Disposition: A | Discharge status: Home / Self Care | Payer: BC Managed Care – PPO | Attending: Emergency Medicine | Admitting: Emergency Medicine

## 2019-10-07 ENCOUNTER — Encounter: Payer: Self-pay | Admitting: Family

## 2019-10-07 ENCOUNTER — Other Ambulatory Visit: Payer: Self-pay

## 2019-10-07 DIAGNOSIS — J029 Acute pharyngitis, unspecified: Secondary | ICD-10-CM | POA: Diagnosis not present

## 2019-10-07 LAB — POCT RAPID STREP A (OFFICE): Rapid Strep A Screen: NEGATIVE

## 2019-10-07 MED ORDER — AMOXICILLIN 875 MG PO TABS: 875.0000 mg | ORAL_TABLET | Freq: Two times a day (BID) | ORAL | 0 refills | Status: AC

## 2019-10-07 NOTE — ED Provider Notes (Signed)
[name]    CSN: [id number] Arrival date & time: 10/07/19  1514      History   Chief Complaint Chief Complaint  Patient presents with  . Sore Throat    HPI Alejandra Cook is a 39 y.o. female.   Patient presents with sore throat x2 days.  Her daughter tested positive for strep throat 1 week ago.  She denies fever, chills, rash, difficulty swallowing, arthralgias, cough, shortness of breath, or other symptoms.  Treatment attempted at home with ibuprofen.  The history is provided by the patient.    Past Medical History:  Diagnosis Date  . Allergy    Dr. Gwen Pounds, gold and nickel allergy  . Anxiety   . Bulging discs   . Degenerative disc disease, lumbar   . Depression    off of zoloft for 1 year, had problems with depression and anxiety after last child  . Fibromyalgia    after pregnancy, Followed by Devoshar  . Fibromyalgia   . VPXTGGYI(948.5)    Dr. Neale Burly at [facility]; migraines since elementary   . IBS (irritable bowel syndrome)   . 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 Spontaneous abortion in second trimester Dec 2014    Patient Active Problem List   Diagnosis Date Noted  . Polycythemia 12/01/2017  . History of migraine 12/20/2016  . Other fatigue 12/20/2016  . Primary insomnia 12/20/2016  . History of IBS 12/20/2016  . History of renal calculi 12/20/2016  . History of sinusitis 12/20/2016  . Bug bites 01/23/2016  . Bronchitis 08/11/2014  . Routine general medical examination at a health care facility 01/21/2013  . Fibromyalgia 01/17/2012  . Allergic rhinitis 01/17/2012  . Migraine headache 05/12/2007  . 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 COLONOSCOPY     normal  . COLPOSCOPY  2002  . CYSTOSCOPY     ureteral stent inserted  . DILATION AND EVACUATION N/A 08/07/2013   Procedure: DILATATION AND EVACUATION;  Surgeon:  Zelphia Cairo, MD;  Location: [facility];  Service: Gynecology;  Laterality: N/A;  . LAPAROSCOPY N/A 03/20/2013   Procedure: LAPAROSCOPY OPERATIVE  IUD REMOVAL;  Surgeon: Zelphia Cairo, MD;  Location: [facility];  Service: Gynecology;  Laterality: N/A;  Intraabdominal from [place]  . NASAL SINUS SURGERY    . PILONIDAL CYST EXCISION    . VAGINAL DELIVERY  2011    OB History    Gravida  4   Para  2   Term  1   Preterm  1   AB  2   Living  2     SAB  2   TAB      Ectopic      Multiple  0   Live Births  2            Home Medications    Prior to Admission medications   Medication Sig Start Date End Date Taking? Authorizing Provider  AJOVY 225 MG/1.5ML SOSY  11/23/17  Yes [provider]  cyclobenzaprine (FLEXERIL) 5 MG tablet Take 1 tablet (5 mg total) by mouth 3 (three) times daily as needed for muscle spasms. 02/04/19  Yes Guse, Janna Arch, FNP  diclofenac sodium (VOLTAREN) 1 % GEL Apply 2 grams to 4 grams to affected area up to 4 times daily. 12/03/18  Yes Gearldine Bienenstock, PA-C  gabapentin (NEURONTIN) 300 MG capsule Take 300 mg by mouth  at bedtime.  09/10/17 10/07/19 Yes [provider]  LO LOESTRIN FE 1 MG-10 MCG / 10 MCG tablet Take 1 tablet by mouth daily. as directed 12/19/16  Yes [provider]  methocarbamol (ROBAXIN) 500 MG tablet TAKE 1 TABLET (500 MG TOTAL) BY MOUTH AT BEDTIME. 08/20/19  Yes Deveshwar, Abel Presto, MD  naproxen (NAPROSYN) 500 MG tablet Take 1 tablet (500 mg total) by mouth 2 (two) times daily. 02/01/19  Yes Wieters, Hallie C, PA-C  ondansetron (ZOFRAN) 4 MG tablet TAKE 1 TABLET (4 MG TOTAL) BY MOUTH DAILY AS NEEDED FOR NAUSEA OR VOMITING. 02/15/18  Yes Arnett, Yvetta Coder, FNP  promethazine (PHENERGAN) 12.5 MG tablet Take 1-2 tablets (12.5-25 mg total) by mouth once as needed for up to 1 dose for nausea or vomiting. With migraines. 10/30/17  Yes McLean-Scocuzza, Nino Glow, MD  rizatriptan (MAXALT) 10 MG tablet Take 1 tablet (10 mg total) by  mouth as needed for migraine. May repeat in 2 hours if needed. Max dose 3 pills in 24 hours 07/25/17  Yes McLean-Scocuzza, Nino Glow, MD  sertraline (ZOLOFT) 100 MG tablet Take 100 mg by mouth daily. 11/26/16  Yes [provider]  zonisamide (ZONEGRAN) 100 MG capsule take 2 tabs at hs 12/18/16  Yes [provider]  amoxicillin (AMOXIL) 875 MG tablet Take 1 tablet (875 mg total) by mouth 2 (two) times daily for 7 days. 10/07/19 10/14/19  Sharion Balloon, NP  predniSONE (DELTASONE) 10 MG tablet Take 40 mg by mouth on day 1, then taper 10 mg daily until gone 08/15/18   Burnard Hawthorne, FNP  sulfamethoxazole-trimethoprim (BACTRIM DS) 800-160 MG tablet Take 1 tablet by mouth 2 (two) times daily. 04/17/19   Jodelle Green, FNP    Family History Family History  Problem Relation Age of Onset  . Healthy Mother   . Hypertension Father   . Cancer Maternal Grandmother        cancer, breast  . Cancer Paternal Grandmother        breast  . Healthy Sister   . Hypertension Paternal Grandfather   . Healthy Daughter   . Healthy Son     Social History Social History   Tobacco Use  . Smoking status: Never Smoker  . Smokeless tobacco: Never Used  Substance Use Topics  . Alcohol use: No    Alcohol/week: 0.0 standard drinks  . Drug use: Never     Allergies   Patient has no known allergies.   Review of Systems Review of Systems  Constitutional: Negative for chills and fever.  HENT: Positive for sore throat. Negative for congestion and ear pain.   Eyes: Negative for pain and visual disturbance.  Respiratory: Negative for cough and shortness of breath.   Cardiovascular: Negative for chest pain and palpitations.  Gastrointestinal: Negative for abdominal pain, diarrhea, nausea and vomiting.  Genitourinary: Negative for dysuria and hematuria.  Musculoskeletal: Negative for arthralgias and back pain.  Skin: Negative for color change and rash.  Neurological: Negative for seizures and  syncope.  All other systems reviewed and are negative.    Physical Exam Triage Vital Signs ED Triage Vitals [10/07/19 1528]  Enc Vitals Group     BP      Pulse      Resp      Temp      Temp src      SpO2      Weight 135 lb (61.2 kg)     Height 5\' 3"  (1.6 m)  Head Circumference      Peak Flow      Pain Score 5     Pain Loc      Pain Edu?      Excl. in GC?    No data found.  Updated Vital Signs BP 109/75 (BP Location: Left Arm)   Pulse 97   Temp 98.5 F (36.9 C) (Oral)   Resp 18   Ht 5\' 3"  (1.6 m)   Wt 135 lb (61.2 kg)   LMP 09/23/2019   SpO2 97%   BMI 23.91 kg/m   Visual Acuity Right Eye Distance:   Left Eye Distance:   Bilateral Distance:    Right Eye Near:   Left Eye Near:    Bilateral Near:     Physical Exam Vitals and nursing note reviewed.  Constitutional:      General: She is not in acute distress.    Appearance: She is well-developed.  HENT:     Head: Normocephalic and atraumatic.     Right Ear: Tympanic membrane normal.     Left Ear: Tympanic membrane normal.     Nose: Nose normal.     Mouth/Throat:     Mouth: Mucous membranes are moist.     Pharynx: Posterior oropharyngeal erythema present.  Eyes:     Conjunctiva/sclera: Conjunctivae normal.  Cardiovascular:     Rate and Rhythm: Normal rate and regular rhythm.     Heart sounds: No murmur.  Pulmonary:     Effort: Pulmonary effort is normal. No respiratory distress.     Breath sounds: Normal breath sounds.  Abdominal:     General: Bowel sounds are normal.     Palpations: Abdomen is soft.     Tenderness: There is no abdominal tenderness. There is no guarding or rebound.  Musculoskeletal:     Cervical back: Neck supple.  Skin:    General: Skin is warm and dry.     Findings: No rash.  Neurological:     General: No focal deficit present.     Mental Status: She is alert and oriented to person, place, and time.     Gait: Gait normal.  Psychiatric:        Mood and Affect: Mood  normal.        Behavior: Behavior normal.      UC Treatments / Results  Labs (all labs ordered are listed, but only abnormal results are displayed) Labs Reviewed  CULTURE, GROUP A STREP [facility])  POCT RAPID STREP A (OFFICE)    EKG   Radiology No results found.  Procedures Procedures (including critical care time)  Medications Ordered in UC Medications - No data to display  Initial Impression / Assessment and Plan / UC Course  I have reviewed the triage vital signs and the nursing notes.  Pertinent labs & imaging results that were available during my care of the patient were reviewed by me and considered in my medical decision making (see chart for details).   Sore throat.  Rapid strep negative; throat culture pending.  Due to patient's symptoms, the appearance of her throat, and her daughter's diagnosis, treating with amoxicillin.  Instructed patient to take Tylenol or ibuprofen as needed for discomfort.  Instructed her to follow-up with her PCP if her symptoms or not improving.  Patient agrees to plan of care.     Final Clinical Impressions(s) / UC Diagnoses   Final diagnoses:  Sore throat     Discharge Instructions     Take  the amoxicillin as directed.  Take Tylenol or ibuprofen as needed for discomfort.    Follow-up with your primary care provider if your symptoms are not improving.        ED Prescriptions    Medication Sig Dispense Auth. Provider   amoxicillin (AMOXIL) 875 MG tablet Take 1 tablet (875 mg total) by mouth 2 (two) times daily for 7 days. 14 tablet Mickie Bail, NP     PDMP not reviewed this encounter.   Mickie Bail, NP 10/07/19 1547

## 2019-10-07 NOTE — ED Triage Notes (Signed)
Pt c/o sore throat for several days. Pt reports her daughter tested positive for strep throat last week. Pt also reports sinus congestion and left ear pressure. She denies fever, cough or other symptoms.

## 2019-10-07 NOTE — Telephone Encounter (Signed)
I spoke with patient & due to no appointments in office I suggested that she go to the UC across the street from [place] since you can make online appointments. She stated that she would do that bc she would really like to be seen in person & possibly be tested for strep.

## 2019-10-07 NOTE — Telephone Encounter (Signed)
Pt thinks she has strep throat. Her daughter is just getting over it. She said she has sore throat and very tired.  She wants to know if she can be worked in or where can she go to get a strep test done. Please advise.

## 2019-10-07 NOTE — Discharge Instructions (Signed)
Take the amoxicillin as directed.  Take Tylenol or ibuprofen as needed for discomfort.    Follow up with your primary care provider if your symptoms are not improving.     

## 2019-10-08 NOTE — Telephone Encounter (Signed)
I spoke with patient yesterday & she was seen by UC.

## 2019-10-09 LAB — CULTURE, GROUP A STREP (THRC)

## 2019-11-23 ENCOUNTER — Telehealth (INDEPENDENT_AMBULATORY_CARE_PROVIDER_SITE_OTHER): Payer: BC Managed Care – PPO | Admitting: Family

## 2019-11-23 ENCOUNTER — Encounter: Payer: Self-pay | Admitting: Family

## 2019-11-23 VITALS — Ht 63.0 in | Wt 135.0 lb

## 2019-11-23 DIAGNOSIS — F320 Major depressive disorder, single episode, mild: Secondary | ICD-10-CM | POA: Diagnosis not present

## 2019-11-23 MED ORDER — BUPROPION HCL ER (XL) 150 MG PO TB24: ORAL_TABLET | ORAL | 3 refills | Status: DC

## 2019-11-23 NOTE — Assessment & Plan Note (Addendum)
Worsened of late. Will adjunct zoloft with wellbutrin. zoloft has been helpful for fibromyalgia. Patient will let me know how she is doing. F/u 2 months

## 2019-11-23 NOTE — Progress Notes (Signed)
Virtual Visit via Video Note  I connected with@  on 11/23/19 at  4:00 PM EDT by a video enabled telemedicine application and verified that I am speaking with the correct person using two identifiers.  Location patient: home Location provider:work  Persons participating in the virtual visit: patient, provider  I discussed the limitations of evaluation and management by telemedicine and the availability of in person appointments. The patient expressed understanding and agreed to proceed.   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 Overall doesn't think work or children's education has really affected mood.  Takes zoloft for fibromyalgia.  Presents with fatigue, struggles to 'get going'. More noticeable in the past month when had spring break from work as Pharmacist, hospital. Occasional increased anxiety. Sleeping more.Doesn't feel restored from sleep.  H/o fibromyalgia- triggered by weather changes. Well controlled at this time.  No joint pain at this time Walks once per week No SI/hi No substance use. No heavy alcohol  No h/o eating disorder, seziure.   Migraine- follows with Dr Manuella Ghazi. Overall improved, 'very under control' MRI brain 09/2019  ROS: See pertinent positives and negatives per HPI.  Past Medical History:  Diagnosis Date  . Allergy    Dr. Nehemiah Massed, gold and nickel allergy  . Anxiety   . Bulging discs   . Degenerative disc disease, lumbar   . Depression    off of zoloft for 1 year, had problems with depression and anxiety after last child  . Fibromyalgia    after pregnancy, Followed by [facility]  . Fibromyalgia   . 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 Domingo Cocking at [facility]; migraines since elementary   . IBS (irritable bowel syndrome)   . 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 Spontaneous abortion in second trimester Dec 2014    Past Surgical History:  Procedure Laterality Date  . COLONOSCOPY      normal  . COLPOSCOPY  2002  . CYSTOSCOPY     ureteral stent inserted  . DILATION AND EVACUATION N/A 08/07/2013   Procedure: DILATATION AND EVACUATION;  Surgeon: Marylynn Pearson, MD;  Location: [facility];  Service: Gynecology;  Laterality: N/A;  . LAPAROSCOPY N/A 03/20/2013   Procedure: LAPAROSCOPY OPERATIVE  IUD REMOVAL;  Surgeon: Marylynn Pearson, MD;  Location: [facility];  Service: Gynecology;  Laterality: N/A;  Intraabdominal from [place]  . NASAL SINUS SURGERY    . PILONIDAL CYST EXCISION    . VAGINAL DELIVERY  2011    Family History  Problem Relation Age of Onset  . Healthy Mother   . Hypertension Father   . Cancer Maternal Grandmother        cancer, breast  . Cancer Paternal Grandmother        breast  . Healthy Sister   . Hypertension Paternal Grandfather   . Healthy Daughter   . Healthy Son        Current Outpatient Medications:  .  AJOVY 225 MG/1.5ML SOSY, , Disp: , Rfl:  .  butalbital-acetaminophen-caffeine (FIORICET) 50-325-40 MG tablet, Take 1 tablet by mouth as needed., Disp: , Rfl:  .  cyclobenzaprine (FLEXERIL) 5 MG tablet, Take 1 tablet (5 mg total) by mouth 3 (three) times daily as needed for muscle spasms., Disp: 30 tablet, Rfl: 1 .  LO LOESTRIN FE 1 MG-10 MCG / 10 MCG tablet, Take 1 tablet by mouth daily. as directed, Disp: , Rfl: 9 .  methocarbamol (ROBAXIN) 500 MG  tablet, TAKE 1 TABLET (500 MG TOTAL) BY MOUTH AT BEDTIME., Disp: 30 tablet, Rfl: 0 .  ondansetron (ZOFRAN) 4 MG tablet, TAKE 1 TABLET (4 MG TOTAL) BY MOUTH DAILY AS NEEDED FOR NAUSEA OR VOMITING., Disp: 18 tablet, Rfl: 1 .  promethazine (PHENERGAN) 12.5 MG tablet, Take 1-2 tablets (12.5-25 mg total) by mouth once as needed for up to 1 dose for nausea or vomiting. With migraines., Disp: 20 tablet, Rfl: 2 .  rizatriptan (MAXALT) 10 MG tablet, Take 1 tablet (10 mg total) by mouth as needed for migraine. May repeat in 2 hours if needed. Max dose 3 pills in 24 hours, Disp: 10 tablet, Rfl: 1 .  sertraline  (ZOLOFT) 100 MG tablet, Take 100 mg by mouth daily., Disp: , Rfl: 2 .  SUMAtriptan (IMITREX) 100 MG tablet, Take 1 tablet by mouth daily as needed., Disp: , Rfl:  .  zonisamide (ZONEGRAN) 100 MG capsule, take 2 tabs at hs, Disp: , Rfl: 2 .  buPROPion (WELLBUTRIN XL) 150 MG 24 hr tablet, Start 150 mg ER PO qam, increase after 3 days to 300 mg qam., Disp: 60 tablet, Rfl: 3 .  diclofenac sodium (VOLTAREN) 1 % GEL, Apply 2 grams to 4 grams to affected area up to 4 times daily., Disp: 4 Tube, Rfl: 2 .  gabapentin (NEURONTIN) 300 MG capsule, Take 300 mg by mouth at bedtime. , Disp: , Rfl:  .  naproxen (NAPROSYN) 500 MG tablet, Take 1 tablet (500 mg total) by mouth 2 (two) times daily., Disp: 30 tablet, Rfl: 0 .  predniSONE (DELTASONE) 10 MG tablet, Take 40 mg by mouth on day 1, then taper 10 mg daily until gone, Disp: 10 tablet, Rfl: 0 .  sulfamethoxazole-trimethoprim (BACTRIM DS) 800-160 MG tablet, Take 1 tablet by mouth 2 (two) times daily., Disp: 10 tablet, Rfl: 0 .  UBRELVY 100 MG TABS, Take 1 tablet by mouth daily as needed., Disp: , Rfl:   EXAM:  VITALS per patient if applicable:  GENERAL: alert, oriented, appears well and in no acute distress  HEENT: atraumatic, conjunttiva clear, no obvious abnormalities on inspection of external nose and ears  NECK: normal movements of the head and neck  LUNGS: on inspection no signs of respiratory distress, breathing rate appears normal, no obvious gross SOB, gasping or wheezing  CV: no obvious cyanosis  MS: moves all visible extremities without noticeable abnormality  PSYCH/NEURO: pleasant and cooperative, no obvious depression or anxiety, speech and thought processing grossly intact  ASSESSMENT AND PLAN:  Discussed the following assessment and plan:  Depression, major, single episode, mild (HCC) - Plan: buPROPion (WELLBUTRIN XL) 150 MG 24 hr tablet Problem List Items Addressed This Visit      Other   Depression, major, single episode, mild  (HCC) - Primary    Worsened of late. Will adjunct zoloft with wellbutrin. zoloft has been helpful for fibromyalgia. Patient will let me know how she is doing. F/u 2 months      Relevant Medications   buPROPion (WELLBUTRIN XL) 150 MG 24 hr tablet      -we discussed possible serious and likely etiologies, options for evaluation and workup, limitations of telemedicine visit vs in person visit, treatment, treatment risks and precautions. Pt prefers to treat via telemedicine empirically rather then risking or undertaking an in person visit at this moment. Patient agrees to seek prompt in person care if worsening, new symptoms arise, or if is not improving with treatment.   I discussed the assessment and  treatment plan with the patient. The patient was provided an opportunity to ask questions and all were answered. The patient agreed with the plan and demonstrated an understanding of the instructions.   The patient was advised to call back or seek an in-person evaluation if the symptoms worsen or if the condition fails to improve as anticipated.   Mable Paris, FNP

## 2019-12-16 ENCOUNTER — Other Ambulatory Visit: Payer: Self-pay | Admitting: Family

## 2019-12-16 DIAGNOSIS — F320 Major depressive disorder, single episode, mild: Secondary | ICD-10-CM

## 2020-01-25 ENCOUNTER — Encounter: Payer: Self-pay | Admitting: Family

## 2020-01-25 ENCOUNTER — Other Ambulatory Visit: Payer: Self-pay

## 2020-01-25 ENCOUNTER — Telehealth (INDEPENDENT_AMBULATORY_CARE_PROVIDER_SITE_OTHER): Payer: BC Managed Care – PPO | Admitting: Family

## 2020-01-25 DIAGNOSIS — F320 Major depressive disorder, single episode, mild: Secondary | ICD-10-CM

## 2020-01-25 DIAGNOSIS — N912 Amenorrhea, unspecified: Secondary | ICD-10-CM | POA: Insufficient documentation

## 2020-01-25 NOTE — Assessment & Plan Note (Signed)
Unchanged, patient and I jointly agreed to give Wellbutrin more time.  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  She will let me know how she is doing ;will follow up in 3 months

## 2020-01-25 NOTE — Assessment & Plan Note (Signed)
3 months. Spotting one month ago. Etiology nonspecific at this time. Pending tsh and updating labs as requested by patient to include screening labs. Advised if menses do not return in the next 2 months, she would need to call her GYN and make a follow up. She verbalized understanding of all.

## 2020-01-25 NOTE — Progress Notes (Signed)
Virtual Visit via Video Note  I connected with@  on 01/25/20 at 10:00 AM EDT by a video enabled telemedicine application and verified that I am speaking with the correct person using two identifiers.  Location patient: home Location provider:work Persons participating in the virtual visit: patient, provider  I discussed the limitations of evaluation and management by telemedicine and the availability of in person appointments. The patient expressed understanding and agreed to proceed.  Interactive audio and video telecommunications were attempted between this provider and patient, however failed, due to patient having technical difficulties or patient did not have access to video capability.  We continued and completed visit with audio only.   HPI:  Follow-up depression after starting Wellbutrin 300mg   about 8 weeks ago. Depression unchanged.  As , now out for summer and hard to see exactly how helpful the wellbutrin will be. Would like to give this more time. Compliant with zoloft.   Last spot had spotting. Light pink in color.  Last regular menses was 3 months ago. Prior to would skip one month, not normally 3 months. Prior menses felt normal for her. No abdominal cramping, blood clots.  No changes to vaginal discharge, vaginal itching.   Weight stable. No changes to activity level.  [facility] who prescribes OCP for ovarian cyst per patient. Migraines had also previously been menstrual related.  Husband has had vasectomy.    ROS: See pertinent positives and negatives per HPI.  Past Medical History:  Diagnosis Date  . 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 Anxiety   . Bulging discs   . Degenerative disc disease, lumbar   . Depression    off of zoloft for 1 year, had problems with depression and anxiety after last child  . Fibromyalgia    after pregnancy, Followed by [facility]  . Fibromyalgia   . Gwen Pounds)    Dr. [id number](937.1 at  [facility]; migraines since elementary   . IBS (irritable bowel syndrome)   . 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 Spontaneous abortion in second trimester Dec 2014    Past Surgical History:  Procedure Laterality Date  . COLONOSCOPY     normal  . COLPOSCOPY  2002  . CYSTOSCOPY     ureteral stent inserted  . DILATION AND EVACUATION N/A 08/07/2013   Procedure: DILATATION AND EVACUATION;  Surgeon: 10/05/2013, MD;  Location: [facility];  Service: Gynecology;  Laterality: N/A;  . LAPAROSCOPY N/A 03/20/2013   Procedure: LAPAROSCOPY OPERATIVE  IUD REMOVAL;  Surgeon: 03/22/2013, MD;  Location: [facility];  Service: Gynecology;  Laterality: N/A;  Intraabdominal from [place]  . NASAL SINUS SURGERY    . PILONIDAL CYST EXCISION    . VAGINAL DELIVERY  2011    Family History  Problem Relation Age of Onset  . Healthy Mother   . Hypertension Father   . Cancer Maternal Grandmother        cancer, breast  . Cancer Paternal Grandmother        breast  . Healthy Sister   . Hypertension Paternal Grandfather   . Healthy Daughter   . Healthy Son        Current Outpatient Medications:  .  AJOVY 225 MG/1.5ML SOSY, , Disp: , Rfl:  .  buPROPion (WELLBUTRIN XL) 150 MG 24 hr tablet, START WITH TAKING 1 TABLET BY MOUTH EACH AM, THEN INCREASE AFTER 3 DAYS TO 2 TABLETS (300MG ) EACH AM,  Disp: 180 tablet, Rfl: 2 .  butalbital-acetaminophen-caffeine (FIORICET) 50-325-40 MG tablet, Take 1 tablet by mouth as needed., Disp: , Rfl:  .  cyclobenzaprine (FLEXERIL) 5 MG tablet, Take 1 tablet (5 mg total) by mouth 3 (three) times daily as needed for muscle spasms., Disp: 30 tablet, Rfl: 1 .  gabapentin (NEURONTIN) 300 MG capsule, Take 300 mg by mouth at bedtime. , Disp: , Rfl:  .  LO LOESTRIN FE 1 MG-10 MCG / 10 MCG tablet, Take 1 tablet by mouth daily. as directed, Disp: , Rfl: 9 .  methocarbamol (ROBAXIN) 500 MG tablet, TAKE 1 TABLET (500 MG TOTAL) BY MOUTH AT  BEDTIME., Disp: 30 tablet, Rfl: 0 .  ondansetron (ZOFRAN) 4 MG tablet, TAKE 1 TABLET (4 MG TOTAL) BY MOUTH DAILY AS NEEDED FOR NAUSEA OR VOMITING., Disp: 18 tablet, Rfl: 1 .  promethazine (PHENERGAN) 12.5 MG tablet, Take 1-2 tablets (12.5-25 mg total) by mouth once as needed for up to 1 dose for nausea or vomiting. With migraines., Disp: 20 tablet, Rfl: 2 .  rizatriptan (MAXALT) 10 MG tablet, Take 1 tablet (10 mg total) by mouth as needed for migraine. May repeat in 2 hours if needed. Max dose 3 pills in 24 hours, Disp: 10 tablet, Rfl: 1 .  sertraline (ZOLOFT) 100 MG tablet, Take 100 mg by mouth daily., Disp: , Rfl: 2 .  SUMAtriptan (IMITREX) 100 MG tablet, Take 1 tablet by mouth daily as needed., Disp: , Rfl:  .  UBRELVY 100 MG TABS, Take 1 tablet by mouth daily as needed., Disp: , Rfl:  .  zonisamide (ZONEGRAN) 100 MG capsule, take 2 tabs at hs, Disp: , Rfl: 2 .  diclofenac sodium (VOLTAREN) 1 % GEL, Apply 2 grams to 4 grams to affected area up to 4 times daily. (Patient not taking: Reported on 01/25/2020), Disp: 4 Tube, Rfl: 2 .  naproxen (NAPROSYN) 500 MG tablet, Take 1 tablet (500 mg total) by mouth 2 (two) times daily. (Patient not taking: Reported on 01/25/2020), Disp: 30 tablet, Rfl: 0 .  predniSONE (DELTASONE) 10 MG tablet, Take 40 mg by mouth on day 1, then taper 10 mg daily until gone (Patient not taking: Reported on 01/25/2020), Disp: 10 tablet, Rfl: 0 .  sulfamethoxazole-trimethoprim (BACTRIM DS) 800-160 MG tablet, Take 1 tablet by mouth 2 (two) times daily. (Patient not taking: Reported on 01/25/2020), Disp: 10 tablet, Rfl: 0  EXAM:  VITALS per patient if applicable:   PSYCH/NEURO: pleasant and cooperative, no obvious depression or anxiety, speech and thought processing grossly intact  ASSESSMENT AND PLAN:  Discussed the following assessment and plan:  Depression, major, single episode, mild (HCC)  Amenorrhea Problem List Items Addressed This Visit      Other   Amenorrhea     3 months. Spotting one month ago. Etiology nonspecific at this time. Pending tsh and updating labs as requested by patient to include screening labs. Advised if menses do not return in the next 2 months, she would need to call her GYN and make a follow up. She verbalized understanding of all.       Depression, major, single episode, mild (HCC)    Unchanged, patient and I jointly agreed to give Wellbutrin more time.  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  She will let me know how she is doing ;will follow up in 3 months         -we discussed possible serious and likely etiologies, options for  evaluation and workup, limitations of telemedicine visit vs in person visit, treatment, treatment risks and precautions. Pt prefers to treat via telemedicine empirically rather then risking or undertaking an in person visit at this moment. Patient agrees to seek prompt in person care if worsening, new symptoms arise, or if is not improving with treatment.   I discussed the assessment and treatment plan with the patient. The patient was provided an opportunity to ask questions and all were answered. The patient agreed with the plan and demonstrated an understanding of the instructions.   The patient was advised to call back or seek an in-person evaluation if the symptoms worsen or if the condition fails to improve as anticipated.   Mable Paris, FNP   I have spent 18 minutes with a patient including  reviewing medical records, and discussion plan of care.

## 2020-01-29 ENCOUNTER — Telehealth: Payer: Self-pay | Admitting: *Deleted

## 2020-01-29 DIAGNOSIS — N912 Amenorrhea, unspecified: Secondary | ICD-10-CM

## 2020-01-29 NOTE — Telephone Encounter (Signed)
Please place future orders for lab appt.  

## 2020-02-02 ENCOUNTER — Other Ambulatory Visit: Payer: Self-pay

## 2020-02-02 ENCOUNTER — Other Ambulatory Visit (INDEPENDENT_AMBULATORY_CARE_PROVIDER_SITE_OTHER): Payer: BC Managed Care – PPO

## 2020-02-02 DIAGNOSIS — N912 Amenorrhea, unspecified: Secondary | ICD-10-CM

## 2020-02-02 LAB — COMPREHENSIVE METABOLIC PANEL
ALT: 9 U/L (ref 0–35)
AST: 13 U/L (ref 0–37)
Albumin: 4.5 g/dL (ref 3.5–5.2)
Alkaline Phosphatase: 64 U/L (ref 39–117)
BUN: 14 mg/dL (ref 6–23)
CO2: 23 mEq/L (ref 19–32)
Calcium: 9.1 mg/dL (ref 8.4–10.5)
Chloride: 107 mEq/L (ref 96–112)
Creatinine, Ser: 1.02 mg/dL (ref 0.40–1.20)
GFR: 60.35 mL/min (ref >60.00)
Glucose, Bld: 89 mg/dL (ref 70–99)
Potassium: 3.9 mEq/L (ref 3.5–5.1)
Sodium: 139 mEq/L (ref 135–145)
Total Bilirubin: 0.5 mg/dL (ref 0.2–1.2)
Total Protein: 6.5 g/dL (ref 6.0–8.3)

## 2020-02-02 LAB — CBC WITH DIFFERENTIAL/PLATELET
Basophils Absolute: 0.1 10*3/uL (ref 0.0–0.1)
Basophils Relative: 1 % (ref 0.0–3.0)
Eosinophils Absolute: 0.1 10*3/uL (ref 0.0–0.7)
Eosinophils Relative: 1.6 % (ref 0.0–5.0)
HCT: 42.4 % (ref 36.0–46.0)
Hemoglobin: 15 g/dL (ref 12.0–15.0)
Lymphocytes Relative: 33.9 % (ref 12.0–46.0)
Lymphs Abs: 1.9 10*3/uL (ref 0.7–4.0)
MCHC: 35.3 g/dL (ref 30.0–36.0)
MCV: 95 fl (ref 78.0–100.0)
Monocytes Absolute: 0.4 10*3/uL (ref 0.1–1.0)
Monocytes Relative: 7.2 % (ref 3.0–12.0)
Neutro Abs: 3.1 10*3/uL (ref 1.4–7.7)
Neutrophils Relative %: 56.3 % (ref 43.0–77.0)
Platelets: 205 10*3/uL (ref 150.0–400.0)
RBC: 4.47 Mil/uL (ref 3.87–5.11)
RDW: 12.4 % (ref 11.5–15.5)
WBC: 5.5 10*3/uL (ref 4.0–10.5)

## 2020-02-02 LAB — LIPID PANEL
Cholesterol: 185 mg/dL (ref 0–200)
HDL: 51.5 mg/dL (ref >39.00)
LDL Cholesterol: 119 mg/dL — ABNORMAL HIGH (ref 0–99)
NonHDL: 133.01
Total CHOL/HDL Ratio: 4
Triglycerides: 72 mg/dL (ref 0.0–149.0)
VLDL: 14.4 mg/dL (ref 0.0–40.0)

## 2020-02-02 LAB — TSH: TSH: 1.29 u[IU]/mL (ref 0.35–4.50)

## 2020-02-02 LAB — HEMOGLOBIN A1C: Hgb A1c MFr Bld: 4.7 % (ref 4.6–6.5)

## 2020-02-02 LAB — VITAMIN D 25 HYDROXY (VIT D DEFICIENCY, FRACTURES): VITD: 45.23 ng/mL (ref 30.00–100.00)

## 2020-02-23 ENCOUNTER — Other Ambulatory Visit: Payer: Self-pay

## 2020-02-23 ENCOUNTER — Ambulatory Visit (INDEPENDENT_AMBULATORY_CARE_PROVIDER_SITE_OTHER): Payer: BC Managed Care – PPO | Admitting: Family

## 2020-02-23 ENCOUNTER — Encounter: Payer: Self-pay | Admitting: Family

## 2020-02-23 ENCOUNTER — Telehealth: Payer: Self-pay | Admitting: Family

## 2020-02-23 VITALS — BP 112/64 | HR 98 | Temp 98.3°F | Resp 14 | Ht 63.0 in | Wt 138.0 lb

## 2020-02-23 DIAGNOSIS — M545 Low back pain, unspecified: Secondary | ICD-10-CM

## 2020-02-23 DIAGNOSIS — K5909 Other constipation: Secondary | ICD-10-CM

## 2020-02-23 DIAGNOSIS — R300 Dysuria: Secondary | ICD-10-CM | POA: Diagnosis not present

## 2020-02-23 LAB — URINALYSIS, MICROSCOPIC ONLY

## 2020-02-23 LAB — POCT URINALYSIS DIPSTICK
Bilirubin, UA: POSITIVE
Blood, UA: NEGATIVE
Glucose, UA: NEGATIVE
Ketones, UA: NEGATIVE
Nitrite, UA: POSITIVE
Protein, UA: NEGATIVE
Spec Grav, UA: 1.01 (ref 1.010–1.025)
Urobilinogen, UA: 4 E.U./dL — AB
pH, UA: 7 (ref 5.0–8.0)

## 2020-02-23 MED ORDER — AMOXICILLIN-POT CLAVULANATE 875-125 MG PO TABS: 1.0000 | ORAL_TABLET | Freq: Two times a day (BID) | ORAL | 0 refills | Status: DC

## 2020-02-23 NOTE — Patient Instructions (Addendum)
Happy [birth year] !!!  Start augmentin for suspected urinary tract infection Ensure to take probiotics while on antibiotics and also for 2 weeks after completion. It is important to re-colonize the gut with good bacteria and also to prevent any diarrheal infections associated with antibiotic use.   Please increase fiber and use miralax if no bowel movement today  Please call us tomorrow and let us know how you feel and in particular if your left low abdominal pain, low back pain is still present.  Any new or worsening of your current symptoms, please call us immediately as discussed at length today.   Urinary Tract Infection, Adult  A urinary tract infection (UTI) is an infection of any part of the urinary tract. The urinary tract includes the kidneys, ureters, bladder, and urethra. These organs make, store, and get rid of urine in the body. Your health care provider may use other names to describe the infection. An upper UTI affects the ureters and kidneys (pyelonephritis). A lower UTI affects the bladder (cystitis) and urethra (urethritis). What are the causes? Most urinary tract infections are caused by bacteria in your genital area, around the entrance to your urinary tract (urethra). These bacteria grow and cause inflammation of your urinary tract. What increases the risk? You are more likely to develop this condition if:  You have a urinary catheter that stays in place (indwelling).  You are not able to control when you urinate or have a bowel movement (you have incontinence).  You are female and you: ? Use a spermicide or diaphragm for birth control. ? Have low estrogen levels. ? Are pregnant.  You have certain genes that increase your risk (genetics).  You are sexually active.  You take antibiotic medicines.  You have a condition that causes your flow of urine to slow down, such as: ? An enlarged prostate, if you are female. ? Blockage in your urethra (stricture). ? A kidney  stone. ? A nerve condition that affects your bladder control (neurogenic bladder). ? Not getting enough to drink, or not urinating often.  You have certain medical conditions, such as: ? Diabetes. ? A weak disease-fighting system (immunesystem). ? Sickle cell disease. ? Gout. ? Spinal cord injury. What are the signs or symptoms? Symptoms of this condition include:  Needing to urinate right away (urgently).  Frequent urination or passing small amounts of urine frequently.  Pain or burning with urination.  Blood in the urine.  Urine that smells bad or unusual.  Trouble urinating.  Cloudy urine.  Vaginal discharge, if you are female.  Pain in the abdomen or the lower back. You may also have:  Vomiting or a decreased appetite.  Confusion.  Irritability or tiredness.  A fever.  Diarrhea. The first symptom in older adults may be confusion. In some cases, they may not have any symptoms until the infection has worsened. How is this diagnosed? This condition is diagnosed based on your medical history and a physical exam. You may also have other tests, including:  Urine tests.  Blood tests.  Tests for sexually transmitted infections (STIs). If you have had more than one UTI, a cystoscopy or imaging studies may be done to determine the cause of the infections. How is this treated? Treatment for this condition includes:  Antibiotic medicine.  Over-the-counter medicines to treat discomfort.  Drinking enough water to stay hydrated. If you have frequent infections or have other conditions such as a kidney stone, you may need to see a health care provider  who specializes in the urinary tract (urologist). In rare cases, urinary tract infections can cause sepsis. Sepsis is a life-threatening condition that occurs when the body responds to an infection. Sepsis is treated in the hospital with IV antibiotics, fluids, and other medicines. Follow these instructions at  home:  Medicines  Take over-the-counter and prescription medicines only as told by your health care provider.  If you were prescribed an antibiotic medicine, take it as told by your health care provider. Do not stop using the antibiotic even if you start to feel better. General instructions  Make sure you: ? Empty your bladder often and completely. Do not hold urine for long periods of time. ? Empty your bladder after sex. ? Wipe from front to back after a bowel movement if you are female. Use each tissue one time when you wipe.  Drink enough fluid to keep your urine pale yellow.  Keep all follow-up visits as told by your health care provider. This is important. Contact a health care provider if:  Your symptoms do not get better after 1-2 days.  Your symptoms go away and then return. Get help right away if you have:  Severe pain in your back or your lower abdomen.  A fever.  Nausea or vomiting. Summary  A urinary tract infection (UTI) is an infection of any part of the urinary tract, which includes the kidneys, ureters, bladder, and urethra.  Most urinary tract infections are caused by bacteria in your genital area, around the entrance to your urinary tract (urethra).  Treatment for this condition often includes antibiotic medicines.  If you were prescribed an antibiotic medicine, take it as told by your health care provider. Do not stop using the antibiotic even if you start to feel better.  Keep all follow-up visits as told by your health care provider. This is important. This information is not intended to replace advice given to you by your health care provider. Make sure you discuss any questions you have with your health care provider. Document Revised: 07/10/2018 Document Reviewed: 01/30/2018 Elsevier Patient Education  2020 [employer].

## 2020-02-23 NOTE — Progress Notes (Signed)
Subjective:    Patient ID: Alejandra Cook, female    DOB: 07/05/1981, 39 y.o.   MRN: [id number]  CC: Alejandra Cook is a 39 y.o. female who presents today for an acute visit.    HPI: Complains left low back pain x one day, which has migrated down to left sided suprapubic pain.  Started to have urinary frequency, suprapubic pressure.  Started azo and drinking lots of water.  NO dysuria, fever, n, vomiting, hematuria, changes to vaginal discharge, concerns STDs,  bloating.   Had self limited 3-4 episodes diarrhea 2 days ago, since resolved . She hasnt had a bowel movement since.   H/o IBS.  No h/o diverticulitis.    H/o renal stone       HISTORY:  Past Medical History:  Diagnosis Date  . Allergy    Dr. Gwen Pounds, gold and nickel allergy  . Anxiety   . Bulging discs   . Degenerative disc disease, lumbar   . Depression    off of zoloft for 1 year, had problems with depression and anxiety after last child  . Fibromyalgia    after pregnancy, Followed by [facility]  . Fibromyalgia   . PPJKDTOI(712.4)    Dr. Neale Burly at [facility]; migraines since elementary   . IBS (irritable bowel syndrome)   . 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 Spontaneous abortion in second trimester Dec 2014   Past Surgical History:  Procedure Laterality Date  . COLONOSCOPY     normal  . COLPOSCOPY  2002  . CYSTOSCOPY     ureteral stent inserted  . DILATION AND EVACUATION N/A 08/07/2013   Procedure: DILATATION AND EVACUATION;  Surgeon: Zelphia Cairo, MD;  Location: [facility];  Service: Gynecology;  Laterality: N/A;  . LAPAROSCOPY N/A 03/20/2013   Procedure: LAPAROSCOPY OPERATIVE  IUD REMOVAL;  Surgeon: Zelphia Cairo, MD;  Location: [facility];  Service: Gynecology;  Laterality: N/A;  Intraabdominal from [place]mentum  . NASAL SINUS SURGERY    . PILONIDAL CYST EXCISION    . VAGINAL DELIVERY  2011   Family History  Problem Relation Age of Onset  .  Healthy Mother   . Hypertension Father   . Cancer Maternal Grandmother        cancer, breast  . Cancer Paternal Grandmother        breast  . Healthy Sister   . Hypertension Paternal Grandfather   . Healthy Daughter   . Healthy Son     Allergies: Patient has no known allergies. Current Outpatient Medications on File Prior to Visit  Medication Sig Dispense Refill  . AJOVY 225 MG/1.5ML SOSY     . buPROPion (WELLBUTRIN XL) 150 MG 24 hr tablet START WITH TAKING 1 TABLET BY MOUTH EACH AM, THEN INCREASE AFTER 3 DAYS TO 2 TABLETS (300MG ) EACH AM 180 tablet 2  . butalbital-acetaminophen-caffeine (FIORICET) 50-325-40 MG tablet Take 1 tablet by mouth as needed.    . cyclobenzaprine (FLEXERIL) 5 MG tablet Take 1 tablet (5 mg total) by mouth 3 (three) times daily as needed for muscle spasms. 30 tablet 1  . LO LOESTRIN FE 1 MG-10 MCG / 10 MCG tablet Take 1 tablet by mouth daily. as directed  9  . ondansetron (ZOFRAN) 4 MG tablet TAKE 1 TABLET (4 MG TOTAL) BY MOUTH DAILY AS NEEDED FOR NAUSEA OR VOMITING. 18 tablet 1  . promethazine (PHENERGAN) 12.5 MG tablet Take 1-2 tablets (12.5-25 mg total) by mouth once as needed  for up to 1 dose for nausea or vomiting. With migraines. 20 tablet 2  . rizatriptan (MAXALT) 10 MG tablet Take 1 tablet (10 mg total) by mouth as needed for migraine. May repeat in 2 hours if needed. Max dose 3 pills in 24 hours 10 tablet 1  . sertraline (ZOLOFT) 100 MG tablet Take 100 mg by mouth daily.  2  . SUMAtriptan (IMITREX) 100 MG tablet Take 1 tablet by mouth daily as needed.    . UBRELVY 100 MG TABS Take 1 tablet by mouth daily as needed.    . zonisamide (ZONEGRAN) 100 MG capsule take 2 tabs at hs  2  . gabapentin (NEURONTIN) 300 MG capsule Take 300 mg by mouth at bedtime.      No current facility-administered medications on file prior to visit.    Social History   Tobacco Use  . Smoking status: Never Smoker  . Smokeless tobacco: Never Used  Vaping Use  . Vaping Use:  Never used  Substance Use Topics  . Alcohol use: No    Alcohol/week: 0.0 standard drinks  . Drug use: Never    Review of Systems  Constitutional: Negative for chills and fever.  Respiratory: Negative for cough.   Cardiovascular: Negative for chest pain and palpitations.  Gastrointestinal: Positive for constipation and diarrhea (resolved). Negative for abdominal distention, nausea and vomiting.  Genitourinary: Positive for frequency and pelvic pain (left). Negative for dysuria, hematuria and vaginal discharge.      Objective:    BP 112/64 (BP Location: Left Arm, Patient Position: Sitting, Cuff Size: Normal)   Pulse 98   Temp 98.3 F (36.8 C) (Oral)   Resp 14   Ht 5\' 3"  (1.6 m)   Wt 138 lb (62.6 kg)   LMP  (LMP Unknown)   SpO2 98%   BMI 24.45 kg/m    Physical Exam Vitals reviewed.  Constitutional:      Appearance: She is well-developed.  Cardiovascular:     Rate and Rhythm: Normal rate and regular rhythm.     Pulses: Normal pulses.     Heart sounds: Normal heart sounds.  Pulmonary:     Effort: Pulmonary effort is normal.     Breath sounds: Normal breath sounds. No wheezing, rhonchi or rales.  Abdominal:     General: Bowel sounds are normal.     Tenderness: There is abdominal tenderness in the left lower quadrant. There is no right CVA tenderness, left CVA tenderness or guarding.     Comments: Tenderness, mild appreciated with deep palpation of LLQ.   Musculoskeletal:     Lumbar back: No tenderness or bony tenderness. Normal range of motion.     Comments: No rash appreciated.  Skin:    General: Skin is warm and dry.  Neurological:     Mental Status: She is alert.  Psychiatric:        Speech: Speech normal.        Behavior: Behavior normal.        Thought Content: Thought content normal.        Assessment & Plan:   Problem List Items Addressed This Visit      Other   Low back pain    Patient is nontoxic in appearance, she is afebrile.  Differentials  include renal stone, UTI, diverticulitis.  Patient's pain is been migratory which raise my condition for renal stone however no blood was seen in urine today.  She was positive for nitrites, leukocyte raising the suspicion for  UTI.  Pending urine culture., On exam, she did have mild tenderness with palpation left lower quadrant; question whether this related to GI or bladder discomfort in setting of UTI.   we discussed the CT abdomen and pelvis to evaluate for diverticulitis.  Patient politely declines at this time and she willing to trial antibiotics first which I think is appropriate as long as she remains incredibly vigilant. She will call us tomorrow and let us know how she is doing. I  opted for Augmentin.  She will start probiotics.       Other Visit Diagnoses    Dysuria    -  Primary   Relevant Medications   amoxicillin-clavulanate (AUGMENTIN) 875-125 MG tablet   Other Relevant Orders   POCT Urinalysis Dipstick (Completed)   Urine Culture   Urine Microscopic Only         I have discontinued Caili H. Goshert's predniSONE, diclofenac sodium, naproxen, sulfamethoxazole-trimethoprim, and methocarbamol. I am also having her start on amoxicillin-clavulanate. Additionally, I am having her maintain her Lo Loestrin Fe, sertraline, zonisamide, rizatriptan, promethazine, Ajovy, gabapentin, ondansetron, cyclobenzaprine, butalbital-acetaminophen-caffeine, SUMAtriptan, Ubrelvy, and buPROPion.   Meds ordered this encounter  Medications  . amoxicillin-clavulanate (AUGMENTIN) 875-125 MG tablet    Sig: Take 1 tablet by mouth 2 (two) times daily for 7 days.    Dispense:  14 tablet    Refill:  0    Order Specific Question:   Supervising Provider    Answer:   Sherlene Shams [[id number]]    Return precautions given.   Risks, benefits, and alternatives of the medications and treatment plan prescribed today were discussed, and patient expressed understanding.   Education regarding symptom management and  diagnosis given to patient on AVS.  Continue to follow with Allegra Grana, FNP for routine health maintenance.   Rayvn Ezra Sites and I agreed with plan.   Rennie Plowman, FNP

## 2020-02-23 NOTE — Telephone Encounter (Signed)
Call pt  02/24/20 We saw her 02/23/20  How is her suprapubic, lower abdominal pain? How is her back pain? Has she had a normal bowel movement?

## 2020-02-23 NOTE — Assessment & Plan Note (Addendum)
Patient is nontoxic in appearance, she is afebrile.  Differentials include renal stone, UTI, diverticulitis.  Patient's pain is been migratory which raise my condition for renal stone however no blood was seen in urine today.  She was positive for nitrites, leukocyte raising the suspicion for UTI.  Pending urine culture., On exam, she did have mild tenderness with palpation left lower quadrant; question whether this related to GI or bladder discomfort in setting of UTI.   we discussed the CT abdomen and pelvis to evaluate for diverticulitis.  Patient politely declines at this time and she willing to trial antibiotics first which I think is appropriate as long as she remains incredibly vigilant. She will call us tomorrow and let us know how she is doing. I  opted for Augmentin.  She will start probiotics.

## 2020-02-24 ENCOUNTER — Telehealth: Payer: Self-pay | Admitting: Family

## 2020-02-24 ENCOUNTER — Other Ambulatory Visit: Payer: BC Managed Care – PPO

## 2020-02-24 ENCOUNTER — Other Ambulatory Visit (HOSPITAL_COMMUNITY)
Admission: RE | Admit: 2020-02-24 | Discharge: 2020-02-24 | Disposition: A | Discharge status: Home / Self Care | Payer: BC Managed Care – PPO | Source: Ambulatory Visit | Attending: Family | Admitting: Family

## 2020-02-24 ENCOUNTER — Other Ambulatory Visit: Payer: Self-pay | Admitting: Family

## 2020-02-24 ENCOUNTER — Encounter: Payer: Self-pay | Admitting: Family

## 2020-02-24 DIAGNOSIS — R8290 Unspecified abnormal findings in urine: Secondary | ICD-10-CM

## 2020-02-24 LAB — URINE CULTURE
MICRO NUMBER:: 10726508
SPECIMEN QUALITY:: ADEQUATE

## 2020-02-24 NOTE — Telephone Encounter (Signed)
Spoke with patient. She feels the same.  She denies feeling any worse. Her back pain is the same. It has not migrated. She has no blood in her urine. No fever, nausea or vomiting. She would like to see what the urine shows today prior to ordering a CT. She does not have concerns of yeast or bacterial vaginitis at this time  we discussed again differentials including most likely UTI. The absence of blood makes me less likely to think renal stone. I did express in regards to the left lower quadrant pain, and still consider diverticulitis which would require CT abdomen pelvis. At this time, again patient politely declines. She would like to see what urine specimen looks like and how she feels tomorrow. We will touch base again tomorrow

## 2020-02-25 ENCOUNTER — Encounter: Payer: Self-pay | Admitting: Family

## 2020-02-25 ENCOUNTER — Telehealth: Payer: Self-pay

## 2020-02-25 ENCOUNTER — Other Ambulatory Visit
Admission: RE | Admit: 2020-02-25 | Discharge: 2020-02-25 | Disposition: A | Discharge status: Home / Self Care | Payer: BC Managed Care – PPO | Source: Ambulatory Visit | Attending: Family | Admitting: Family

## 2020-02-25 ENCOUNTER — Other Ambulatory Visit: Payer: Self-pay

## 2020-02-25 ENCOUNTER — Telehealth: Payer: Self-pay | Admitting: Family

## 2020-02-25 ENCOUNTER — Ambulatory Visit
Admission: RE | Admit: 2020-02-25 | Discharge: 2020-02-25 | Disposition: A | Discharge status: Home / Self Care | Payer: BC Managed Care – PPO | Source: Ambulatory Visit | Attending: Family | Admitting: Family

## 2020-02-25 DIAGNOSIS — R1032 Left lower quadrant pain: Secondary | ICD-10-CM

## 2020-02-25 LAB — CBC WITH DIFFERENTIAL/PLATELET
Abs Immature Granulocytes: 0.02 10*3/uL (ref 0.00–0.07)
Basophils Absolute: 0.1 10*3/uL (ref 0.0–0.1)
Basophils Relative: 1 %
Eosinophils Absolute: 0.1 10*3/uL (ref 0.0–0.5)
Eosinophils Relative: 1 %
HCT: 43.5 % (ref 36.0–46.0)
Hemoglobin: 15.1 g/dL — ABNORMAL HIGH (ref 12.0–15.0)
Immature Granulocytes: 0 %
Lymphocytes Relative: 29 %
Lymphs Abs: 2.2 10*3/uL (ref 0.7–4.0)
MCH: 32.8 pg (ref 26.0–34.0)
MCHC: 34.7 g/dL (ref 30.0–36.0)
MCV: 94.4 fL (ref 80.0–100.0)
Monocytes Absolute: 0.5 10*3/uL (ref 0.1–1.0)
Monocytes Relative: 7 %
Neutro Abs: 4.8 10*3/uL (ref 1.7–7.7)
Neutrophils Relative %: 62 %
Platelets: 231 10*3/uL (ref 150–400)
RBC: 4.61 MIL/uL (ref 3.87–5.11)
RDW: 11.9 % (ref 11.5–15.5)
WBC: 7.7 10*3/uL (ref 4.0–10.5)
nRBC: 0 % (ref 0.0–0.2)

## 2020-02-25 LAB — COMPREHENSIVE METABOLIC PANEL
ALT: 12 U/L (ref 0–44)
AST: 17 U/L (ref 15–41)
Albumin: 4.8 g/dL (ref 3.5–5.0)
Alkaline Phosphatase: 62 U/L (ref 38–126)
Anion gap: 9 (ref 5–15)
BUN: 15 mg/dL (ref 6–20)
CO2: 24 mmol/L (ref 22–32)
Calcium: 9.1 mg/dL (ref 8.9–10.3)
Chloride: 108 mmol/L (ref 98–111)
Creatinine, Ser: 1.03 mg/dL — ABNORMAL HIGH (ref 0.44–1.00)
GFR calc Af Amer: >60 mL/min (ref >60)
GFR calc non Af Amer: >60 mL/min (ref >60)
Glucose, Bld: 101 mg/dL — ABNORMAL HIGH (ref 70–99)
Potassium: 3.8 mmol/L (ref 3.5–5.1)
Sodium: 141 mmol/L (ref 135–145)
Total Bilirubin: 0.7 mg/dL (ref 0.3–1.2)
Total Protein: 7.2 g/dL (ref 6.5–8.1)

## 2020-02-25 LAB — LIPASE, BLOOD: Lipase: 47 U/L (ref 11–51)

## 2020-02-25 LAB — AMYLASE: Amylase: 86 U/L (ref 28–100)

## 2020-02-25 MED ORDER — IOHEXOL 300 MG/ML  SOLN
100.0000 mL | Freq: Once | INTRAMUSCULAR | Status: AC | PRN
  Administered 2020-02-25: 100 mL via INTRAVENOUS

## 2020-02-25 NOTE — Telephone Encounter (Signed)
Called patient and gave orders to go to [facility] for stat labs and CT. Reid has eaten around 12:30 pm.

## 2020-02-25 NOTE — Telephone Encounter (Signed)
Patient has responded to your questions on continued back pain.

## 2020-02-25 NOTE — Telephone Encounter (Signed)
azzel  Call pt I have ordered Stat labs to be done at [facility]. Advise to have these done before or after her ct ab and pelvis When was the last time that she ate or drink?   Rasheedah, I have ordered stat ct abdomen and pelvis, can we arrange today.

## 2020-02-25 NOTE — Telephone Encounter (Signed)
-----   Message from Allegra Grana, FNP sent at 02/25/2020  9:17 AM EDT ----- Call pt-  Urine doesn't show infection How is she feeling? Does she still have low back or abdominal ( left lower) pain?

## 2020-02-26 LAB — URINE CYTOLOGY ANCILLARY ONLY
Bacterial Vaginitis-Urine: POSITIVE — AB
Candida Urine: POSITIVE — AB

## 2020-02-26 NOTE — Telephone Encounter (Signed)
[name] to Allegra Grana, FNP      02/24/20 12:30 PM I am following up from my appointment yesterday. I do not feel any worse, but also do not feel better. I did take some tylenol this morning since I had to go to work. The  pain seemed to ease up for a bit.   Patient followed up from appointment on 02/23/20 and went in for Stat Labs and CT scan on 02/25/20.

## 2020-02-27 ENCOUNTER — Other Ambulatory Visit: Payer: Self-pay | Admitting: Family

## 2020-02-27 DIAGNOSIS — B3731 Acute candidiasis of vulva and vagina: Secondary | ICD-10-CM

## 2020-02-27 DIAGNOSIS — B373 Candidiasis of vulva and vagina: Secondary | ICD-10-CM

## 2020-02-27 DIAGNOSIS — B9689 Other specified bacterial agents as the cause of diseases classified elsewhere: Secondary | ICD-10-CM

## 2020-02-27 MED ORDER — FLUCONAZOLE 150 MG PO TABS: ORAL_TABLET | ORAL | 1 refills | Status: DC

## 2020-02-27 MED ORDER — METRONIDAZOLE 0.75 % EX GEL: 1.0000 "application " | Freq: Every day | CUTANEOUS | 0 refills | Status: AC

## 2020-03-16 ENCOUNTER — Encounter: Payer: Self-pay | Admitting: Family

## 2020-03-17 NOTE — Telephone Encounter (Signed)
Pt is still having trouble using the restroom. She has no bowel movements during the week and can only use the restroom with the help of laxitives which causes diarrhea. She has not had a solid bowel movement since last visit for this issue. Alejandra Cook asks for a referral to see gastroenterology and fears she has a blockage.

## 2020-03-17 NOTE — Addendum Note (Signed)
Addended by: Quentin Ore on: 03/17/2020 01:41 PM   Modules accepted: Orders

## 2020-03-17 NOTE — Telephone Encounter (Signed)
Called and spoke to patient. Alejandra Cook is scheduled to see GI in October. Recommended going to the ER or Urgent care if she is experiencing more pains and can not wait until [facility] visit.

## 2020-04-27 ENCOUNTER — Telehealth: Payer: BC Managed Care – PPO | Admitting: Family

## 2020-05-09 ENCOUNTER — Telehealth (INDEPENDENT_AMBULATORY_CARE_PROVIDER_SITE_OTHER): Payer: BC Managed Care – PPO | Admitting: Family

## 2020-05-09 ENCOUNTER — Encounter: Payer: Self-pay | Admitting: Family

## 2020-05-09 DIAGNOSIS — F320 Major depressive disorder, single episode, mild: Secondary | ICD-10-CM

## 2020-05-09 MED ORDER — BUPROPION HCL ER (XL) 300 MG PO TB24: 300.0000 mg | ORAL_TABLET | Freq: Every morning | ORAL | 3 refills | Status: DC

## 2020-05-09 NOTE — Patient Instructions (Addendum)
Check on tetanus ( tdap) and pap with your GYN  Stay safe!

## 2020-05-09 NOTE — Assessment & Plan Note (Signed)
Pleased improved. Patient feels current regimen very helpful. Continue wellbutrin 300mg  qam and zoloft 100 mg qhs.

## 2020-05-09 NOTE — Progress Notes (Signed)
Virtual Visit via Video Note  I connected with@  on 05/09/20 at 11:30 AM EDT by a video enabled telemedicine application and verified that I am speaking with the correct person using two identifiers.  Location patient: home Location provider:work  Persons participating in the virtual visit: patient, provider  I discussed the limitations of evaluation and management by telemedicine and the availability of in person appointments. The patient expressed understanding and agreed to proceed.   HPI: Overall feels very well on wellbutrin 300mg  . She has continued on zoloft 100mg .  Feels at good place and that this dose is adequate. She has changed schools and position and feels less stress. Sleeping well.   Amenorrhea resolved. Cycles are 'normal' and monthly. Due pap smear and tdap; plans to call [facility] for wom[facility] in regards to this as suspects both are up to date    ROS: See pertinent positives and negatives per HPI.    EXAM:  VITALS per patient if applicable:  GENERAL: alert, oriented, appears well and in no acute distress  HEENT: atraumatic, conjunttiva clear, no obvious abnormalities on inspection of external nose and ears  NECK: normal movements of the head and neck  LUNGS: on inspection no signs of respiratory distress, breathing rate appears normal, no obvious gross SOB, gasping or wheezing  CV: no obvious cyanosis  MS: moves all visible extremities without noticeable abnormality  PSYCH/NEURO: pleasant and cooperative, no obvious depression or anxiety, speech and thought processing grossly intact  ASSESSMENT AND PLAN:  Discussed the following assessment and plan:  Problem List Items Addressed This Visit      Other   Depression, major, single episode, mild (HCC)    Pleased improved. Patient feels current regimen very helpful. Continue wellbutrin 300mg  qam and zoloft 100 mg qhs.      Relevant Medications   buPROPion (WELLBUTRIN XL) 300 MG 24 hr tablet       -we discussed possible serious and likely etiologies, options for evaluation and workup, limitations of telemedicine visit vs in person visit, treatment, treatment risks and precautions. Pt prefers to treat via telemedicine empirically rather then risking or undertaking an in person visit at this moment.  .   I discussed the assessment and treatment plan with the patient. The patient was provided an opportunity to ask questions and all were answered. The patient agreed with the plan and demonstrated an understanding of the instructions.   The patient was advised to call back or seek an in-person evaluation if the symptoms worsen or if the condition fails to improve as anticipated.   , FNP

## 2020-05-30 ENCOUNTER — Ambulatory Visit: Payer: BC Managed Care – PPO | Admitting: Gastroenterology

## 2020-05-31 ENCOUNTER — Other Ambulatory Visit: Payer: Self-pay

## 2020-05-31 ENCOUNTER — Ambulatory Visit: Payer: BC Managed Care – PPO | Admitting: Gastroenterology

## 2020-05-31 ENCOUNTER — Encounter: Payer: Self-pay | Admitting: Gastroenterology

## 2020-05-31 VITALS — BP 127/88 | HR 109 | Temp 97.3°F | Ht 63.0 in | Wt 137.2 lb

## 2020-05-31 DIAGNOSIS — K5900 Constipation, unspecified: Secondary | ICD-10-CM | POA: Diagnosis not present

## 2020-05-31 MED ORDER — LINACLOTIDE 145 MCG PO CAPS: 145.0000 ug | ORAL_CAPSULE | Freq: Every day | ORAL | 3 refills | Status: DC

## 2020-06-01 NOTE — Progress Notes (Signed)
Alejandra Cook [address]  [address]  [place], [place] [address]  Main: [phone]  Fax: [phone]   Gastroenterology Consultation  Referring Provider:     Allegra Grana, FNP Primary Care Physician:  Allegra Grana, FNP Reason for Consultation:    Constipation        HPI:    Chief Complaint  Patient presents with  . Constipation    Patient stated that she had been suffering from constipation. Patient has diarrhea once a week.    Alejandra Cook is a 39 y.o. y/o female referred for consultation & management  by Dr. Jason Coop, Lyn Records, FNP.  Patient reports 25-month history of constipation.  Has tried several over-the-counter laxatives including daily MiraLAX, Ex-Lax and others.  States without these she goes 7 days without having a bowel movement.  Even with these she has multiple days without bowel movements. The patient denies abdominal or flank pain, anorexia, nausea or vomiting, dysphagia, or black or bloody stools or weight loss.  No family history of colon cancer.  Patient had a colonoscopy in 2007 for altered bowel habits, and positive fecal occult blood test.  Colonoscopy was normal including terminal ileum.  Patient states at that time she was having diarrhea.  Since then her bowel movements have been regular, once a day or once every other day, formed without straining, until about 6 months ago when she developed constipation.   Past Medical History:  Diagnosis Date  . Allergy    Dr. Gwen Pounds, gold and nickel allergy  . Anxiety   . Bulging discs   . Degenerative disc disease, lumbar   . Depression    off of zoloft for 1 year, had problems with depression and anxiety after last child  . Fibromyalgia    after pregnancy, Followed by [facility]  . Fibromyalgia   . NIDPOEUM(353.6)    Dr. Neale Burly at [facility]; migraines since elementary   . IBS (irritable bowel syndrome)   . Intrahepatic cholestasis of pregnancy in third trimester,  antepartum   . Spontaneous abortion in second trimester Dec 2014    Past Surgical History:  Procedure Laterality Date  . COLONOSCOPY     normal  . COLPOSCOPY  2002  . CYSTOSCOPY     ureteral stent inserted  . DILATION AND EVACUATION N/A 08/07/2013   Procedure: DILATATION AND EVACUATION;  Surgeon: Zelphia Cairo, MD;  Location: [facility];  Service: Gynecology;  Laterality: N/A;  . LAPAROSCOPY N/A 03/20/2013   Procedure: LAPAROSCOPY OPERATIVE  IUD REMOVAL;  Surgeon: Zelphia Cairo, MD;  Location: [facility];  Service: Gynecology;  Laterality: N/A;  Intraabdominal from [place]mentum  . NASAL SINUS SURGERY    . PILONIDAL CYST EXCISION    . VAGINAL DELIVERY  2011    Prior to Admission medications   Medication Sig Start Date End Date Taking? Authorizing Provider  AIMOVIG 140 MG/ML SOAJ SMARTSIG:140 Milligram(s) SUB-Q Every 4 Weeks 05/01/20  Yes [provider]  ASPERCREME LIDOCAINE 4 % PTCH SMARTSIG:1 Patch(s) Topical Every 12 Hours PRN 03/23/20  Yes [provider]  buPROPion (WELLBUTRIN XL) 300 MG 24 hr tablet Take 1 tablet (300 mg total) by mouth every morning. 05/09/20  Yes Allegra Grana, FNP  butalbital-acetaminophen-caffeine (FIORICET) 2167087917 MG tablet Take 1 tablet by mouth as needed. 08/04/19  Yes [provider]  cyclobenzaprine (FLEXERIL) 5 MG tablet Take 1 tablet (5 mg total) by mouth 3 (three) times daily as needed for muscle spasms. 02/04/19  Yes  Tracey Harries, FNP  gabapentin (NEURONTIN) 300 MG capsule Take 300 mg by mouth at bedtime.  09/10/17 05/31/20 Yes [provider]  LO LOESTRIN FE 1 MG-10 MCG / 10 MCG tablet Take 1 tablet by mouth daily. as directed 12/19/16  Yes [provider]  meloxicam (MOBIC) 7.5 MG tablet Take 7.5 mg by mouth daily. 03/23/20  Yes [provider]  methocarbamol (ROBAXIN) 500 MG tablet Take 500-1,000 mg by mouth at bedtime as needed. 03/23/20  Yes [provider]  ondansetron (ZOFRAN) 4 MG tablet TAKE  1 TABLET (4 MG TOTAL) BY MOUTH DAILY AS NEEDED FOR NAUSEA OR VOMITING. 02/15/18  Yes Arnett, Lyn Records, FNP  promethazine (PHENERGAN) 12.5 MG tablet Take 1-2 tablets (12.5-25 mg total) by mouth once as needed for up to 1 dose for nausea or vomiting. With migraines. 10/30/17  Yes McLean-Scocuzza, Pasty Spillers, MD  Rimegepant Sulfate (NURTEC) 75 MG TBDP TAKE 75 MG BY MOUTH ONCE DAILY AS NEEDED (HEADACHE) 02/26/20  Yes [provider]  rizatriptan (MAXALT) 10 MG tablet Take 1 tablet (10 mg total) by mouth as needed for migraine. May repeat in 2 hours if needed. Max dose 3 pills in 24 hours 07/25/17  Yes McLean-Scocuzza, Pasty Spillers, MD  sertraline (ZOLOFT) 100 MG tablet Take 100 mg by mouth daily. 11/26/16  Yes [provider]  SUMAtriptan (IMITREX) 100 MG tablet Take 1 tablet by mouth daily as needed. 08/18/19 08/17/20 Yes [provider]  UBRELVY 100 MG TABS Take 1 tablet by mouth daily as needed. 10/14/19  Yes [provider]  zonisamide (ZONEGRAN) 100 MG capsule take 2 tabs at hs 12/18/16  Yes [provider]  linaclotide (LINZESS) 145 MCG CAPS capsule Take 1 capsule (145 mcg total) by mouth daily. 05/31/20   Pasty Spillers, MD    Family History  Problem Relation Age of Onset  . Healthy Mother   . Hypertension Father   . Cancer Maternal Grandmother        cancer, breast  . Cancer Paternal Grandmother        breast  . Healthy Sister   . Hypertension Paternal Grandfather   . Healthy Daughter   . Healthy Son      Social History   Tobacco Use  . Smoking status: Never Smoker  . Smokeless tobacco: Never Used  Vaping Use  . Vaping Use: Never used  Substance Use Topics  . Alcohol use: No    Alcohol/week: 0.0 standard drinks  . Drug use: Never    Allergies as of 05/31/2020  . (No Known Allergies)    Review of Systems:    All systems reviewed and negative except where noted in HPI.   Physical Exam:  BP 127/88   Pulse (!) 109   Temp (!) 97.3 F  (36.3 C) (Temporal)   Ht 5\' 3"  (1.6 m)   Wt 137 lb 3.2 oz (62.2 kg)   BMI 24.30 kg/m  No LMP recorded. Psych:  Alert and cooperative. Normal mood and affect. General:   Alert,  Well-developed, well-nourished, pleasant and cooperative in NAD Head:  Normocephalic and atraumatic. Eyes:  Sclera clear, no icterus.   Conjunctiva pink. Ears:  Normal auditory acuity. Nose:  No deformity, discharge, or lesions. Mouth:  No deformity or lesions,oropharynx pink & moist. Neck:  Supple; no masses or thyromegaly. Abdomen:  Normal bowel sounds.  No bruits.  Soft, non-tender and non-distended without masses, hepatosplenomegaly or hernias noted.  No guarding or rebound tenderness.  Msk:  Symmetrical without gross deformities. Good, equal movement & strength bilaterally. Pulses:  Normal pulses noted. Extremities:  No clubbing or edema.  No cyanosis. Neurologic:  Alert and oriented x3;  grossly normal neurologically. Skin:  Intact without significant lesions or rashes. No jaundice. Lymph Nodes:  No significant cervical adenopathy. Psych:  Alert and cooperative. Normal mood and affect.   Labs: CBC    Component Value Date/Time   WBC 7.7 02/25/2020 1728   RBC 4.61 02/25/2020 1728   HGB 15.1 (H) 02/25/2020 1728   HGB 14.9 08/03/2010 1339   HCT 43.5 02/25/2020 1728   HCT 42.8 08/03/2010 1339   PLT 231 02/25/2020 1728   PLT 212 08/03/2010 1339   MCV 94.4 02/25/2020 1728   MCV 93.4 08/03/2010 1339   MCH 32.8 02/25/2020 1728   MCHC 34.7 02/25/2020 1728   RDW 11.9 02/25/2020 1728   RDW 12.4 08/03/2010 1339   LYMPHSABS 2.2 02/25/2020 1728   LYMPHSABS 1.4 08/03/2010 1339   MONOABS 0.5 02/25/2020 1728   MONOABS 0.5 08/03/2010 1339   EOSABS 0.1 02/25/2020 1728   EOSABS 0.2 08/03/2010 1339   BASOSABS 0.1 02/25/2020 1728   BASOSABS 0.0 08/03/2010 1339   CMP     Component Value Date/Time   NA 141 02/25/2020 1728   K 3.8 02/25/2020 1728   CL 108 02/25/2020 1728   CO2 24 02/25/2020 1728    GLUCOSE 101 (H) 02/25/2020 1728   BUN 15 02/25/2020 1728   CREATININE 1.03 (H) 02/25/2020 1728   CALCIUM 9.1 02/25/2020 1728   PROT 7.2 02/25/2020 1728   ALBUMIN 4.8 02/25/2020 1728   AST 17 02/25/2020 1728   ALT 12 02/25/2020 1728   ALKPHOS 62 02/25/2020 1728   BILITOT 0.7 02/25/2020 1728   GFRNONAA >60 02/25/2020 1728   GFRAA >60 02/25/2020 1728    Imaging Studies: CT scan report reviewed  Assessment and Plan:   Alejandra Cook is a 39 y.o. y/o female has been referred for constipation  Patient continues to have constipation despite introducing more fiber into her diet, and over-the-counter osmotic laxatives  She does not otherwise have any alarm symptoms and had a normal colonoscopy in 2007  High-fiber diet encouraged Increase water intake to at least 8 to 10 glasses a day  However, due to ongoing symptoms, start pharmacologic therapy.  Linzess prescribed.  Change therapy as needed  If symptoms do not improve, or new symptoms develop, may need to consider colonoscopy.  No indication for urgent colonoscopy at this time   Recent CT scan and labs otherwise reassuring   Dr Alejandra Cook  Speech recognition software was used to dictate the above note.

## 2020-06-02 ENCOUNTER — Encounter: Payer: Self-pay | Admitting: Family

## 2020-06-02 ENCOUNTER — Telehealth (INDEPENDENT_AMBULATORY_CARE_PROVIDER_SITE_OTHER): Payer: BC Managed Care – PPO | Admitting: Family

## 2020-06-02 ENCOUNTER — Other Ambulatory Visit: Payer: Self-pay

## 2020-06-02 VITALS — Ht 63.0 in | Wt 135.0 lb

## 2020-06-02 DIAGNOSIS — H6690 Otitis media, unspecified, unspecified ear: Secondary | ICD-10-CM | POA: Insufficient documentation

## 2020-06-02 MED ORDER — BENZONATATE 100 MG PO CAPS: 100.0000 mg | ORAL_CAPSULE | Freq: Two times a day (BID) | ORAL | 0 refills | Status: DC | PRN

## 2020-06-02 MED ORDER — HYDROCODONE-HOMATROPINE 5-1.5 MG/5ML PO SYRP: 5.0000 mL | ORAL_SOLUTION | Freq: Every evening | ORAL | 0 refills | Status: DC | PRN

## 2020-06-02 MED ORDER — FLUCONAZOLE 150 MG PO TABS: 150.0000 mg | ORAL_TABLET | Freq: Once | ORAL | 1 refills | Status: AC

## 2020-06-02 MED ORDER — AMOXICILLIN-POT CLAVULANATE 875-125 MG PO TABS: 1.0000 | ORAL_TABLET | Freq: Two times a day (BID) | ORAL | 0 refills | Status: DC

## 2020-06-02 NOTE — Progress Notes (Signed)
Virtual Visit via Video Note  I connected with@  on 06/02/20 at 11:30 AM EDT by a video enabled telemedicine application and verified that I am speaking with the correct person using two identifiers.  Location patient: home Location provider:work  Persons participating in the virtual visit: patient, provider  I discussed the limitations of evaluation and management by telemedicine and the availability of in person appointments. The patient expressed understanding and agreed to proceed.   HPI: Right ear pain x 5 days, unchanged.  'Feels under water.' Some pain in left ear however right ear is most bothersom. Endorses productive cough, nasal congestion.    [facility]; Covid antigen and strep antigen test negative. Son had been sick and was negative covid tested week prior.Daughter was negative for covid this week.   Has been using flonase , afrin for 3 days only with little relief. Mucinex has been the most helpful with breaking up congestion. Drinking plenty of water No ear drainage, fever, diarrhea, vomiting.  H/o sinus surgery  She is fully vaccinated for covid  She tends to get vaginal yeast infection with antibiotic  ROS: See pertinent positives and negatives per HPI.    EXAM:  VITALS per patient if applicable: Ht 5\' 3"  (1.6 m)    Wt 135 lb (61.2 kg)    BMI 23.91 kg/m  BP Readings from Last 3 Encounters:  05/31/20 127/88  02/23/20 112/64  10/07/19 109/75   Wt Readings from Last 3 Encounters:  06/02/20 135 lb (61.2 kg)  05/31/20 137 lb 3.2 oz (62.2 kg)  05/09/20 135 lb (61.2 kg)    GENERAL: alert, oriented, appears well and in no acute distress  HEENT: atraumatic, conjunttiva clear, no obvious abnormalities on inspection of external nose and ears  NECK: normal movements of the head and neck  LUNGS: on inspection no signs of respiratory distress, breathing rate appears normal, no obvious gross SOB, gasping or wheezing  CV: no obvious  cyanosis  MS: moves all visible extremities without noticeable abnormality  PSYCH/NEURO: pleasant and cooperative, no obvious depression or anxiety, speech and thought processing grossly intact  ASSESSMENT AND PLAN:  Discussed the following assessment and plan:  Problem List Items Addressed This Visit      Nervous and Auditory   Otitis media - Primary    Afebrile and nontoxic in appearance. Presentation and HPI consistent with otitis media and viral bronchitis. Start augmentin. Diflucan sent if needed. Given tessalon perles for discretionary use during daytime and tussinex if needed at night. She will let me know how she is doing.  I looked up patient on [place] Controlled Substances Reporting System PMP AWARE and saw no activity that raised concern of inappropriate use.        Relevant Medications   amoxicillin-clavulanate (AUGMENTIN) 875-125 MG tablet   fluconazole (DIFLUCAN) 150 MG tablet   benzonatate (TESSALON) 100 MG capsule   HYDROcodone-homatropine (HYCODAN) 5-1.5 MG/5ML syrup      -we discussed possible serious and likely etiologies, options for evaluation and workup, limitations of telemedicine visit vs in person visit, treatment, treatment risks and precautions. Pt prefers to treat via telemedicine empirically rather then risking or undertaking an in person visit at this moment.  .   I discussed the assessment and treatment plan with the patient. The patient was provided an opportunity to ask questions and all were answered. The patient agreed with the plan and demonstrated an understanding of the instructions.   The patient was advised  to call back or seek an in-person evaluation if the symptoms worsen or if the condition fails to improve as anticipated.   Rennie Plowman, FNP

## 2020-06-02 NOTE — Patient Instructions (Addendum)
Start augmentin ( antibiotic)  If you develop yeast infection , you may use diflucan.   Ensure to take probiotics while on antibiotics and also for 2 weeks after completion. It is important to re-colonize the gut with good bacteria and also to prevent any diarrheal infections associated with antibiotic use.   You may use tessalon perles as needed during day for cough  But NOT around the clock as we discussed. You may use Tussionex cough syrup at bedtime; as discussed this has a narcotic in it so STORE IN A SAFE PLACE, away from children. Do you use with other sedating medications or alcohol.   Let me know if you dont start feel to better

## 2020-06-02 NOTE — Assessment & Plan Note (Signed)
Afebrile and nontoxic in appearance. Presentation and HPI consistent with otitis media and viral bronchitis. Start augmentin. Diflucan sent if needed. Given tessalon perles for discretionary use during daytime and tussinex if needed at night. She will let me know how she is doing.  I looked up patient on [place] Controlled Substances Reporting System PMP AWARE and saw no activity that raised concern of inappropriate use.

## 2020-06-06 ENCOUNTER — Other Ambulatory Visit: Payer: Self-pay

## 2020-06-06 ENCOUNTER — Ambulatory Visit
Admission: RE | Admit: 2020-06-06 | Discharge: 2020-06-06 | Disposition: A | Discharge status: Home / Self Care | Payer: BC Managed Care – PPO | Source: Ambulatory Visit | Attending: Emergency Medicine | Admitting: Emergency Medicine

## 2020-06-06 VITALS — BP 121/85 | HR 90 | Temp 98.6°F | Resp 19

## 2020-06-06 DIAGNOSIS — J0110 Acute frontal sinusitis, unspecified: Secondary | ICD-10-CM

## 2020-06-06 DIAGNOSIS — Z20822 Contact with and (suspected) exposure to covid-19: Secondary | ICD-10-CM

## 2020-06-06 DIAGNOSIS — R059 Cough, unspecified: Secondary | ICD-10-CM | POA: Diagnosis not present

## 2020-06-06 NOTE — ED Triage Notes (Addendum)
Pt presents with complaints of cough, chest congestion, and ear pressure. Pt tested negative for covid and strep, both rapid only, last week. Pt was treated for an ear infection over an e-visit and is currently taking amoxicillin. Has continued to have a cough and wanted to be evaluated in person. Pt denies any fever or shortness of breath.

## 2020-06-06 NOTE — ED Provider Notes (Signed)
[facility]    CSN: [id number] Arrival date & time: 06/06/20  1440      History   Chief Complaint Chief Complaint  Patient presents with  . Appointment    3  . Cough    HPI Alejandra Cook is a 39 y.o. female.   Patient presents with nonproductive cough x1.5 weeks.  She also reports chest congestion and ear pressure.  She denies fever, chills, shortness of breath, vomiting, diarrhea, or other symptoms.  [facility]; rapid COVID and rapid strep negative; treated symptomatically.  She then had a video visit with her PCP on 06/02/2020; diagnosed with acute otitis media; treated with Augmentin, Diflucan, Tessalon Perles, Hycodan syrup.  Her medical history includes migraine headaches, fibromyalgia, DDD, fatigue, insomnia, IBS, depression, low back pain.  The history is provided by the patient and medical records.    Past Medical History:  Diagnosis Date  . Allergy    Dr. Gwen PoundsKowalski, gold and nickel allergy  . Anxiety   . Bulging discs   . Degenerative disc disease, lumbar   . Depression    off of zoloft for 1 year, had problems with depression and anxiety after last child  . Fibromyalgia    after pregnancy, Followed by [facility]  . Fibromyalgia   . XBJYNWGN(562.1Headache(784.0)    Dr. Neale [facility]lyFreeman at [facility]; migraines since elementary   . IBS (irritable bowel syndrome)   . 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 Spontaneous abortion in second trimester Dec 2014    Patient Active Problem List   Diagnosis Date Noted  . Otitis media 06/02/2020  . Low back pain 02/23/2020  . Amenorrhea 01/25/2020  . Depression, major, single episode, mild (HCC) 11/23/2019  . Intractable migraine with aura without status migrainosus 11/07/2018  . Contusion of left hand 09/09/2018  . Polycythemia 12/01/2017  . History of migraine 12/20/2016  . Other fatigue 12/20/2016  . Primary insomnia 12/20/2016  . History of  IBS 12/20/2016  . History of renal calculi 12/20/2016  . History of sinusitis 12/20/2016  . Bug bites 01/23/2016  . Bronchitis 08/11/2014  . Routine general medical examination at a health care facility 01/21/2013  . Fibromyalgia 01/17/2012  . Allergic rhinitis 01/17/2012  . Migraine headache 05/12/2007  . DDD (degenerative disc disease), lumbosacral 05/12/2007    Past Surgical History:  Procedure Laterality Date  . COLONOSCOPY     normal  . COLPOSCOPY  2002  . CYSTOSCOPY     ureteral stent inserted  . DILATION AND EVACUATION N/A 08/07/2013   Procedure: DILATATION AND EVACUATION;  Surgeon: Zelphia CairoGretchen Adkins, MD;  Location: [facility];  Service: Gynecology;  Laterality: N/A;  . LAPAROSCOPY N/A 03/20/2013   Procedure: LAPAROSCOPY OPERATIVE  IUD REMOVAL;  Surgeon: Zelphia CairoGretchen Adkins, MD;  Location: [facility];  Service: Gynecology;  Laterality: N/A;  Intraabdominal from [place]  . NASAL SINUS SURGERY    . PILONIDAL CYST EXCISION    . VAGINAL DELIVERY  2011    OB History    Gravida  4   Para  2   Term  1   Preterm  1   AB  2   Living  2     SAB  2   TAB      Ectopic      Multiple  0   Live Births  2            Home Medications  Prior to Admission medications   Medication Sig Start Date End Date Taking? Authorizing Provider  AIMOVIG 140 MG/ML SOAJ SMARTSIG:140 Milligram(s) SUB-Q Every 4 Weeks 05/01/20   [provider]  amoxicillin-clavulanate (AUGMENTIN) 875-125 MG tablet Take 1 tablet by mouth 2 (two) times daily. 06/02/20   Allegra Grana, FNP  ASPERCREME LIDOCAINE 4 % PTCH SMARTSIG:1 Patch(s) Topical Every 12 Hours PRN 03/23/20   [provider]  benzonatate (TESSALON) 100 MG capsule Take 1 capsule (100 mg total) by mouth 2 (two) times daily as needed for cough. 06/02/20   Allegra Grana, FNP  buPROPion (WELLBUTRIN XL) 300 MG 24 hr tablet Take 1 tablet (300 mg total) by mouth every morning. 05/09/20   Allegra Grana, FNP    butalbital-acetaminophen-caffeine (FIORICET) [phone] MG tablet Take 1 tablet by mouth as needed. 08/04/19   [provider]  cyclobenzaprine (FLEXERIL) 5 MG tablet Take 1 tablet (5 mg total) by mouth 3 (three) times daily as needed for muscle spasms. 02/04/19   Tracey Harries, FNP  gabapentin (NEURONTIN) 300 MG capsule Take 300 mg by mouth at bedtime.  09/10/17 06/02/20  [provider]  HYDROcodone-homatropine (HYCODAN) 5-1.5 MG/5ML syrup Take 5 mLs by mouth at bedtime as needed for cough. 06/02/20   Allegra Grana, FNP  linaclotide (LINZESS) 145 MCG CAPS capsule Take 1 capsule (145 mcg total) by mouth daily. 05/31/20   Tahiliani, Dolphus Jenny, MD  LO LOESTRIN FE 1 MG-10 MCG / 10 MCG tablet Take 1 tablet by mouth daily. as directed 12/19/16   [provider]  meloxicam (MOBIC) 7.5 MG tablet Take 7.5 mg by mouth daily. 03/23/20   [provider]  ondansetron (ZOFRAN) 4 MG tablet TAKE 1 TABLET (4 MG TOTAL) BY MOUTH DAILY AS NEEDED FOR NAUSEA OR VOMITING. 02/15/18   Allegra Grana, FNP  promethazine (PHENERGAN) 12.5 MG tablet Take 1-2 tablets (12.5-25 mg total) by mouth once as needed for up to 1 dose for nausea or vomiting. With migraines. 10/30/17   McLean-Scocuzza, Pasty Spillers, MD  Rimegepant Sulfate (NURTEC) 75 MG TBDP TAKE 75 MG BY MOUTH ONCE DAILY AS NEEDED (HEADACHE) 02/26/20   [provider]  rizatriptan (MAXALT) 10 MG tablet Take 1 tablet (10 mg total) by mouth as needed for migraine. May repeat in 2 hours if needed. Max dose 3 pills in 24 hours 07/25/17   McLean-Scocuzza, Pasty Spillers, MD  sertraline (ZOLOFT) 100 MG tablet Take 100 mg by mouth daily. 11/26/16   [provider]  SUMAtriptan (IMITREX) 100 MG tablet Take 1 tablet by mouth daily as needed. 08/18/19 08/17/20  [provider]  zonisamide (ZONEGRAN) 100 MG capsule take 2 tabs at hs 12/18/16   [provider]    Family History Family History  Problem Relation Age of Onset   . Healthy Mother   . Hypertension Father   . Cancer Maternal Grandmother        cancer, breast  . Cancer Paternal Grandmother        breast  . Healthy Sister   . Hypertension Paternal Grandfather   . Healthy Daughter   . Healthy Son     Social History Social History   Tobacco Use  . Smoking status: Never Smoker  . Smokeless tobacco: Never Used  Vaping Use  . Vaping Use: Never used  Substance Use Topics  . Alcohol use: No    Alcohol/week: 0.0 standard drinks  . Drug use: Never     Allergies  Patient has no known allergies.   Review of Systems Review of Systems  Constitutional: Negative for chills and fever.  HENT: Positive for congestion and ear pain. Negative for sore throat.   Eyes: Negative for pain and visual disturbance.  Respiratory: Positive for cough. Negative for shortness of breath.   Cardiovascular: Negative for chest pain and palpitations.  Gastrointestinal: Negative for abdominal pain, diarrhea and vomiting.  Genitourinary: Negative for dysuria and hematuria.  Musculoskeletal: Negative for arthralgias and back pain.  Skin: Negative for color change and rash.  Neurological: Negative for seizures and syncope.  All other systems reviewed and are negative.    Physical Exam Triage Vital Signs ED Triage Vitals  Enc Vitals Group     BP      Pulse      Resp      Temp      Temp src      SpO2      Weight      Height      Head Circumference      Peak Flow      Pain Score      Pain Loc      Pain Edu?      Excl. in GC?    No data found.  Updated Vital Signs BP 121/85   Pulse 90   Temp 98.6 F (37 C)   Resp 19   LMP 06/06/2020   SpO2 98%   Visual Acuity Right Eye Distance:   Left Eye Distance:   Bilateral Distance:    Right Eye Near:   Left Eye Near:    Bilateral Near:     Physical Exam Vitals and nursing note reviewed.  Constitutional:      General: She is not in acute distress.    Appearance: She is well-developed. She is  not ill-appearing.  HENT:     Head: Normocephalic and atraumatic.     Right Ear: Tympanic membrane normal.     Left Ear: Tympanic membrane normal.     Nose: Nose normal.     Mouth/Throat:     Mouth: Mucous membranes are moist.     Pharynx: Oropharynx is clear.  Eyes:     Conjunctiva/sclera: Conjunctivae normal.  Cardiovascular:     Rate and Rhythm: Normal rate and regular rhythm.     Heart sounds: Normal heart sounds.  Pulmonary:     Effort: Pulmonary effort is normal. No respiratory distress.     Breath sounds: Normal breath sounds. No wheezing or rhonchi.  Abdominal:     Palpations: Abdomen is soft.     Tenderness: There is no abdominal tenderness. There is no guarding or rebound.  Musculoskeletal:     Cervical back: Neck supple.  Skin:    General: Skin is warm and dry.     Findings: No rash.  Neurological:     General: No focal deficit present.     Mental Status: She is alert and oriented to person, place, and time.     Gait: Gait normal.  Psychiatric:        Mood and Affect: Mood normal.        Behavior: Behavior normal.      UC Treatments / Results  Labs (all labs ordered are listed, but only abnormal results are displayed) Labs Reviewed  NOVEL CORONAVIRUS, NAA    EKG   Radiology No results found.  Procedures Procedures (including critical care time)  Medications Ordered in UC Medications - No data to  display  Initial Impression / Assessment and Plan / UC Course  I have reviewed the triage vital signs and the nursing notes.  Pertinent labs & imaging results that were available during my care of the patient were reviewed by me and considered in my medical decision making (see chart for details).   Cough, Sinusitis.  Instructed patient to finish taking the Augmentin as prescribed by her PCP.  PCR COVID pending.  Instructed patient to self quarantine until the test result is back.  Discussed symptomatic treatment including Tylenol, rest, hydration.   Instructed patient to follow up with PCP if her symptoms are not improving  Patient agrees to plan of care.    Final Clinical Impressions(s) / UC Diagnoses   Final diagnoses:  Encounter for screening laboratory testing for COVID-19 virus  Cough  Acute non-recurrent frontal sinusitis     Discharge Instructions     Your COVID test is pending.  You should self quarantine until the test result is back.    Take Tylenol as needed for fever or discomfort.  Rest and keep yourself hydrated.    Go to the emergency department if you develop acute worsening symptoms.        ED Prescriptions    None     I have reviewed the PDMP during this encounter.   Mickie Bail, NP 06/06/20 1506

## 2020-06-06 NOTE — Discharge Instructions (Signed)
Your COVID test is pending.  You should self quarantine until the test result is back.    Take Tylenol as needed for fever or discomfort.  Rest and keep yourself hydrated.    Go to the emergency department if you develop acute worsening symptoms.     

## 2020-06-07 LAB — NOVEL CORONAVIRUS, NAA: SARS-CoV-2, NAA: NOT DETECTED

## 2020-06-07 LAB — SARS-COV-2, NAA 2 DAY TAT

## 2020-06-19 ENCOUNTER — Ambulatory Visit: Payer: Self-pay

## 2020-06-21 ENCOUNTER — Telehealth: Payer: Self-pay | Admitting: Family

## 2020-06-21 NOTE — Telephone Encounter (Signed)
FYI I spoke with patient with patient who stated that her HR has been from 105-142 the last hour. She has been trying to rest, but HR stays at a high rate. She can tell her heart is racing, but has no other sx, no chest pain, SOB. She was wanting an appointment here, but I let her know that she needed to be seen ASAP & our office had nothing at this time. She as going to [facility], but they now close at 4p so will go to [facility].

## 2020-06-21 NOTE — Telephone Encounter (Signed)
Pt called her pulse is 142 and rising and staying that way for the last hour and half

## 2020-06-22 ENCOUNTER — Ambulatory Visit: Payer: Self-pay

## 2020-06-22 ENCOUNTER — Other Ambulatory Visit: Payer: Self-pay

## 2020-06-22 NOTE — Telephone Encounter (Signed)
Spoke with patient and she was able to be seen at [facility]. They did an echo while see was there and they are referring her to cardiology. She does not have an appt yet but will hopefully be called this week for an appt. She is scheduled to see you on 06/27/20 and wants to keep that appt.

## 2020-06-22 NOTE — Telephone Encounter (Signed)
Call pt I wanted to ensure she was seen at urgent care, I cannot see full notes Please offer f/u appt here

## 2020-06-24 NOTE — Telephone Encounter (Signed)
Seen by dr Lady Gary yesterday

## 2020-06-26 DIAGNOSIS — R002 Palpitations: Secondary | ICD-10-CM | POA: Insufficient documentation

## 2020-06-27 ENCOUNTER — Ambulatory Visit: Payer: BC Managed Care – PPO | Admitting: Gastroenterology

## 2020-06-27 ENCOUNTER — Ambulatory Visit: Payer: BC Managed Care – PPO | Admitting: Family

## 2020-07-05 ENCOUNTER — Other Ambulatory Visit: Payer: Self-pay

## 2020-07-05 MED ORDER — LINACLOTIDE 290 MCG PO CAPS: 290.0000 ug | ORAL_CAPSULE | Freq: Every day | ORAL | 2 refills | Status: DC

## 2020-07-21 DIAGNOSIS — R000 Tachycardia, unspecified: Secondary | ICD-10-CM | POA: Insufficient documentation

## 2020-07-26 ENCOUNTER — Ambulatory Visit: Payer: Self-pay

## 2020-07-27 ENCOUNTER — Ambulatory Visit: Payer: Self-pay

## 2020-08-05 ENCOUNTER — Ambulatory Visit
Admission: RE | Admit: 2020-08-05 | Discharge: 2020-08-05 | Disposition: A | Discharge status: Home / Self Care | Payer: BC Managed Care – PPO | Source: Ambulatory Visit | Attending: Internal Medicine | Admitting: Internal Medicine

## 2020-08-05 VITALS — BP 117/81 | HR 94 | Temp 98.8°F | Resp 16

## 2020-08-05 DIAGNOSIS — B349 Viral infection, unspecified: Secondary | ICD-10-CM | POA: Diagnosis present

## 2020-08-05 LAB — POCT RAPID STREP A (OFFICE): Rapid Strep A Screen: NEGATIVE

## 2020-08-05 NOTE — Discharge Instructions (Addendum)
Until we get your test resuls, you may take the following supplements to help your immune system be stronger to fight this viral infection Take Quarcetin 500 mg three times a day x 7 days with Zinc 50 mg ones a day x 7 days. The quarcetin is an antiviral and anti-inflammatory supplement which helps open the zinc channels in the cell to absorb Zinc. Zinc helps decrease the virus load in your body. Take Melatonin 6-10 mg at bed time which also helps support your immune system.  Also make sure to take Vit D 5,000 IU per day with a fatty meal and Vit C 5000 mg a day until you are completely better. To prevent viral illnesses your vitamin D should be between 60-80. Stay on Vitamin D 2,000  and C  1000 mg the rest of the season.  Don't lay around, keep active and walk as much as you are able to to prevent worsening of your symptoms.  Follow up with your family Dr next week.  If you get short of breath and you are able to check  your oxygen with a pulse oxygen meter, if it gets to 92% or less, you need to go to the hospital to be admitted. If you dont have one, come back here and we will assess you.

## 2020-08-05 NOTE — ED Triage Notes (Signed)
Patient presents to Urgent Care with complaints of headache, sinus pressure, bilateral ear pressure, and sore throat since Tuesday. Treating with tylenol sinus last dose 1100.  Denies fever.

## 2020-08-05 NOTE — ED Provider Notes (Signed)
[name]    CSN: [id number] Arrival date & time: 08/05/20  1521      History   Chief Complaint Chief Complaint  Patient presents with   Sore Throat   Headache    HPI Alejandra Cook is a 39 y.o. female who presents due top having HA, sinus pressure, bilateral ear pressure, ST x 4 days. Has been taking Tylenol sinus and last dose was 11 am today.     Past Medical History:  Diagnosis Date   Allergy    Dr. Gwen Pounds, gold and nickel allergy   Anxiety    Bulging discs    Degenerative disc disease, lumbar    Depression    off of zoloft for 1 year, had problems with depression and anxiety after last child   Fibromyalgia    after pregnancy, Followed by [facility]   Fibromyalgia    Headache(784.0)    Dr. Neale Burly at [facility]; migraines since elementary    IBS (irritable bowel syndrome)    Intrahepatic cholestasis of pregnancy in third trimester, antepartum    Spontaneous abortion in second trimester Dec 2014    Patient Active Problem List   Diagnosis Date Noted   Otitis media 06/02/2020   Low back pain 02/23/2020   Amenorrhea 01/25/2020   Depression, major, single episode, mild (HCC) 11/23/2019   Intractable migraine with aura without status migrainosus 11/07/2018   Contusion of left hand 09/09/2018   Polycythemia 12/01/2017   History of migraine 12/20/2016   Other fatigue 12/20/2016   Primary insomnia 12/20/2016   History of IBS 12/20/2016   History of renal calculi 12/20/2016   History of sinusitis 12/20/2016   Bug bites 01/23/2016   Bronchitis 08/11/2014   Routine general medical examination at a health care facility 01/21/2013   Fibromyalgia 01/17/2012   Allergic rhinitis 01/17/2012   Migraine headache 05/12/2007   DDD (degenerative disc disease), lumbosacral 05/12/2007    Past Surgical History:  Procedure Laterality Date   COLONOSCOPY     normal   COLPOSCOPY  2002   CYSTOSCOPY      ureteral stent inserted   DILATION AND EVACUATION N/A 08/07/2013   Procedure: DILATATION AND EVACUATION;  Surgeon: Zelphia Cairo, MD;  Location: [facility];  Service: Gynecology;  Laterality: N/A;   LAPAROSCOPY N/A 03/20/2013   Procedure: LAPAROSCOPY OPERATIVE  IUD REMOVAL;  Surgeon: Zelphia Cairo, MD;  Location: [facility];  Service: Gynecology;  Laterality: N/A;  Intraabdominal from [facility]   NASAL SINUS SURGERY     PILONIDAL CYST EXCISION     VAGINAL DELIVERY  2011    OB History    Gravida  4   Para  2   Term  1   Preterm  1   AB  2   Living  2     SAB  2   IAB      Ectopic      Multiple  0   Live Births  2            Home Medications    Prior to Admission medications   Medication Sig Start Date End Date Taking? Authorizing Provider  AIMOVIG 140 MG/ML SOAJ SMARTSIG:140 Milligram(s) SUB-Q Every 4 Weeks 05/01/20   [provider]  ASPERCREME LIDOCAINE 4 % PTCH SMARTSIG:1 Patch(s) Topical Every 12 Hours PRN 03/23/20   [provider]  buPROPion (WELLBUTRIN XL) 300 MG 24 hr tablet Take 1 tablet (300 mg total) by mouth every morning. 05/09/20   Rennie Plowman  G, FNP  butalbital-acetaminophen-caffeine (FIORICET) 50-325-40 MG tablet Take 1 tablet by mouth as needed. 08/04/19   [provider]  cyclobenzaprine (FLEXERIL) 5 MG tablet Take 1 tablet (5 mg total) by mouth 3 (three) times daily as needed for muscle spasms. 02/04/19   Tracey Harries, FNP  gabapentin (NEURONTIN) 300 MG capsule Take 300 mg by mouth at bedtime.  09/10/17 06/02/20  [provider]  linaclotide (LINZESS) 290 MCG CAPS capsule Take 1 capsule (290 mcg total) by mouth daily. 07/05/20   Tahiliani, Dolphus Jenny, MD  LO LOESTRIN FE 1 MG-10 MCG / 10 MCG tablet Take 1 tablet by mouth daily. as directed 12/19/16   [provider]  meloxicam (MOBIC) 7.5 MG tablet Take 7.5 mg by mouth daily. 03/23/20   [provider]  ondansetron (ZOFRAN) 4 MG tablet TAKE 1 TABLET  (4 MG TOTAL) BY MOUTH DAILY AS NEEDED FOR NAUSEA OR VOMITING. 02/15/18   Allegra Grana, FNP  promethazine (PHENERGAN) 12.5 MG tablet Take 1-2 tablets (12.5-25 mg total) by mouth once as needed for up to 1 dose for nausea or vomiting. With migraines. 10/30/17   McLean-Scocuzza, Pasty Spillers, MD  Rimegepant Sulfate (NURTEC) 75 MG TBDP TAKE 75 MG BY MOUTH ONCE DAILY AS NEEDED (HEADACHE) 02/26/20   [provider]  rizatriptan (MAXALT) 10 MG tablet Take 1 tablet (10 mg total) by mouth as needed for migraine. May repeat in 2 hours if needed. Max dose 3 pills in 24 hours 07/25/17   McLean-Scocuzza, Pasty Spillers, MD  sertraline (ZOLOFT) 100 MG tablet Take 100 mg by mouth daily. 11/26/16   [provider]  SUMAtriptan (IMITREX) 100 MG tablet Take 1 tablet by mouth daily as needed. 08/18/19 08/17/20  [provider]  zonisamide (ZONEGRAN) 100 MG capsule take 2 tabs at hs 12/18/16   [provider]    Family History Family History  Problem Relation Age of Onset   Healthy Mother    Hypertension Father    Cancer Maternal Grandmother        cancer, breast   Cancer Paternal Grandmother        breast   Healthy Sister    Hypertension Paternal Grandfather    Healthy Daughter    Healthy Son     Social History Social History   Tobacco Use   Smoking status: Never Smoker   Smokeless tobacco: Never Used  Building services engineer Use: Never used  Substance Use Topics   Alcohol use: No    Alcohol/week: 0.0 standard drinks   Drug use: Never     Allergies   Patient has no known allergies.   Review of Systems Review of Systems  Constitutional: Positive for fatigue. Negative for appetite change, chills and diaphoresis.  HENT: Positive for congestion, ear pain, sinus pressure and sinus pain. Negative for ear discharge, sore throat and trouble swallowing.   Eyes: Negative for discharge.  Respiratory: Positive for cough. Negative for shortness of breath.   Skin:  Negative for rash.  Neurological: Positive for headaches.     Physical Exam Triage Vital Signs ED Triage Vitals  Enc Vitals Group     BP 08/05/20 1525 117/81     Pulse Rate 08/05/20 1525 94     Resp 08/05/20 1525 16     Temp 08/05/20 1525 98.8 F (37.1 C)     Temp Source 08/05/20 1525 Oral     SpO2 08/05/20 1525 97 %     Weight --  Height --      Head Circumference --      Peak Flow --      Pain Score 08/05/20 1527 5     Pain Loc --      Pain Edu? --      Excl. in GC? --    No data found.  Updated Vital Signs BP 117/81 (BP Location: Left Arm)    Pulse 94    Temp 98.8 F (37.1 C) (Oral)    Resp 16    LMP  (Within Months) Comment: Mid november    SpO2 97%   Visual Acuity Right Eye Distance:   Left Eye Distance:   Bilateral Distance:    Right Eye Near:   Left Eye Near:    Bilateral Near:     Physical Exam Physical Exam Vitals signs and nursing note reviewed.  Constitutional:      General: She is not in acute distress.    Appearance: Normal appearance. She is not ill-appearing, toxic-appearing or diaphoretic.  HENT:     Head: Normocephalic.     Right Ear: Tympanic membrane, ear canal and external ear normal.     Left Ear: Tympanic membrane, ear canal and external ear normal.     Nose: Nose normal.     Mouth/Throat:     Mouth: Mucous membranes are moist.  Eyes:     General: No scleral icterus.       Right eye: No discharge.        Left eye: No discharge.     Conjunctiva/sclera: Conjunctivae normal.  Neck:     Musculoskeletal: Neck supple. No neck rigidity.  Cardiovascular:     Rate and Rhythm: Normal rate and regular rhythm.     Heart sounds: No murmur.  Pulmonary:     Effort: Pulmonary effort is normal.     Breath sounds: Normal breath sounds.  t.  Musculoskeletal: Normal range of motion.  Lymphadenopathy:     Cervical: No cervical adenopathy.  Skin:    General: Skin is warm and dry.     Coloration: Skin is not jaundiced.     Findings: No rash.   Neurological:     Mental Status: She is alert and oriented to person, place, and time.     Gait: Gait normal.  Psychiatric:        Mood and Affect: Mood normal.        Behavior: Behavior normal.        Thought Content: Thought content normal.        Judgment: Judgment normal.      UC Treatments / Results  Labs (all labs ordered are listed, but only abnormal results are displayed) Labs Reviewed  CULTURE, GROUP A STREP ([facility])  NOVEL CORONAVIRUS, NAA  POCT RAPID STREP A (OFFICE)  Rapid strep neg.   EKG   Radiology No results found.  Procedures Procedures (including critical care time)  Medications Ordered in UC Medications - No data to display  Initial Impression / Assessment and Plan / UC Course  I have reviewed the triage vital signs and the nursing notes. Viral illness. Covid and Flu are pending. Supportive care advised.  Final Clinical Impressions(s) / UC Diagnoses   Final diagnoses:  Viral illness     Discharge Instructions     Until we get your test resuls, you may take the following supplements to help your immune system be stronger to fight this viral infection Take Quarcetin 500 mg three times a day  x 7 days with Zinc 50 mg ones a day x 7 days. The quarcetin is an antiviral and anti-inflammatory supplement which helps open the zinc channels in the cell to absorb Zinc. Zinc helps decrease the virus load in your body. Take Melatonin 6-10 mg at bed time which also helps support your immune system.  Also make sure to take Vit D 5,000 IU per day with a fatty meal and Vit C 5000 mg a day until you are completely better. To prevent viral illnesses your vitamin D should be between 60-80. Stay on Vitamin D 2,000  and C  1000 mg the rest of the season.  Don't lay around, keep active and walk as much as you are able to to prevent worsening of your symptoms.  Follow up with your family Dr next week.  If you get short of breath and you are able to check  your oxygen with  a pulse oxygen meter, if it gets to 92% or less, you need to go to the hospital to be admitted. If you dont have one, come back here and we will assess you.      ED Prescriptions    None     PDMP not reviewed this encounter.   Garey HamRodriguez-Southworth, Bibi Economos, PA-C 08/06/20 2345

## 2020-08-08 LAB — CULTURE, GROUP A STREP (THRC)

## 2020-08-09 LAB — NOVEL CORONAVIRUS, NAA: SARS-CoV-2, NAA: NOT DETECTED

## 2020-09-08 ENCOUNTER — Other Ambulatory Visit: Payer: Self-pay | Admitting: Family

## 2020-09-08 DIAGNOSIS — F320 Major depressive disorder, single episode, mild: Secondary | ICD-10-CM

## 2020-09-27 ENCOUNTER — Other Ambulatory Visit: Payer: Self-pay | Admitting: Gastroenterology

## 2020-09-27 NOTE — Telephone Encounter (Signed)
Last office visit 05/31/2020 Constipation  Patient is on Linzess 

## 2020-09-29 ENCOUNTER — Other Ambulatory Visit: Payer: Self-pay | Admitting: Gastroenterology

## 2020-10-19 ENCOUNTER — Other Ambulatory Visit: Payer: Self-pay

## 2020-10-19 MED ORDER — TRULANCE 3 MG PO TABS: 1.0000 | ORAL_TABLET | Freq: Every day | ORAL | 0 refills | Status: DC

## 2020-11-21 ENCOUNTER — Ambulatory Visit
Admission: EM | Admit: 2020-11-21 | Discharge: 2020-11-21 | Disposition: A | Discharge status: Home / Self Care | Payer: BC Managed Care – PPO

## 2020-11-21 DIAGNOSIS — H9202 Otalgia, left ear: Secondary | ICD-10-CM

## 2020-11-21 DIAGNOSIS — H6982 Other specified disorders of Eustachian tube, left ear: Secondary | ICD-10-CM

## 2020-11-21 NOTE — Discharge Instructions (Signed)
Take ibuprofen and plain over-the-counter Mucinex as directed.    Follow up with your primary care provider or an Ear, Nose, & Throat specialist if your symptoms are not improving.

## 2020-11-21 NOTE — ED Provider Notes (Signed)
[facility]    CSN: [id number] Arrival date & time: 11/21/20  1420      History   Chief Complaint Chief Complaint  Patient presents with  . Otalgia    HPI Alejandra Cook is a 40 y.o. female.   Patient presents with 1 day history of left ear pain.  She states she had runny nose and nasal congestion 3 to 4 days ago but the symptoms resolved.  She denies fever, chills, rash, sore throat, cough, shortness of breath, vomiting, diarrhea, or other symptoms.  Treatment attempted at home with ibuprofen.  Her medical history includes degenerative disc disease, migraine headache, IBS, fibromyalgia, depression, anxiety.  The history is provided by the patient and medical records.    Past Medical History:  Diagnosis Date  . Allergy    Dr. Gwen PoundsKowalski, gold and nickel allergy  . Anxiety   . Bulging discs   . Degenerative disc disease, lumbar   . Depression    off of zoloft for 1 year, had problems with depression and anxiety after last child  . Fibromyalgia    after pregnancy, Followed by [facility]  . Fibromyalgia   . WGNFAOZH(086.5Headache(784.0)    Dr. Neale [facility]lyFreeman at [facility]; migraines since elementary   . IBS (irritable bowel syndrome)   . 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 Spontaneous abortion in second trimester Dec 2014    Patient Active Problem List   Diagnosis Date Noted  . Otitis media 06/02/2020  . Low back pain 02/23/2020  . Amenorrhea 01/25/2020  . Depression, major, single episode, mild (HCC) 11/23/2019  . Intractable migraine with aura without status migrainosus 11/07/2018  . Contusion of left hand 09/09/2018  . Polycythemia 12/01/2017  . History of migraine 12/20/2016  . Other fatigue 12/20/2016  . Primary insomnia 12/20/2016  . History of IBS 12/20/2016  . History of renal calculi 12/20/2016  . History of sinusitis 12/20/2016  . Bug bites 01/23/2016  . Bronchitis 08/11/2014  . Routine general medical examination  at a health care facility 01/21/2013  . Fibromyalgia 01/17/2012  . Allergic rhinitis 01/17/2012  . Migraine headache 05/12/2007  . DDD (degenerative disc disease), lumbosacral 05/12/2007    Past Surgical History:  Procedure Laterality Date  . COLONOSCOPY     normal  . COLPOSCOPY  2002  . CYSTOSCOPY     ureteral stent inserted  . DILATION AND EVACUATION N/A 08/07/2013   Procedure: DILATATION AND EVACUATION;  Surgeon: Zelphia CairoGretchen Adkins, MD;  Location: [facility];  Service: Gynecology;  Laterality: N/A;  . LAPAROSCOPY N/A 03/20/2013   Procedure: LAPAROSCOPY OPERATIVE  IUD REMOVAL;  Surgeon: Zelphia CairoGretchen Adkins, MD;  Location: [facility];  Service: Gynecology;  Laterality: N/A;  Intraabdominal from [place]  . NASAL SINUS SURGERY    . PILONIDAL CYST EXCISION    . VAGINAL DELIVERY  2011    OB History    Gravida  4   Para  2   Term  1   Preterm  1   AB  2   Living  2     SAB  2   IAB      Ectopic      Multiple  0   Live Births  2            Home Medications    Prior to Admission medications   Medication Sig Start Date End Date Taking? Authorizing Provider  AIMOVIG 140 MG/ML SOAJ SMARTSIG:140 Milligram(s) SUB-Q Every 4 Weeks 05/01/20  [provider]  ASPERCREME LIDOCAINE 4 % PTCH SMARTSIG:1 Patch(s) Topical Every 12 Hours PRN 03/23/20   [provider]  buPROPion (WELLBUTRIN XL) 150 MG 24 hr tablet START WITH TAKING 1 TABLET BY MOUTH EACH AM, THEN INCREASE AFTER 3 DAYS TO 2 TABLETS (300MG ) EACH AM 09/08/20   11/06/20, FNP  buPROPion (WELLBUTRIN XL) 300 MG 24 hr tablet Take 1 tablet (300 mg total) by mouth every morning. 05/09/20   07/09/20, FNP  butalbital-acetaminophen-caffeine (FIORICET) [phone] MG tablet Take 1 tablet by mouth as needed. 08/04/19   [provider]  cyclobenzaprine (FLEXERIL) 5 MG tablet Take 1 tablet (5 mg total) by mouth 3 (three) times daily as needed for muscle spasms. 02/04/19   04/07/19, FNP  gabapentin  (NEURONTIN) 300 MG capsule Take 300 mg by mouth at bedtime.  09/10/17 06/02/20  [provider]  LO LOESTRIN FE 1 MG-10 MCG / 10 MCG tablet Take 1 tablet by mouth daily. as directed 12/19/16   [provider]  meloxicam (MOBIC) 7.5 MG tablet Take 7.5 mg by mouth daily. 03/23/20   [provider]  ondansetron (ZOFRAN) 4 MG tablet TAKE 1 TABLET (4 MG TOTAL) BY MOUTH DAILY AS NEEDED FOR NAUSEA OR VOMITING. 02/15/18   02/17/18, FNP  Plecanatide (TRULANCE) 3 MG TABS Take 1 tablet by mouth daily. 10/19/20   10/21/20, MD  promethazine (PHENERGAN) 12.5 MG tablet Take 1-2 tablets (12.5-25 mg total) by mouth once as needed for up to 1 dose for nausea or vomiting. With migraines. 10/30/17   McLean-Scocuzza, 11/01/17, MD  Rimegepant Sulfate (NURTEC) 75 MG TBDP TAKE 75 MG BY MOUTH ONCE DAILY AS NEEDED (HEADACHE) 02/26/20   [provider]  rizatriptan (MAXALT) 10 MG tablet Take 1 tablet (10 mg total) by mouth as needed for migraine. May repeat in 2 hours if needed. Max dose 3 pills in 24 hours 07/25/17   McLean-Scocuzza, 07/27/17, MD  sertraline (ZOLOFT) 100 MG tablet Take 100 mg by mouth daily. 11/26/16   [provider]  zonisamide (ZONEGRAN) 100 MG capsule take 2 tabs at hs 12/18/16   [provider]    Family History Family History  Problem Relation Age of Onset  . Healthy Mother   . Hypertension Father   . Cancer Maternal Grandmother        cancer, breast  . Cancer Paternal Grandmother        breast  . Healthy Sister   . Hypertension Paternal Grandfather   . Healthy Daughter   . Healthy Son     Social History Social History   Tobacco Use  . Smoking status: Never Smoker  . Smokeless tobacco: Never Used  Vaping Use  . Vaping Use: Never used  Substance Use Topics  . Alcohol use: No    Alcohol/week: 0.0 standard drinks  . Drug use: Never     Allergies   Patient has no known allergies.   Review of Systems Review of  Systems  Constitutional: Negative for chills and fever.  HENT: Positive for ear pain. Negative for sore throat.   Eyes: Negative for pain and visual disturbance.  Respiratory: Negative for cough and shortness of breath.   Cardiovascular: Negative for chest pain and palpitations.  Gastrointestinal: Negative for abdominal pain and vomiting.  Genitourinary: Negative for dysuria and hematuria.  Musculoskeletal: Negative for arthralgias and back pain.  Skin: Negative for color change and rash.  Neurological: Negative for  seizures and syncope.  All other systems reviewed and are negative.    Physical Exam Triage Vital Signs ED Triage Vitals  Enc Vitals Group     BP      Pulse      Resp      Temp      Temp src      SpO2      Weight      Height      Head Circumference      Peak Flow      Pain Score      Pain Loc      Pain Edu?      Excl. in GC?    No data found.  Updated Vital Signs BP 126/85 (BP Location: Right Arm)   Pulse (!) 107   Temp 98.2 F (36.8 C) (Oral)   Resp 16   Wt 140 lb (63.5 kg)   LMP  (Within Months) Comment: 1 month ago   SpO2 98%   BMI 24.80 kg/m   Visual Acuity Right Eye Distance:   Left Eye Distance:   Bilateral Distance:    Right Eye Near:   Left Eye Near:    Bilateral Near:     Physical Exam Vitals and nursing note reviewed.  Constitutional:      General: She is not in acute distress.    Appearance: She is well-developed. She is not ill-appearing.  HENT:     Head: Normocephalic and atraumatic.     Right Ear: Tympanic membrane and ear canal normal.     Left Ear: Tympanic membrane and ear canal normal.     Nose: Nose normal.     Mouth/Throat:     Mouth: Mucous membranes are moist.     Pharynx: Oropharynx is clear.  Eyes:     Conjunctiva/sclera: Conjunctivae normal.  Cardiovascular:     Rate and Rhythm: Normal rate and regular rhythm.     Heart sounds: Normal heart sounds.  Pulmonary:     Effort: Pulmonary effort is normal. No  respiratory distress.     Breath sounds: Normal breath sounds.  Abdominal:     Palpations: Abdomen is soft.     Tenderness: There is no abdominal tenderness.  Musculoskeletal:     Cervical back: Neck supple.  Skin:    General: Skin is warm and dry.  Neurological:     General: No focal deficit present.     Mental Status: She is alert and oriented to person, place, and time.  Psychiatric:        Mood and Affect: Mood normal.        Behavior: Behavior normal.      UC Treatments / Results  Labs (all labs ordered are listed, but only abnormal results are displayed) Labs Reviewed - No data to display  EKG   Radiology No results found.  Procedures Procedures (including critical care time)  Medications Ordered in UC Medications - No data to display  Initial Impression / Assessment and Plan / UC Course  I have reviewed the triage vital signs and the nursing notes.  Pertinent labs & imaging results that were available during my care of the patient were reviewed by me and considered in my medical decision making (see chart for details).   Left otalgia and eustachian tube dysfunction.  No indication of infection at this time.  Instructed patient to continue taking ibuprofen and start taking plain over-the-counter Mucinex.  Instructed her to follow-up with her PCP or  an ENT if her symptoms are not improving.  She agrees to plan of care.   Final Clinical Impressions(s) / UC Diagnoses   Final diagnoses:  Acute otalgia, left  Acute dysfunction of left eustachian tube     Discharge Instructions     Take ibuprofen and plain over-the-counter Mucinex as directed.    Follow up with your primary care provider or an Ear, Nose, & Throat specialist if your symptoms are not improving.       ED Prescriptions    None     PDMP not reviewed this encounter.   Mickie Bail, NP 11/21/20 1452

## 2020-11-21 NOTE — ED Triage Notes (Signed)
Patient presents to Urgent Care with complaints of left ear pain since yesterday. Treating pain with ibuprofen with no relief.   Denies fever.

## 2020-11-30 ENCOUNTER — Other Ambulatory Visit: Payer: Self-pay

## 2020-11-30 ENCOUNTER — Encounter: Payer: Self-pay | Admitting: Gastroenterology

## 2020-11-30 ENCOUNTER — Ambulatory Visit (INDEPENDENT_AMBULATORY_CARE_PROVIDER_SITE_OTHER): Payer: BC Managed Care – PPO | Admitting: Gastroenterology

## 2020-11-30 VITALS — BP 135/94 | HR 106 | Temp 98.4°F | Ht 63.0 in | Wt 140.0 lb

## 2020-11-30 DIAGNOSIS — K5900 Constipation, unspecified: Secondary | ICD-10-CM

## 2020-11-30 MED ORDER — LUBIPROSTONE 24 MCG PO CAPS: 24.0000 ug | ORAL_CAPSULE | Freq: Two times a day (BID) | ORAL | 0 refills | Status: AC

## 2020-12-01 NOTE — Progress Notes (Signed)
Alejandra Bouillon, MD [address]  [address]  [place], [place] [address]  Main: [phone]  Fax: [phone]   Primary Care Physician: Allegra Grana, FNP   Chief Complaint  Patient presents with  . Constipation    HPI: Alejandra Cook is a 40 y.o. female here for follow-up of constipation.  Patient was placed on Linzess, which was increased further to 290 MCG a day.  Patient states she has not seen good results with this medication and so stopped taking it a month ago.  Trulance was prescribed but was not covered by her insurance as well, so she was unable to get it.  Patient reports having a bowel movement only after a week, only after she takes over-the-counter laxatives.  Please see previous progress note for previous history of colonoscopy in 2007.  Patient denies any blood in stool.  Abdominal pain, nausea or vomiting.  No weight loss.  No family history of colon cancer.  Current Outpatient Medications  Medication Sig Dispense Refill  . AIMOVIG 140 MG/ML SOAJ SMARTSIG:140 Milligram(s) SUB-Q Every 4 Weeks    . ASPERCREME LIDOCAINE 4 % PTCH SMARTSIG:1 Patch(s) Topical Every 12 Hours PRN    . buPROPion (WELLBUTRIN XL) 150 MG 24 hr tablet START WITH TAKING 1 TABLET BY MOUTH EACH AM, THEN INCREASE AFTER 3 DAYS TO 2 TABLETS (300MG ) EACH AM 180 tablet 2  . buPROPion (WELLBUTRIN XL) 300 MG 24 hr tablet Take 1 tablet (300 mg total) by mouth every morning. 90 tablet 3  . butalbital-acetaminophen-caffeine (FIORICET) 50-325-40 MG tablet Take 1 tablet by mouth as needed.    . cyclobenzaprine (FLEXERIL) 5 MG tablet Take 1 tablet (5 mg total) by mouth 3 (three) times daily as needed for muscle spasms. 30 tablet 1  . Fremanezumab-vfrm 225 MG/1.5ML SOAJ Inject 1.5 mLs into the skin every 28 (twenty-eight) days.    . LO LOESTRIN FE 1 MG-10 MCG / 10 MCG tablet Take 1 tablet by mouth daily. as directed  9  . lubiprostone (AMITIZA) 24 MCG capsule Take 1 capsule (24 mcg total) by  mouth 2 (two) times daily with a meal. 180 capsule 0  . meloxicam (MOBIC) 7.5 MG tablet Take 7.5 mg by mouth daily.    . ondansetron (ZOFRAN) 4 MG tablet TAKE 1 TABLET (4 MG TOTAL) BY MOUTH DAILY AS NEEDED FOR NAUSEA OR VOMITING. 18 tablet 1  . promethazine (PHENERGAN) 12.5 MG tablet Take 1-2 tablets (12.5-25 mg total) by mouth once as needed for up to 1 dose for nausea or vomiting. With migraines. 20 tablet 2  . rizatriptan (MAXALT) 10 MG tablet Take 1 tablet (10 mg total) by mouth as needed for migraine. May repeat in 2 hours if needed. Max dose 3 pills in 24 hours 10 tablet 1  . sertraline (ZOLOFT) 100 MG tablet Take 100 mg by mouth daily.  2  . UBRELVY 100 MG TABS Take 1 tablet by mouth daily.    09-[birth year]-1984 zonisamide (ZONEGRAN) 100 MG capsule take 2 tabs at hs  2  . gabapentin (NEURONTIN) 300 MG capsule Take 300 mg by mouth at bedtime.      No current facility-administered medications for this visit.    Allergies as of 11/30/2020  . (No Known Allergies)    ROS:  General: Negative for anorexia, weight loss, fever, chills, fatigue, weakness. ENT: Negative for hoarseness, difficulty swallowing , nasal congestion. CV: Negative for chest pain, angina, palpitations, dyspnea on exertion, peripheral edema.  Respiratory: Negative  for dyspnea at rest, dyspnea on exertion, cough, sputum, wheezing.  GI: See history of present illness. GU:  Negative for dysuria, hematuria, urinary incontinence, urinary frequency, nocturnal urination.  Endo: Negative for unusual weight change.    Physical Examination:   BP (!) 135/94   Pulse (!) 106   Temp 98.4 F (36.9 C) (Oral)   Ht 5\' 3"  (1.6 m)   Wt 140 lb (63.5 kg)   BMI 24.80 kg/m   General: Well-nourished, well-developed in no acute distress.  Eyes: No icterus. Conjunctivae pink. Mouth: Oropharyngeal mucosa moist and pink , no lesions erythema or exudate. Neck: Supple, Trachea midline Abdomen: Bowel sounds are normal, nontender, nondistended, no  hepatosplenomegaly or masses, no abdominal bruits or hernia , no rebound or guarding.   Extremities: No lower extremity edema. No clubbing or deformities. Neuro: Alert and oriented x 3.  Grossly intact. Skin: Warm and dry, no jaundice.   Psych: Alert and cooperative, normal mood and affect.   Labs: CMP     Component Value Date/Time   NA 141 02/25/2020 1728   K 3.8 02/25/2020 1728   CL 108 02/25/2020 1728   CO2 24 02/25/2020 1728   GLUCOSE 101 (H) 02/25/2020 1728   BUN 15 02/25/2020 1728   CREATININE 1.03 (H) 02/25/2020 1728   CALCIUM 9.1 02/25/2020 1728   PROT 7.2 02/25/2020 1728   ALBUMIN 4.8 02/25/2020 1728   AST 17 02/25/2020 1728   ALT 12 02/25/2020 1728   ALKPHOS 62 02/25/2020 1728   BILITOT 0.7 02/25/2020 1728   GFRNONAA >60 02/25/2020 1728   GFRAA >60 02/25/2020 1728   Lab Results  Component Value Date   WBC 7.7 02/25/2020   HGB 15.1 (H) 02/25/2020   HCT 43.5 02/25/2020   MCV 94.4 02/25/2020   PLT 231 02/25/2020    Imaging Studies: No results found.  Assessment and Plan:   Alejandra Cook is a 40 y.o. y/o female here for follow-up of constipation  Patient has failed Linzess Trulance was not covered well by her insurance and so she was unable to get it She is still going a week without a bowel movement  No alarm symptoms present  Amitiza appears to be under formulary for insurance.  We will prescribe.  If she has issues with coverage, she was advised to call 24 back and we may need to do prior authorization for Amitiza, or Trulance.    Dr [place]

## 2021-02-19 ENCOUNTER — Other Ambulatory Visit: Payer: Self-pay | Admitting: Gastroenterology

## 2021-02-27 ENCOUNTER — Encounter: Payer: Self-pay | Admitting: Family

## 2021-02-27 ENCOUNTER — Telehealth: Payer: BC Managed Care – PPO | Admitting: Family

## 2021-02-27 VITALS — Ht 63.0 in | Wt 140.0 lb

## 2021-02-27 DIAGNOSIS — B029 Zoster without complications: Secondary | ICD-10-CM | POA: Diagnosis not present

## 2021-02-27 MED ORDER — VALACYCLOVIR HCL 1 G PO TABS
1000.0000 mg | ORAL_TABLET | Freq: Three times a day (TID) | ORAL | 0 refills | Status: DC
Start: 2021-02-27 — End: 2021-03-08

## 2021-02-27 NOTE — Assessment & Plan Note (Addendum)
Symptoms consistent with herpes zoster.  She has a history of varicella as a child.  Rash does not cross midline.  We will go ahead and start Valtrex.  She is already prescribed gabapentin 300mg  qhs which she will continue.  Advised her to use topical lidocaine over-the-counter for pain control.  Counseled extensively on shingles transmission particularly as it relates to immunocompromised individuals until lesions are fully crusted over.  She verbalized understanding.

## 2021-02-27 NOTE — Patient Instructions (Signed)
Please review shingles transmission as we discussed per CDC.  Link included below. Please start Valtrex. Over-the-counter lidocaine patch, gel was appropriate for pain.  Please let me know how you are doing.  [email or web address]  Shingles  Shingles, which is also known as herpes zoster, is an infection that causes apainful skin rash and fluid-filled blisters. It is caused by a virus. Shingles only develops in people who: Have had chickenpox. Have been vaccinated against chickenpox. Shingles is rare in this group. What are the causes? Shingles is caused by varicella-zoster virus. This is the same virus that causes chickenpox. After a person is exposed to the virus, it stays in the body in an inactive (dormant) state. Shingles develops if the virus is reactivated. This can happen many years after the first (initial) exposure to the virus. It is not known what causes this virus to bereactivated. What increases the risk? People who have had chickenpox or received the chickenpox vaccine are at risk for shingles. Shingles infection is more common in people who: Are older than 40 years of age. Have a weakened disease-fighting system (immune system), such as people with: HIV (human immunodeficiency virus). AIDS (acquired immunodeficiency syndrome). Cancer. Are taking medicines that weaken the immune system, such as organ transplant medicines. Are experiencing a lot of stress. What are the signs or symptoms? Early symptoms of this condition include itching, tingling, and pain in an areaon your skin. Pain may be described as burning, stabbing, or throbbing. A few days or weeks after early symptoms start, a painful red rash appears. The rash is usually on one side of the body and has a band-like or belt-like pattern. The rash eventually turns into fluid-filled blisters that break open,change into scabs, and dry up in about 2-3 weeks. At any time during the infection,  you may also develop: A fever. Chills. A headache. Nausea. How is this diagnosed? This condition is diagnosed with a skin exam. Skin or fluid samples (a culture) may be taken from the blisters before a diagnosis is made. How is this treated? The rash may last for several weeks. There is not a specific cure for this condition. Your health care provider may prescribe medicines to help you manage pain, recover more quickly, and avoid long-term problems. Medicines may include: Antiviral medicines. Anti-inflammatory medicines. Pain medicines. Anti-itching medicines (antihistamines). If the area involved is on your face, you may be referred to a specialist, such as an eye doctor (ophthalmologist) or an ear, nose, and throat (ENT) doctor (otorhinolaryngologist) to help you avoid eye problems, chronic pain, or disability. Follow these instructions at home: Medicines Take over-the-counter and prescription medicines only as told by your health care provider. Apply an anti-itch cream or numbing cream to the affected area as told by your health care provider. Relieving itching and discomfort  Apply cold, wet cloths (cold compresses) to the area of the rash or blisters as told by your health care provider. Cool baths can be soothing. Try adding baking soda or dry oatmeal to the water to reduce itching. Do not bathe in hot water. Use calamine lotion as recommended by your health care provider. This is an over-the-counter lotion that helps to relieve itchiness.  Blister and rash care Keep your rash covered with a loose bandage (dressing). Wear loose-fitting clothing to help ease the pain of material rubbing against the rash. Wash your hands with soap and water for at least 20 seconds before and after you change your dressing. If soap and water are not available,  use hand sanitizer. Change your dressing as told by your health care provider. Keep your rash and blisters clean by washing the area with mild  soap and cool water as told by your health care provider. Check your rash every day for signs of infection. Check for: More redness, swelling, or pain. Fluid or blood. Warmth. Pus or a bad smell. Do not scratch your rash or pick at your blisters. To help avoid scratching: Keep your fingernails clean and cut short. Wear gloves or mittens while you sleep, if scratching is a problem. General instructions Rest as told by your health care provider. Wash your hands often with soap and water for at least 20 seconds. If soap and water are not available, use hand sanitizer. Doing this lowers your chance of getting a bacterial skin infection. Before your blisters change into scabs, your shingles infection can cause chickenpox in people who have never had it or have never been vaccinated against it. To prevent this from happening, avoid contact with other people, especially: Babies. Pregnant women. Children who have eczema. Older people who have transplants. People who have chronic illnesses, such as cancer or AIDS. Keep all follow-up visits. This is important. How is this prevented? Getting vaccinated is the best way to prevent shingles and protect against shingles complications. If you have not been vaccinated, talk with your healthcare provider about getting the vaccine. Where to find more information Centers for Disease Control and Prevention: FootballExhibition.com.br Contact a health care provider if: Your pain is not relieved with prescribed medicines. Your pain does not get better after the rash heals. You have any of these signs of infection: More redness, swelling, or pain around the rash. Fluid or blood coming from the rash. Warmth coming from your rash. Pus or a bad smell coming from the rash. A fever. Get help right away if: The rash is on your face or nose. You have facial pain, pain around your eye area, or loss of feeling on one side of your face. You have difficulty seeing. You have ear  pain or have ringing in your ear. You have a loss of taste. Your condition gets worse. Summary Shingles, also known as herpes zoster, is an infection that causes a painful skin rash and fluid-filled blisters. This condition is diagnosed with a skin exam. Skin or fluid samples (a culture) may be taken from the blisters. Keep your rash covered with a loose bandage (dressing). Wear loose-fitting clothing to help ease the pain of material rubbing against the rash. Before your blisters change into scabs, your shingles infection can cause chickenpox in people who have never had it or have never been vaccinated against it. This information is not intended to replace advice given to you by your health care provider. Make sure you discuss any questions you have with your healthcare provider. Document Revised: 07/18/2020 Document Reviewed: 07/18/2020 Elsevier Patient Education  2022 [employer].

## 2021-02-27 NOTE — Progress Notes (Signed)
Virtual Visit via Video Note  I connected with@  on 02/27/21 at  2:00 PM EDT by a video enabled telemedicine application and verified that I am speaking with the correct person using two identifiers.  Location patient: home Location provider:work  Persons participating in the virtual visit: patient, provider  I discussed the limitations of evaluation and management by telemedicine and the availability of in person appointments. The patient expressed understanding and agreed to proceed.   HPI: Acute visit for rash on left posterior shoulder and armpit,  x 1 day, unchanged . 'hurts to the touch'. Describes as red patches. Not draining. She presented [facility] walk-in clinic yesterday for left shoulder pain.  She was given Toradol injection and prednisone, baclofen.  She did not start the prednisone.  This morning she noticed a rash on her left shoulder and under her left armpit.  Started to have pain in left shoulder x 4 days ago.   No injury or heavy lifting.  No fever, cp, bruising, swelling of arm, numbness of arm  Tried icy hot, flexeril without relief.   ROS: See pertinent positives and negatives per HPI.    EXAM:  VITALS per patient if applicable: Ht 5\' 3"  (1.6 m)   Wt 140 lb (63.5 kg)   BMI 24.80 kg/m  BP Readings from Last 3 Encounters:  11/30/20 (!) 135/94  11/21/20 126/85  08/05/20 117/81   Wt Readings from Last 3 Encounters:  02/27/21 140 lb (63.5 kg)  11/30/20 140 lb (63.5 kg)  11/21/20 140 lb (63.5 kg)    GENERAL: alert, oriented, appears well and in no acute distress  HEENT: atraumatic, conjunttiva clear, no obvious abnormalities on inspection of external nose and ears  NECK: normal movements of the head and neck  LUNGS: on inspection no signs of respiratory distress, breathing rate appears normal, no obvious gross SOB, gasping or wheezing  CV: no obvious cyanosis  MS: moves all visible extremities without noticeable abnormality  PSYCH/NEURO:  pleasant and cooperative, no obvious depression or anxiety, speech and thought processing grossly intact  Skin: Hyper erythematous confluent patches seen left scapular area.  No drainage appreciated over video.  Rash does not cross midline.  ASSESSMENT AND PLAN:  Discussed the following assessment and plan:  Problem List Items Addressed This Visit       Other   Shingles - Primary    Symptoms consistent with herpes zoster.  She has a history of varicella as a child.  Rash does not cross midline.  We will go ahead and start Valtrex.  She is already prescribed gabapentin 300mg  qhs which she will continue.  Advised her to use topical lidocaine over-the-counter for pain control.  Counseled extensively on shingles transmission particularly as it relates to immunocompromised individuals until lesions are fully crusted over.  She verbalized understanding.       Relevant Medications   valACYclovir (VALTREX) 1000 MG tablet    -we discussed possible serious and likely etiologies, options for evaluation and workup, limitations of telemedicine visit vs in person visit, treatment, treatment risks and precautions. Pt prefers to treat via telemedicine empirically rather then risking or undertaking an in person visit at this moment.  .   I discussed the assessment and treatment plan with the patient. The patient was provided an opportunity to ask questions and all were answered. The patient agreed with the plan and demonstrated an understanding of the instructions.   The patient was advised to call back or seek an in-person evaluation  if the symptoms worsen or if the condition fails to improve as anticipated.   Mable Paris, FNP

## 2021-03-01 ENCOUNTER — Ambulatory Visit: Payer: BC Managed Care – PPO | Admitting: Gastroenterology

## 2021-03-07 ENCOUNTER — Encounter: Payer: Self-pay | Admitting: Family

## 2021-03-08 ENCOUNTER — Encounter: Payer: Self-pay | Admitting: Gastroenterology

## 2021-03-08 ENCOUNTER — Ambulatory Visit: Payer: BC Managed Care – PPO | Admitting: Gastroenterology

## 2021-03-08 ENCOUNTER — Other Ambulatory Visit: Payer: Self-pay | Admitting: Family

## 2021-03-08 ENCOUNTER — Other Ambulatory Visit: Payer: Self-pay

## 2021-03-08 VITALS — BP 124/85 | HR 111 | Temp 98.4°F | Wt 147.6 lb

## 2021-03-08 DIAGNOSIS — K5900 Constipation, unspecified: Secondary | ICD-10-CM

## 2021-03-08 DIAGNOSIS — B029 Zoster without complications: Secondary | ICD-10-CM

## 2021-03-08 MED ORDER — TRULANCE 3 MG PO TABS: 1.0000 | ORAL_TABLET | Freq: Every day | ORAL | 0 refills | Status: DC

## 2021-03-08 MED ORDER — GABAPENTIN 100 MG PO CAPS: ORAL_CAPSULE | ORAL | 0 refills | Status: DC

## 2021-03-08 NOTE — Progress Notes (Signed)
Alejandra Bouillon, MD [address]  [address]  [place], [place] [address]  Main: [phone]  Fax: [phone]   Primary Care Physician: Allegra Grana, FNP   Chief Complaint  Patient presents with   Follow-up    Constipation... pt reports she is still having BM once a week, no real change in Sx    HPI: Alejandra Cook is a 40 y.o. female here for follow-up of constipation.  Patient taking Amitiza 24 MCG twice a day and despite this only having a bowel movement once a week.  Has failed full dose Linzess before as well.  Denies any nausea or vomiting.  No blood in stool.  No weight loss.  Good appetite.  No dysphagia.   ROS: All ROS reviewed and negative except as per HPI   Past Medical History:  Diagnosis Date   Allergy    Dr. Gwen Pounds, gold and nickel allergy   Anxiety    Bulging discs    Degenerative disc disease, lumbar    Depression    off of zoloft for 1 year, had problems with depression and anxiety after last child   Fibromyalgia    after pregnancy, Followed by [facility]   Fibromyalgia    Headache(784.0)    Dr. Neale Burly at [facility]; migraines since elementary    IBS (irritable bowel syndrome)    Intrahepatic cholestasis of pregnancy in third trimester, antepartum    Spontaneous abortion in second trimester Dec 2014    Past Surgical History:  Procedure Laterality Date   COLONOSCOPY     normal   COLPOSCOPY  2002   CYSTOSCOPY     ureteral stent inserted   DILATION AND EVACUATION N/A 08/07/2013   Procedure: DILATATION AND EVACUATION;  Surgeon: Zelphia Cairo, MD;  Location: [facility];  Service: Gynecology;  Laterality: N/A;   LAPAROSCOPY N/A 03/20/2013   Procedure: LAPAROSCOPY OPERATIVE  IUD REMOVAL;  Surgeon: Zelphia Cairo, MD;  Location: [facility];  Service: Gynecology;  Laterality: N/A;  Intraabdominal from [place]   NASAL SINUS SURGERY     PILONIDAL CYST EXCISION     VAGINAL DELIVERY  2011    Prior to Admission medications    Medication Sig Start Date End Date Taking? Authorizing Provider  AIMOVIG 140 MG/ML SOAJ SMARTSIG:140 Milligram(s) SUB-Q Every 4 Weeks 05/01/20  Yes [provider]  ASPERCREME LIDOCAINE 4 % PTCH SMARTSIG:1 Patch(s) Topical Every 12 Hours PRN 03/23/20  Yes [provider]  baclofen (LIORESAL) 10 MG tablet Take 10 mg by mouth 2 (two) times daily. 02/26/21  Yes [provider]  buPROPion (WELLBUTRIN XL) 300 MG 24 hr tablet Take 1 tablet (300 mg total) by mouth every morning. 05/09/20  Yes Allegra Grana, FNP  butalbital-acetaminophen-caffeine (FIORICET) [phone] MG tablet Take 1 tablet by mouth as needed. 08/04/19  Yes [provider]  cyclobenzaprine (FLEXERIL) 5 MG tablet Take 1 tablet (5 mg total) by mouth 3 (three) times daily as needed for muscle spasms. 02/04/19  Yes Guse, Janna Arch, FNP  Fremanezumab-vfrm 225 MG/1.5ML SOAJ Inject 1.5 mLs into the skin every 28 (twenty-eight) days. 10/05/20  Yes [provider]  gabapentin (NEURONTIN) 100 MG capsule Take one tablet in the morning and one tablet at noon for post herpetic pain. 03/08/21  Yes Arnett, Lyn Records, FNP  LO LOESTRIN FE 1 MG-10 MCG / 10 MCG tablet Take 1 tablet by mouth daily. as directed 12/19/16  Yes [provider]  methylPREDNISolone (MEDROL DOSEPAK) 4 MG TBPK  tablet Take by mouth. 02/26/21  Yes [provider]  ondansetron (ZOFRAN) 4 MG tablet TAKE 1 TABLET (4 MG TOTAL) BY MOUTH DAILY AS NEEDED FOR NAUSEA OR VOMITING. 02/15/18  Yes Arnett, Lyn Records, FNP  Plecanatide (TRULANCE) 3 MG TABS Take 1 tablet by mouth daily. 03/08/21  Yes Pasty Spillers, MD  promethazine (PHENERGAN) 12.5 MG tablet Take 1-2 tablets (12.5-25 mg total) by mouth once as needed for up to 1 dose for nausea or vomiting. With migraines. 10/30/17  Yes McLean-Scocuzza, Pasty Spillers, MD  rizatriptan (MAXALT) 10 MG tablet Take 1 tablet (10 mg total) by mouth as needed for migraine. May repeat in 2 hours if needed. Max  dose 3 pills in 24 hours 07/25/17  Yes McLean-Scocuzza, Pasty Spillers, MD  sertraline (ZOLOFT) 100 MG tablet Take 100 mg by mouth daily. 11/26/16  Yes [provider]  UBRELVY 100 MG TABS Take 1 tablet by mouth daily. 07/22/20  Yes [provider]  zonisamide (ZONEGRAN) 100 MG capsule take 2 tabs at hs 12/18/16  Yes [provider]  gabapentin (NEURONTIN) 300 MG capsule Take 300 mg by mouth at bedtime.  09/10/17 02/27/21  [provider]    Family History  Problem Relation Age of Onset   Healthy Mother    Hypertension Father    Cancer Maternal Grandmother        cancer, breast   Cancer Paternal Grandmother        breast   Healthy Sister    Hypertension Paternal Grandfather    Healthy Daughter    Healthy Son      Social History   Tobacco Use   Smoking status: Never   Smokeless tobacco: Never  Vaping Use   Vaping Use: Never used  Substance Use Topics   Alcohol use: No    Alcohol/week: 0.0 standard drinks   Drug use: Never    Allergies as of 03/08/2021   (No Known Allergies)    Physical Examination:  Constitutional: General:   Alert,  Well-developed, well-nourished, pleasant and cooperative in NAD BP 124/85   Pulse (!) 111   Temp 98.4 F (36.9 C) (Oral)   Wt 147 lb 9.6 oz (67 kg)   BMI 26.15 kg/m   Respiratory: Normal respiratory effort  Gastrointestinal:  Soft, non-tender and non-distended without masses, hepatosplenomegaly or hernias noted.  No guarding or rebound tenderness.     Cardiac: No clubbing or edema.  No cyanosis. Normal posterior tibial pedal pulses noted.  Psych:  Alert and cooperative. Normal mood and affect.  Musculoskeletal:  Normal gait. Head normocephalic, atraumatic. Symmetrical without gross deformities. 5/5 Lower extremity strength bilaterally.  Skin: Warm. Intact without significant lesions or rashes. No jaundice.  Neck: Supple, trachea midline  Lymph: No cervical lymphadenopathy  Psych:  Alert and oriented  x3, Alert and cooperative. Normal mood and affect.  Labs: CMP     Component Value Date/Time   NA 141 02/25/2020 1728   K 3.8 02/25/2020 1728   CL 108 02/25/2020 1728   CO2 24 02/25/2020 1728   GLUCOSE 101 (H) 02/25/2020 1728   BUN 15 02/25/2020 1728   CREATININE 1.03 (H) 02/25/2020 1728   CALCIUM 9.1 02/25/2020 1728   PROT 7.2 02/25/2020 1728   ALBUMIN 4.8 02/25/2020 1728   AST 17 02/25/2020 1728   ALT 12 02/25/2020 1728   ALKPHOS 62 02/25/2020 1728   BILITOT 0.7 02/25/2020 1728   GFRNONAA >60 02/25/2020 1728   GFRAA >60 02/25/2020 1728   Lab  Results  Component Value Date   WBC 7.7 02/25/2020   HGB 15.1 (H) 02/25/2020   HCT 43.5 02/25/2020   MCV 94.4 02/25/2020   PLT 231 02/25/2020    Imaging Studies:   Assessment and Plan:   Alejandra Cook is a 40 y.o. y/o female here for follow-up of constipation  At this point, patient has failed Linzess 290 MCG a day, and Amitiza 24 MCG twice a day given that she is not having a bowel movement for a week despite being on Amitiza  Both Trulance and Motegrity are nonformulary under her insurance as per epic.  We will prescribe Trulance and samples were given to the patient, and we will try to obtain prior authorization given that patient has failed 2 drugs at this time  Patient advised to call us in the next 2 weeks if she does not hear from her pharmacy about this medication  High-fiber diet recommended.  Continue Amitiza until we are able to get Trulance approved  Dr Alejandra Cook

## 2021-03-09 ENCOUNTER — Telehealth: Payer: Self-pay

## 2021-03-09 NOTE — Telephone Encounter (Signed)
PA has been submitted via [email or web address] and awaiting response 

## 2021-03-10 NOTE — Telephone Encounter (Signed)
Received fax from [facility] approving medication from 03/09/2021-03/09/2024

## 2021-03-27 ENCOUNTER — Encounter: Payer: Self-pay | Admitting: Family

## 2021-05-04 ENCOUNTER — Other Ambulatory Visit: Payer: Self-pay | Admitting: Family

## 2021-05-04 DIAGNOSIS — B029 Zoster without complications: Secondary | ICD-10-CM

## 2021-05-15 ENCOUNTER — Other Ambulatory Visit: Payer: Self-pay

## 2021-05-15 ENCOUNTER — Ambulatory Visit
Admission: RE | Admit: 2021-05-15 | Discharge: 2021-05-15 | Disposition: A | Discharge status: Home / Self Care | Payer: BC Managed Care – PPO | Source: Ambulatory Visit | Attending: Emergency Medicine | Admitting: Emergency Medicine

## 2021-05-15 VITALS — BP 115/76 | HR 108 | Temp 98.7°F | Resp 18

## 2021-05-15 DIAGNOSIS — H6691 Otitis media, unspecified, right ear: Secondary | ICD-10-CM

## 2021-05-15 LAB — POCT RAPID STREP A (OFFICE): Rapid Strep A Screen: NEGATIVE

## 2021-05-15 MED ORDER — CEFDINIR 300 MG PO CAPS: 300.0000 mg | ORAL_CAPSULE | Freq: Two times a day (BID) | ORAL | 0 refills | Status: AC

## 2021-05-15 MED ORDER — FLUCONAZOLE 150 MG PO TABS: 150.0000 mg | ORAL_TABLET | Freq: Every day | ORAL | 0 refills | Status: AC

## 2021-05-15 NOTE — ED Triage Notes (Signed)
Pt here with sore throat, right ear pain and nasal congestion x 1 week. Pt is teacher at high school.

## 2021-05-15 NOTE — Discharge Instructions (Signed)
Take cefdinir as prescribed.  Take your first Diflucan at initial onset of yeast infection symptoms (vaginal itching, thick white clumpy vaginal discharge) then your second Diflucan 3 to 5 days later if symptoms do not improve.  Follow-up with PCP after course of antibiotics have been completed for reassessment to ensure clearance of infection to right ear.  You may use Tylenol or ibuprofen as needed for fever or pain as long as neither of these medications are contraindicated to any current health conditions. Finish entire prescription of antibiotics.  Do not discontinue taking this medication when you begin to feel better.  Complete full course. Make sure that you eat with each dose of antibiotics to decrease stomach upset. Eat a daily [place] yogurt or take a daily probiotic to help with stomach upset from antibiotic use. Alternate warm and cool compresses to right ear to help with discomfort. Resting will help the body to fight infection.  Ensure that you receive adequate rest and aim for 8 hours of sleep nightly while recovering from illness.  If you begin to notice worsening of symptoms such as new high fever, worsening ear pain, redness or swelling behind your ear, new or worse discharge from your ear, difficulty hearing or if you are not beginning to feel better after 2 to 3 days return to clinic for further evaluation.  If you begin to notice dizziness, severe headache or confusion go to the ER for evaluation.

## 2021-05-15 NOTE — ED Provider Notes (Signed)
CHIEF COMPLAINT:   Chief Complaint  Patient presents with   Sore Throat   Nasal Congestion   Otalgia     SUBJECTIVE/HPI:   Sore Throat  Otalgia A very pleasant 40 y.o.Female presents today with sore throat, right ear pain and nasal congestion for the last week. Patient does not report any shortness of breath, chest pain, palpitations, visual changes, weakness, tingling, headache, nausea, vomiting, diarrhea, fever, chills.   has a past medical history of Allergy, Anxiety, Bulging discs, Degenerative disc disease, lumbar, Depression, Fibromyalgia, Fibromyalgia, Headache(784.0), IBS (irritable bowel syndrome), Intrahepatic cholestasis of pregnancy in third trimester, antepartum, and Spontaneous abortion in second trimester (Dec 2014).  ROS:  Review of Systems  HENT:  Positive for ear pain.   See Subjective/HPI Medications, Allergies and Problem List personally reviewed in Epic today OBJECTIVE:   Vitals:   05/15/21 1734  BP: 115/76  Pulse: (!) 108  Resp: 18  Temp: 98.7 F (37.1 C)  SpO2: 100%    Physical Exam   General: Appears well-developed and well-nourished. No acute distress.  HEENT Head: Normocephalic and atraumatic.   Ears: Hearing grossly intact, no drainage or visible deformity.  Right TM erythematous and opaque with mild bulging.  Left TM mildly erythematous with effusion present beneath TM. Nose: No nasal deviation.   Mouth/Throat: No stridor or tracheal deviation.  Mildly erythematous posterior pharynx noted with clear drainage present.  No white patchy exudate noted. Eyes: Conjunctivae and EOM are normal. No eye drainage or scleral icterus bilaterally.  Neck: Normal range of motion, neck is supple.  Cardiovascular: Normal rate. Regular rhythm; no murmurs, gallops, or rubs.  Pulm/Chest: No respiratory distress. Breath sounds normal bilaterally without wheezes, rhonchi, or rales.  Neurological: Alert and oriented to person, place, and time.  Skin: Skin is warm  and dry.  No rashes, lesions, abrasions or bruising noted to skin.   Psychiatric: Normal mood, affect, behavior, and thought content.   Vital signs and nursing note reviewed.   Patient stable and cooperative with examination. PROCEDURES:    LABS/X-RAYS/EKG/MEDS:   Results for orders placed or performed during the hospital encounter of 05/15/21  POCT rapid strep A  Result Value Ref Range   Rapid Strep A Screen Negative Negative    MEDICAL DECISION MAKING:   Patient presents with sore throat, right ear pain and nasal congestion for the last week. Patient does not report any shortness of breath, chest pain, palpitations, visual changes, weakness, tingling, headache, nausea, vomiting, diarrhea, fever, chills.  Strep negative.  Given symptoms along with assessment findings, likely right acute otitis media.  Rx'd cefdinir to the patient's preferred pharmacy and advised about home treatment and care to include warm or cool compresses, Tylenol versus ibuprofen and following up with PCP after antibiotics been completed to have the ear rechecked for clearance of infection.  Patient states that sometimes she will have a yeast infection after antibiotic use, sent a Diflucan to her preferred pharmacy and advised to not start this medication unless she begins to have symptoms of a yeast infection.  Return with any worsening of symptoms to include new or worse redness or swelling behind her ear, fever, chills, discharge from the ear.  ED for dizziness, severe headache or confusion.  Patient verbalized understanding and agreed with treatment plan.  Patient stable upon discharge. ASSESSMENT/PLAN:  1. Acute right otitis media - cefdinir (OMNICEF) 300 MG capsule; Take 1 capsule (300 mg total) by mouth 2 (two) times daily for 7 days.  Dispense: 14  capsule; Refill: 0 Instructions about new medications and side effects provided.  Meds ordered this encounter  Medications   cefdinir (OMNICEF) 300 MG capsule     Sig: Take 1 capsule (300 mg total) by mouth 2 (two) times daily for 7 days.    Dispense:  14 capsule    Refill:  0    Order Specific Question:   Supervising Provider    Answer:   Merrilee Jansky [[id number]]   fluconazole (DIFLUCAN) 150 MG tablet    Sig: Take 1 tablet (150 mg total) by mouth daily for 1 day.    Dispense:  2 tablet    Refill:  0    Order Specific Question:   Supervising Provider    Answer:   Merrilee Jansky [[id number]]     Plan:   Discharge Instructions      Take cefdinir as prescribed.  Take your first Diflucan at initial onset of yeast infection symptoms (vaginal itching, thick white clumpy vaginal discharge) then your second Diflucan 3 to 5 days later if symptoms do not improve.  Follow-up with PCP after course of antibiotics have been completed for reassessment to ensure clearance of infection to right ear.  You may use Tylenol or ibuprofen as needed for fever or pain as long as neither of these medications are contraindicated to any current health conditions. Finish entire prescription of antibiotics.  Do not discontinue taking this medication when you begin to feel better.  Complete full course. Make sure that you eat with each dose of antibiotics to decrease stomach upset. Eat a daily [place] yogurt or take a daily probiotic to help with stomach upset from antibiotic use. Alternate warm and cool compresses to right ear to help with discomfort. Resting will help the body to fight infection.  Ensure that you receive adequate rest and aim for 8 hours of sleep nightly while recovering from illness.  If you begin to notice worsening of symptoms such as new high fever, worsening ear pain, redness or swelling behind your ear, new or worse discharge from your ear, difficulty hearing or if you are not beginning to feel better after 2 to 3 days return to clinic for further evaluation.  If you begin to notice dizziness, severe headache or confusion go to the ER for  evaluation.           Amalia Greenhouse, FNP 05/15/21 1757

## 2021-05-29 ENCOUNTER — Ambulatory Visit
Admission: EM | Admit: 2021-05-29 | Discharge: 2021-05-29 | Disposition: A | Discharge status: Home / Self Care | Payer: BC Managed Care – PPO | Attending: Internal Medicine | Admitting: Internal Medicine

## 2021-05-29 ENCOUNTER — Ambulatory Visit: Payer: Self-pay

## 2021-05-29 ENCOUNTER — Encounter: Payer: Self-pay | Admitting: Emergency Medicine

## 2021-05-29 ENCOUNTER — Other Ambulatory Visit: Payer: Self-pay

## 2021-05-29 DIAGNOSIS — J3489 Other specified disorders of nose and nasal sinuses: Secondary | ICD-10-CM

## 2021-05-29 MED ORDER — GUAIFENESIN ER 600 MG PO TB12: 600.0000 mg | ORAL_TABLET | Freq: Two times a day (BID) | ORAL | 0 refills | Status: AC

## 2021-05-29 MED ORDER — FLUTICASONE PROPIONATE 50 MCG/ACT NA SUSP: 1.0000 | Freq: Every day | NASAL | 0 refills | Status: DC

## 2021-05-29 NOTE — Discharge Instructions (Addendum)
Continue saline nasal rinse Use medications as prescribed If symptoms worsen please return to urgent care to be reevaluated.

## 2021-05-29 NOTE — ED Provider Notes (Signed)
[facility]    CSN: [id number] Arrival date & time: 05/29/21  1445      History   Chief Complaint Chief Complaint  Patient presents with   Sinus Issues     HPI Alejandra Cook is a 40 y.o. female comes to the urgent care with few days history of nasal congestion, sinus pressure and pain as well as chest congestion and sore throat.  Patient denies any cough.  No fever or chills.  No shortness of breath or wheezing.  Patient was recently treated for an ear infection.  She took [place] for 7 days.  She denies any seasonal allergies.  Patient had nasal septum/sinus surgery several years ago.  No generalized body aches.  No vomiting or diarrhea.Marland Kitchen   HPI  Past Medical History:  Diagnosis Date   Allergy    Dr. Gwen Pounds, gold and nickel allergy   Anxiety    Bulging discs    Degenerative disc disease, lumbar    Depression    off of zoloft for 1 year, had problems with depression and anxiety after last child   Fibromyalgia    after pregnancy, Followed by [facility]   Fibromyalgia    Headache(784.0)    Dr. Neale Burly at [facility]; migraines since elementary    IBS (irritable bowel syndrome)    Intrahepatic cholestasis of pregnancy in third trimester, antepartum    Spontaneous abortion in second trimester Dec 2014    Patient Active Problem List   Diagnosis Date Noted   Shingles 02/27/2021   Sinus tachycardia 07/21/2020   Heart palpitations 06/26/2020   Otitis media 06/02/2020   Low back pain 02/23/2020   Amenorrhea 01/25/2020   Depression, major, single episode, mild (HCC) 11/23/2019   Irritable bowel syndrome 09/10/2019   Intractable migraine with aura without status migrainosus 11/07/2018   Contusion of left hand 09/09/2018   Polycythemia 12/01/2017   History of migraine 12/20/2016   Other fatigue 12/20/2016   Primary insomnia 12/20/2016   History of IBS 12/20/2016   History of renal calculi 12/20/2016   History of sinusitis 12/20/2016   Bug bites  01/23/2016   Bronchitis 08/11/2014   Routine general medical examination at a health care facility 01/21/2013   Fibromyalgia 01/17/2012   Allergic rhinitis 01/17/2012   Migraine headache 05/12/2007   DDD (degenerative disc disease), lumbosacral 05/12/2007    Past Surgical History:  Procedure Laterality Date   COLONOSCOPY     normal   COLPOSCOPY  2002   CYSTOSCOPY     ureteral stent inserted   DILATION AND EVACUATION N/A 08/07/2013   Procedure: DILATATION AND EVACUATION;  Surgeon: Zelphia Cairo, MD;  Location: [facility];  Service: Gynecology;  Laterality: N/A;   LAPAROSCOPY N/A 03/20/2013   Procedure: LAPAROSCOPY OPERATIVE  IUD REMOVAL;  Surgeon: Zelphia Cairo, MD;  Location: [facility];  Service: Gynecology;  Laterality: N/A;  Intraabdominal from [place]   NASAL SINUS SURGERY     PILONIDAL CYST EXCISION     VAGINAL DELIVERY  2011    OB History     Gravida  4   Para  2   Term  1   Preterm  1   AB  2   Living  2      SAB  2   IAB      Ectopic      Multiple  0   Live Births  2            Home Medications  Prior to Admission medications   Medication Sig Start Date End Date Taking? Authorizing Provider  AIMOVIG 140 MG/ML SOAJ SMARTSIG:140 Milligram(s) SUB-Q Every 4 Weeks 05/01/20  Yes [provider]  ASPERCREME LIDOCAINE 4 % PTCH SMARTSIG:1 Patch(s) Topical Every 12 Hours PRN 03/23/20  Yes [provider]  buPROPion (WELLBUTRIN XL) 300 MG 24 hr tablet Take 1 tablet (300 mg total) by mouth every morning. 05/09/20  Yes Arnett, Lyn Records, FNP  fluticasone (FLONASE) 50 MCG/ACT nasal spray Place 1 spray into both nostrils daily. 05/29/21  Yes Oaklan Persons, Britta Mccreedy, MD  Fremanezumab-vfrm 225 MG/1.5ML SOAJ Inject 1.5 mLs into the skin every 28 (twenty-eight) days. 10/05/20  Yes [provider]  guaiFENesin (MUCINEX) 600 MG 12 hr tablet Take 1 tablet (600 mg total) by mouth 2 (two) times daily for 15 days. 05/29/21 06/13/21 Yes Elice Crigger, Britta Mccreedy, MD   LO LOESTRIN FE 1 MG-10 MCG / 10 MCG tablet Take 1 tablet by mouth daily. as directed 12/19/16  Yes [provider]  Plecanatide (TRULANCE) 3 MG TABS Take 1 tablet by mouth daily. 03/08/21  Yes Pasty Spillers, MD  rizatriptan (MAXALT) 10 MG tablet Take 1 tablet (10 mg total) by mouth as needed for migraine. May repeat in 2 hours if needed. Max dose 3 pills in 24 hours 07/25/17  Yes McLean-Scocuzza, Pasty Spillers, MD  sertraline (ZOLOFT) 100 MG tablet Take 100 mg by mouth daily. 11/26/16  Yes [provider]  UBRELVY 100 MG TABS Take 1 tablet by mouth daily. 07/22/20  Yes [provider]  zonisamide (ZONEGRAN) 100 MG capsule take 2 tabs at hs 12/18/16  Yes [provider]  baclofen (LIORESAL) 10 MG tablet Take 10 mg by mouth 2 (two) times daily. 02/26/21   [provider]  butalbital-acetaminophen-caffeine (FIORICET) 50-325-40 MG tablet Take 1 tablet by mouth as needed. 08/04/19   [provider]  cyclobenzaprine (FLEXERIL) 5 MG tablet Take 1 tablet (5 mg total) by mouth 3 (three) times daily as needed for muscle spasms. 02/04/19   Guse, Janna Arch, FNP  gabapentin (NEURONTIN) 100 MG capsule TAKE ONE TABLET IN THE MORNING AND ONE TABLET AT NOON FOR POST HERPETIC PAIN. 05/04/21   Allegra Grana, FNP  gabapentin (NEURONTIN) 300 MG capsule Take 300 mg by mouth at bedtime.  09/10/17 02/27/21  [provider]  methylPREDNISolone (MEDROL DOSEPAK) 4 MG TBPK tablet Take by mouth. 02/26/21   [provider]  ondansetron (ZOFRAN) 4 MG tablet TAKE 1 TABLET (4 MG TOTAL) BY MOUTH DAILY AS NEEDED FOR NAUSEA OR VOMITING. 02/15/18   Allegra Grana, FNP  promethazine (PHENERGAN) 12.5 MG tablet Take 1-2 tablets (12.5-25 mg total) by mouth once as needed for up to 1 dose for nausea or vomiting. With migraines. 10/30/17   McLean-Scocuzza, Pasty Spillers, MD    Family History Family History  Problem Relation Age of Onset   Healthy Mother    Hypertension Father     Cancer Maternal Grandmother        cancer, breast   Cancer Paternal Grandmother        breast   Healthy Sister    Hypertension Paternal Grandfather    Healthy Daughter    Healthy Son     Social History Social History   Tobacco Use   Smoking status: Never   Smokeless tobacco: Never  Vaping Use   Vaping Use: Never used  Substance Use Topics   Alcohol use: No    Alcohol/week: 0.0 standard  drinks   Drug use: Never     Allergies   Patient has no known allergies.   Review of Systems Review of Systems  Constitutional: Negative.   HENT:  Positive for congestion, sinus pressure, sinus pain and sore throat. Negative for ear discharge, ear pain, postnasal drip, rhinorrhea and voice change.   Respiratory:  Negative for cough and chest tightness.   Gastrointestinal: Negative.   Genitourinary: Negative.     Physical Exam Triage Vital Signs ED Triage Vitals [05/29/21 1504]  Enc Vitals Group     BP 119/82     Pulse Rate (!) 102     Resp      Temp 98.5 F (36.9 C)     Temp Source Oral     SpO2 98 %     Weight      Height      Head Circumference      Peak Flow      Pain Score      Pain Loc      Pain Edu?      Excl. in GC?    No data found.  Updated Vital Signs BP 119/82 (BP Location: Left Arm)   Pulse (!) 102   Temp 98.5 F (36.9 C) (Oral)   LMP 05/15/2021 (Approximate)   SpO2 98%   Visual Acuity Right Eye Distance:   Left Eye Distance:   Bilateral Distance:    Right Eye Near:   Left Eye Near:    Bilateral Near:     Physical Exam Vitals and nursing note reviewed.  Constitutional:      Appearance: Normal appearance.  HENT:     Right Ear: Tympanic membrane normal.     Left Ear: Tympanic membrane normal.  Eyes:     Pupils: Pupils are equal, round, and reactive to light.  Cardiovascular:     Rate and Rhythm: Normal rate and regular rhythm.     Pulses: Normal pulses.     Heart sounds: Normal heart sounds.  Pulmonary:     Effort: Pulmonary effort  is normal.     Breath sounds: Normal breath sounds.  Abdominal:     General: Abdomen is flat. Bowel sounds are normal.  Neurological:     Mental Status: She is alert.     UC Treatments / Results  Labs (all labs ordered are listed, but only abnormal results are displayed) Labs Reviewed - No data to display  EKG   Radiology No results found.  Procedures Procedures (including critical care time)  Medications Ordered in UC Medications - No data to display  Initial Impression / Assessment and Plan / UC Course  I have reviewed the triage vital signs and the nursing notes.  Pertinent labs & imaging results that were available during my care of the patient were reviewed by me and considered in my medical decision making (see chart for details).     1.  Sinus pressure: Fluticasone nasal spray Saline nasal rinse Mucinex as needed Humidifier use COVID testing avoided because of a history of nasal septum surgery Return to urgent care if symptoms persist or worsens. Final Clinical Impressions(s) / UC Diagnoses   Final diagnoses:  Sinus pressure     Discharge Instructions      Continue saline nasal rinse Use medications as prescribed If symptoms worsen please return to urgent care to be reevaluated.   ED Prescriptions     Medication Sig Dispense Auth. Provider   fluticasone (FLONASE) 50 MCG/ACT nasal spray Place  1 spray into both nostrils daily. 16 g Merrilee Jansky, MD   guaiFENesin (MUCINEX) 600 MG 12 hr tablet Take 1 tablet (600 mg total) by mouth 2 (two) times daily for 15 days. 30 tablet Jerick Khachatryan, Britta Mccreedy, MD      PDMP not reviewed this encounter.   Merrilee Jansky, MD 05/29/21 [phone]

## 2021-05-29 NOTE — ED Triage Notes (Signed)
Pt was seen 05/15/21 was better for a few days, but sxs have returned. Pt c/o sinus pressure/pain, chest congestion, and ST.

## 2021-06-11 ENCOUNTER — Other Ambulatory Visit: Payer: Self-pay | Admitting: Family

## 2021-06-11 DIAGNOSIS — F320 Major depressive disorder, single episode, mild: Secondary | ICD-10-CM

## 2021-06-15 ENCOUNTER — Other Ambulatory Visit: Payer: Self-pay | Admitting: Family

## 2021-06-15 DIAGNOSIS — B029 Zoster without complications: Secondary | ICD-10-CM

## 2021-06-21 ENCOUNTER — Other Ambulatory Visit: Payer: Self-pay | Admitting: Obstetrics and Gynecology

## 2021-06-21 DIAGNOSIS — N63 Unspecified lump in unspecified breast: Secondary | ICD-10-CM

## 2021-07-04 ENCOUNTER — Ambulatory Visit (INDEPENDENT_AMBULATORY_CARE_PROVIDER_SITE_OTHER): Payer: BC Managed Care – PPO | Admitting: Gastroenterology

## 2021-07-04 ENCOUNTER — Encounter: Payer: Self-pay | Admitting: Gastroenterology

## 2021-07-04 VITALS — BP 119/80 | HR 91 | Temp 98.4°F | Wt 148.8 lb

## 2021-07-04 DIAGNOSIS — K5900 Constipation, unspecified: Secondary | ICD-10-CM | POA: Diagnosis not present

## 2021-07-04 NOTE — Progress Notes (Signed)
[name], MD [address]  [address]  [place], [place] [address]  Main: [phone]  Fax: [phone]   Primary Care Physician: Allegra Grana, FNP   Chief Complaint  Patient presents with   Follow-up   Constipation    Pt reports she never started Trulance and has been averaging 3 BM per week... Some nausea- but related to her migraines, Denies vomiting, abd pain or bloating, blood in stools    HPI: Alejandra Cook is a 40 y.o. female here for follow-up of constipation.  Trulance samples were given on last visit.  Patient states she only took it for 1 day.  However, states has been doing well as far as constipation goes without any medications.  Reports making some diet changes and is now having a bowel movement 3-4 times a week that is soft.  No blood in stool.  No nausea or vomiting.  No straining with her bowel movements.  No abdominal pain.  No dysphagia or frequent heartburn.   ROS: All ROS reviewed and negative except as per HPI   Past Medical History:  Diagnosis Date   Allergy    Dr. Gwen Pounds, gold and nickel allergy   Anxiety    Bulging discs    Degenerative disc disease, lumbar    Depression    off of zoloft for 1 year, had problems with depression and anxiety after last child   Fibromyalgia    after pregnancy, Followed by [facility]   Fibromyalgia    Headache(784.0)    Dr. Neale Burly at [facility]; migraines since elementary    IBS (irritable bowel syndrome)    Intrahepatic cholestasis of pregnancy in third trimester, antepartum    Spontaneous abortion in second trimester Dec 2014    Past Surgical History:  Procedure Laterality Date   COLONOSCOPY     normal   COLPOSCOPY  2002   CYSTOSCOPY     ureteral stent inserted   DILATION AND EVACUATION N/A 08/07/2013   Procedure: DILATATION AND EVACUATION;  Surgeon: Zelphia Cairo, MD;  Location: [facility];  Service: Gynecology;  Laterality: N/A;   LAPAROSCOPY N/A 03/20/2013   Procedure:  LAPAROSCOPY OPERATIVE  IUD REMOVAL;  Surgeon: Zelphia Cairo, MD;  Location: [facility];  Service: Gynecology;  Laterality: N/A;  Intraabdominal from [place]   NASAL SINUS SURGERY     PILONIDAL CYST EXCISION     VAGINAL DELIVERY  2011    Prior to Admission medications   Medication Sig Start Date End Date Taking? Authorizing Provider  AIMOVIG 140 MG/ML SOAJ SMARTSIG:140 Milligram(s) SUB-Q Every 4 Weeks 05/01/20  Yes [provider]  ASPERCREME LIDOCAINE 4 % PTCH SMARTSIG:1 Patch(s) Topical Every 12 Hours PRN 03/23/20  Yes [provider]  baclofen (LIORESAL) 10 MG tablet Take 10 mg by mouth 2 (two) times daily. 02/26/21  Yes [provider]  buPROPion (WELLBUTRIN XL) 300 MG 24 hr tablet TAKE 1 TABLET BY MOUTH EVERY DAY IN THE MORNING 06/12/21  Yes Arnett, Lyn Records, FNP  butalbital-acetaminophen-caffeine (FIORICET) 50-325-40 MG tablet Take 1 tablet by mouth as needed. 08/04/19  Yes [provider]  cyclobenzaprine (FLEXERIL) 5 MG tablet Take 1 tablet (5 mg total) by mouth 3 (three) times daily as needed for muscle spasms. 02/04/19  Yes Guse, Janna Arch, FNP  fluticasone (FLONASE) 50 MCG/ACT nasal spray Place 1 spray into both nostrils daily. 05/29/21  Yes Lamptey, Britta Mccreedy, MD  Fremanezumab-vfrm 225 MG/1.5ML SOAJ Inject 1.5 mLs into the skin every 28 (  twenty-eight) days. 10/05/20  Yes [provider]  gabapentin (NEURONTIN) 100 MG capsule TAKE ONE TABLET IN THE MORNING AND ONE TABLET AT NOON FOR POST HERPETIC PAIN. 06/16/21  Yes Arnett, Lyn Records, FNP  LO LOESTRIN FE 1 MG-10 MCG / 10 MCG tablet Take 1 tablet by mouth daily. as directed 12/19/16  Yes [provider]  methylPREDNISolone (MEDROL DOSEPAK) 4 MG TBPK tablet Take by mouth. 02/26/21  Yes [provider]  ondansetron (ZOFRAN) 4 MG tablet TAKE 1 TABLET (4 MG TOTAL) BY MOUTH DAILY AS NEEDED FOR NAUSEA OR VOMITING. 02/15/18  Yes Arnett, Lyn Records, FNP  promethazine (PHENERGAN) 12.5 MG tablet  Take 1-2 tablets (12.5-25 mg total) by mouth once as needed for up to 1 dose for nausea or vomiting. With migraines. 10/30/17  Yes McLean-Scocuzza, Pasty Spillers, MD  rizatriptan (MAXALT) 10 MG tablet Take 1 tablet (10 mg total) by mouth as needed for migraine. May repeat in 2 hours if needed. Max dose 3 pills in 24 hours 07/25/17  Yes McLean-Scocuzza, Pasty Spillers, MD  sertraline (ZOLOFT) 100 MG tablet Take 100 mg by mouth daily. 11/26/16  Yes [provider]  UBRELVY 100 MG TABS Take 1 tablet by mouth daily. 07/22/20  Yes [provider]  zonisamide (ZONEGRAN) 100 MG capsule take 2 tabs at hs 12/18/16  Yes [provider]  gabapentin (NEURONTIN) 300 MG capsule Take 300 mg by mouth at bedtime.  09/10/17 02/27/21  [provider]    Family History  Problem Relation Age of Onset   Healthy Mother    Hypertension Father    Cancer Maternal Grandmother        cancer, breast   Cancer Paternal Grandmother        breast   Healthy Sister    Hypertension Paternal Grandfather    Healthy Daughter    Healthy Son      Social History   Tobacco Use   Smoking status: Never   Smokeless tobacco: Never  Vaping Use   Vaping Use: Never used  Substance Use Topics   Alcohol use: No    Alcohol/week: 0.0 standard drinks   Drug use: Never    Allergies as of 07/04/2021   (No Known Allergies)    Physical Examination:  Constitutional: General:   Alert,  Well-developed, well-nourished, pleasant and cooperative in NAD BP 119/80   Pulse 91   Temp 98.4 F (36.9 C) (Oral)   Wt 148 lb 12.8 oz (67.5 kg)   BMI 26.36 kg/m   Respiratory: Normal respiratory effort  Gastrointestinal:  Soft, non-tender and non-distended without masses, hepatosplenomegaly or hernias noted.  No guarding or rebound tenderness.     Cardiac: No clubbing or edema.  No cyanosis. Normal posterior tibial pedal pulses noted.  Psych:  Alert and cooperative. Normal mood and affect.  Musculoskeletal:  Normal  gait. Head normocephalic, atraumatic. Symmetrical without gross deformities. 5/5 Lower extremity strength bilaterally.  Skin: Warm. Intact without significant lesions or rashes. No jaundice.  Neck: Supple, trachea midline  Lymph: No cervical lymphadenopathy  Psych:  Alert and oriented x3, Alert and cooperative. Normal mood and affect.  Labs: CMP     Component Value Date/Time   NA 141 02/25/2020 1728   K 3.8 02/25/2020 1728   CL 108 02/25/2020 1728   CO2 24 02/25/2020 1728   GLUCOSE 101 (H) 02/25/2020 1728   BUN 15 02/25/2020 1728   CREATININE 1.03 (H) 02/25/2020 1728   CALCIUM 9.1 02/25/2020 1728   PROT  7.2 02/25/2020 1728   ALBUMIN 4.8 02/25/2020 1728   AST 17 02/25/2020 1728   ALT 12 02/25/2020 1728   ALKPHOS 62 02/25/2020 1728   BILITOT 0.7 02/25/2020 1728   GFRNONAA >60 02/25/2020 1728   GFRAA >60 02/25/2020 1728   Lab Results  Component Value Date   WBC 7.7 02/25/2020   HGB 15.1 (H) 02/25/2020   HCT 43.5 02/25/2020   MCV 94.4 02/25/2020   PLT 231 02/25/2020    Imaging Studies:   Assessment and Plan:   Lorrena Goranson is a 40 y.o. y/o female here for follow-up of constipation  Patient is not on any pharmacologic therapy as Trulance was prescribed after last visit but patient never took this.  Previously was on Linzess and Amitiza and had failed both.  However, describes not having any further constipation after diet changes despite not being on any specific pharmacologic therapy  Continue high-fiber diet  No alarm symptoms present  If constipation recurs, patient advised to notify us and she verbalized understanding  Follow-up in 6 months or earlier if needed    Dr [name]

## 2021-07-06 ENCOUNTER — Encounter: Payer: Self-pay | Admitting: Rheumatology

## 2021-07-06 ENCOUNTER — Encounter: Payer: Self-pay | Admitting: Physician Assistant

## 2021-07-06 ENCOUNTER — Other Ambulatory Visit: Payer: Self-pay

## 2021-07-06 ENCOUNTER — Ambulatory Visit: Payer: BC Managed Care – PPO | Admitting: Physician Assistant

## 2021-07-06 VITALS — BP 109/73 | HR 108 | Resp 14 | Ht 62.0 in | Wt 147.0 lb

## 2021-07-06 DIAGNOSIS — R5383 Other fatigue: Secondary | ICD-10-CM

## 2021-07-06 DIAGNOSIS — M7711 Lateral epicondylitis, right elbow: Secondary | ICD-10-CM

## 2021-07-06 DIAGNOSIS — M797 Fibromyalgia: Secondary | ICD-10-CM | POA: Diagnosis not present

## 2021-07-06 DIAGNOSIS — M51369 Other intervertebral disc degeneration, lumbar region without mention of lumbar back pain or lower extremity pain: Secondary | ICD-10-CM

## 2021-07-06 DIAGNOSIS — M5136 Other intervertebral disc degeneration, lumbar region: Secondary | ICD-10-CM

## 2021-07-06 DIAGNOSIS — M7062 Trochanteric bursitis, left hip: Secondary | ICD-10-CM

## 2021-07-06 DIAGNOSIS — M7061 Trochanteric bursitis, right hip: Secondary | ICD-10-CM

## 2021-07-06 DIAGNOSIS — I7300 Raynaud's syndrome without gangrene: Secondary | ICD-10-CM

## 2021-07-06 DIAGNOSIS — M7712 Lateral epicondylitis, left elbow: Secondary | ICD-10-CM

## 2021-07-06 DIAGNOSIS — F5101 Primary insomnia: Secondary | ICD-10-CM

## 2021-07-06 NOTE — Patient Instructions (Signed)
Raynaud's Phenomenon °Raynaud's phenomenon is a condition that affects the blood vessels (arteries) that carry blood to the fingers and toes. The arteries that supply blood to the ears, lips, nipples, or the tip of the nose might also be affected. Raynaud's phenomenon causes the arteries to become narrow temporarily (spasm). As a result, the flow of blood to the affected areas is temporarily decreased. This usually occurs in response to cold temperatures or stress. During an attack, the skin in the affected areas turns white, then blue, and finally red. A person may also feel tingling or numbness in those areas. °Attacks usually last for only a brief period, and then the blood flow to the area returns to normal. In most cases, Raynaud's phenomenon does not cause serious health problems. °What are the causes? °In many cases, the cause of this condition is not known. The condition may occur on its own (primary Raynaud's phenomenon) or may be associated with other diseases or factors (secondary Raynaud's phenomenon). °Possible causes may include: °Diseases or medical conditions that damage the arteries. °Injuries and repetitive actions that hurt the hands or feet. °Being exposed to certain chemicals. °Taking medicines that narrow the arteries. °Other medical conditions, such as lupus, scleroderma, rheumatoid arthritis, thyroid problems, blood disorders, Sjogren syndrome, or atherosclerosis. °What increases the risk? °The following factors may make you more likely to develop this condition: °Being 20-40 years old. °Being female. °Having a family history of Raynaud's phenomenon. °Living in a cold climate. °Smoking. °What are the signs or symptoms? °Symptoms of this condition usually occur when you are exposed to cold temperatures or when you have emotional stress. The symptoms may last for a few minutes or up to several hours. They usually affect your fingers but may also affect your toes, nipples, lips, ears, or the tip  of your nose. Symptoms may include: °Changes in skin color. The skin in the affected areas will turn pale or white. The skin may then change from white to bluish to red as normal blood flow returns to the area. °Numbness, tingling, or pain in the affected areas. °In severe cases, symptoms may include: °Skin sores. °Tissues decaying and dying (gangrene). °How is this diagnosed? °This condition may be diagnosed based on: °Your symptoms and medical history. °A physical exam. During the exam, you may be asked to put your hands in cold water to check for a reaction to cold temperature. °Tests, such as: °Blood tests to check for other diseases or conditions. °A test to check the movement of blood through your arteries and veins (vascular ultrasound). °A test in which the skin at the base of your fingernail is examined under a microscope (nailfold capillaroscopy). °How is this treated? °During an episode, you can take actions to help symptoms go away faster. Options include moving your arms around in a windmill pattern, warming your fingers under warm water, or placing your fingers in a warm body fold, such as your armpit. °Long-term treatment for this condition often involves making lifestyle changes and taking steps to control your exposure to cold temperature. For more severe cases, medicine (calcium channel blockers) may be used to improve blood circulation. °Follow these instructions at home: °Avoiding cold temperatures °Take these steps to avoid exposure to cold: °If possible, stay indoors during cold weather. °When you go outside during cold weather, dress in layers and wear mittens, a hat, a scarf, and warm footwear. °Wear mittens or gloves when handling ice or frozen food. °Use holders for glasses or cans containing cold   drinks. °Let warm water run for a while before taking a shower or bath. °Warm up the car before driving in cold weather. °Lifestyle °If possible, avoid stressful and emotional situations. Try to  find ways to manage your stress, such as: °Exercise. °Yoga. °Meditation. °Biofeedback. °Do not use any products that contain nicotine or tobacco. These products include cigarettes, chewing tobacco, and vaping devices, such as e-cigarettes. If you need help quitting, ask your health care provider. °Avoid secondhand smoke. °Limit your use of caffeine. °Switch to decaffeinated coffee, tea, and soda. °Avoid chocolate. °Avoid vibrating tools and machinery. °General instructions °Protect your hands and feet from injuries, cuts, or bruises. °Avoid wearing tight rings or wristbands. °Wear loose fitting socks and comfortable, roomy shoes. °Take over-the-counter and prescription medicines only as told by your health care provider. °Where to find support °Raynaud's Association: www.raynauds.org °Where to find more information °National Institute of Arthritis and Musculoskeletal and Skin Diseases: www.niams.nih.gov °Contact a health care provider if: °Your discomfort becomes worse despite lifestyle changes. °You develop sores on your fingers or toes that do not heal. °You have breaks in the skin on your fingers or toes. °You have a fever. °You have pain or swelling in your joints. °You have a rash. °Your symptoms occur on only one side of your body. °Get help right away if: °Your fingers or toes turn black. °You have severe pain in the affected areas. °These symptoms may represent a serious problem that is an emergency. Do not wait to see if the symptoms will go away. Get medical help right away. Call your local emergency services (911 in the [place] Do not drive yourself to the hospital. °Summary °Raynaud's phenomenon is a condition that affects the arteries that carry blood to the fingers, toes, ears, lips, nipples, or the tip of the nose. °In many cases, the cause of this condition is not known. °Symptoms of this condition include changes in skin color along with numbness and tingling in the affected area. °Treatment for this  condition includes lifestyle changes and reducing exposure to cold temperatures. Medicines may be used for severe cases of the condition. °Contact your health care provider if your condition worsens despite treatment. °This information is not intended to replace advice given to you by your health care provider. Make sure you discuss any questions you have with your health care provider. °Document Revised: 09/27/2020 Document Reviewed: 09/27/2020 °Elsevier Patient Education © 2022 [employer] ° °

## 2021-07-06 NOTE — Progress Notes (Signed)
Office Visit Note  Patient: Alejandra Cook             Date of Birth: Dec 28, 1980           MRN: [id number]             PCP: Burnard Hawthorne, FNP Referring: Burnard Hawthorne, FNP Visit Date: 07/06/2021 Occupation: @GUAROCC @  Subjective:  Fatigue   History of Present Illness: Alejandra Cook is a 40 y.o. female with history of fibromyalgia and DDD.  The patient was last seen in the office on 01/21/2018 and had a virtual visit on 12/03/2018.  She reports that over the past several months she has been experiencing increased fatigue.  She states that since September 2022 she has noticed that her fingers and toes remain very cold.  She states that at times she feels pins-and-needles and tingling pain in her fingertips and toes when they are cold.  She has also noticed color changes in her fingertips and toes intermittently.  She feels that since the weather has become colder her symptoms have been more frequent and severe.  She denies any digital ulcerations or signs of gangrene.  She denies any skin thickening or tightness.  She has not had any recent rashes.  She denies any sores in her mouth or nose.  She has not had any dysphagia or constipation.  She has noticed some increased mouth dryness but denies any eye dryness.  She denies any family history of autoimmune disease. She continues to experience myalgias and muscle tenderness due to fibromyalgia.  She has some discomfort in both elbow joints as well as from trochanteric bursitis of both hips.  She experiences some pain at night when laying on her sides.  She has been taking gabapentin and Flexeril as prescribed.  She states that her migraines have been well controlled lately on the current treatment regimen.     Activities of Daily Living:  Patient reports morning stiffness for 0  none .   Patient Denies nocturnal pain.  Difficulty dressing/grooming: Denies Difficulty climbing stairs: Denies Difficulty getting out of chair:  Denies Difficulty using hands for taps, buttons, cutlery, and/or writing: Denies  Review of Systems  Constitutional:  Positive for fatigue.  HENT:  Positive for mouth dryness. Negative for mouth sores and nose dryness.   Eyes:  Negative for pain, visual disturbance and dryness.  Respiratory:  Negative for cough, hemoptysis, shortness of breath and difficulty breathing.   Cardiovascular:  Negative for chest pain, palpitations, hypertension and swelling in legs/feet.  Gastrointestinal:  Positive for constipation and diarrhea. Negative for blood in stool.  Endocrine: Negative for increased urination.  Genitourinary:  Negative for difficulty urinating and painful urination.  Musculoskeletal:  Positive for joint pain, joint pain and muscle tenderness. Negative for joint swelling, myalgias, muscle weakness, morning stiffness and myalgias.  Skin:  Positive for color change. Negative for pallor, rash, hair loss, nodules/bumps, skin tightness, ulcers and sensitivity to sunlight.  Neurological:  Negative for dizziness, numbness, headaches and weakness.  Hematological:  Positive for bruising/bleeding tendency. Negative for swollen glands.  Psychiatric/Behavioral:  Positive for sleep disturbance. Negative for depressed mood. The patient is not nervous/anxious.    PMFS History:  Patient Active Problem List   Diagnosis Date Noted   Shingles 02/27/2021   Sinus tachycardia 07/21/2020   Heart palpitations 06/26/2020   Otitis media 06/02/2020   Low back pain 02/23/2020   Amenorrhea 01/25/2020   Depression, major, single episode, mild ([place]) 11/23/2019  Irritable bowel syndrome 09/10/2019   Intractable migraine with aura without status migrainosus 11/07/2018   Contusion of left hand 09/09/2018   Polycythemia 12/01/2017   History of migraine 12/20/2016   Other fatigue 12/20/2016   Primary insomnia 12/20/2016   History of IBS 12/20/2016   History of renal calculi 12/20/2016   History of sinusitis  12/20/2016   Bug bites 01/23/2016   Bronchitis 08/11/2014   Routine general medical examination at a health care facility 01/21/2013   Fibromyalgia 01/17/2012   Allergic rhinitis 01/17/2012   Migraine headache 05/12/2007   DDD (degenerative disc disease), lumbosacral 05/12/2007    Past Medical History:  Diagnosis Date   Allergy    Dr. Nehemiah Massed, gold and nickel allergy   Anxiety    Bulging discs    Degenerative disc disease, lumbar    Depression    off of zoloft for 1 year, had problems with depression and anxiety after last child   Fibromyalgia    after pregnancy, Followed by [facility]   Fibromyalgia    Headache(784.0)    Dr. Domingo Cocking at [facility]; migraines since elementary    IBS (irritable bowel syndrome)    Intrahepatic cholestasis of pregnancy in third trimester, antepartum    Spontaneous abortion in second trimester Dec 2014    Family History  Problem Relation Age of Onset   Healthy Mother    Hypertension Father    Cancer Maternal Grandmother        cancer, breast   Cancer Paternal Grandmother        breast   Healthy Sister    Hypertension Paternal Grandfather    Healthy Daughter    Healthy Son    Past Surgical History:  Procedure Laterality Date   COLONOSCOPY     normal   COLPOSCOPY  2002   CYSTOSCOPY     ureteral stent inserted   DILATION AND EVACUATION N/A 08/07/2013   Procedure: DILATATION AND EVACUATION;  Surgeon: Marylynn Pearson, MD;  Location: [facility];  Service: Gynecology;  Laterality: N/A;   LAPAROSCOPY N/A 03/20/2013   Procedure: LAPAROSCOPY OPERATIVE  IUD REMOVAL;  Surgeon: Marylynn Pearson, MD;  Location: [facility];  Service: Gynecology;  Laterality: N/A;  Intraabdominal from [facility]   NASAL SINUS SURGERY     PILONIDAL CYST EXCISION     VAGINAL DELIVERY  2011   Social History   Social History Narrative   Lives in [place]. 10YO daughter. 53 YO son.      Works- 10th and 11th; [employer].      Immunization History  Administered  Date(s) Administered   Hepatitis B 09/20/1997, 10/18/1997, 03/22/1998   Hepatitis B, adult 09/20/1997, 10/18/1997, 03/22/1998, 04/30/2014, 06/01/2014, 11/02/2014   Influenza Nasal 05/07/2019   Influenza Split 08/06/2013   Influenza,inj,Quad PF,6+ Mos 04/28/2014, 06/22/2019   Influenza,inj,quad, With Preservative 05/31/2017, 05/06/2018   Influenza-Unspecified 05/23/2012, 04/28/2014, 04/14/2016, 06/06/2017, 06/07/2018   Moderna Sars-Covid-2 Vaccination 10/02/2019, 10/30/2019   PPD Test 04/28/2014   Td 10/05/1995   Tdap 01/16/2010     Objective: Vital Signs: BP 109/73 (BP Location: Left Arm, Patient Position: Sitting, Cuff Size: Normal)   Pulse (!) 108   Resp 14   Ht 5\' 2"  (1.575 m)   Wt 147 lb (66.7 kg)   BMI 26.89 kg/m    Physical Exam Vitals and nursing note reviewed.  Constitutional:      Appearance: She is well-developed.  HENT:     Head: Normocephalic and atraumatic.  Eyes:     Conjunctiva/sclera: Conjunctivae normal.  Cardiovascular:     Rate and Rhythm: Normal rate and regular rhythm.     Heart sounds: Normal heart sounds.  Pulmonary:     Effort: Pulmonary effort is normal.     Breath sounds: Normal breath sounds.  Abdominal:     General: Bowel sounds are normal.     Palpations: Abdomen is soft.  Musculoskeletal:     Cervical back: Normal range of motion.  Skin:    General: Skin is warm and dry.     Capillary Refill: Capillary refill takes less than 2 seconds.     Comments: No signs of sclerodactyly.   No digital ulcerations or signs of gangrene.  No nailbed capillary changes noted using the dermatoscope.  Neurological:     Mental Status: She is alert and oriented to person, place, and time.  Psychiatric:        Behavior: Behavior normal.     Musculoskeletal Exam: C-spine, thoracic spine, and lumbar spine good ROM.  Trapezius muscle tension and tenderness bilaterally.  Shoulder joints, elbow joints, wrist joints, MCPs, PIPs, and DIPs good ROM with no  synovitis.  Complete fist formation bilaterally.  Hip joints have good range of motion with no groin pain.  Tenderness over bilateral trochanteric bursa.  Knee joints have good range of motion with no warmth or effusion.  Ankle joints have good range of motion with no tenderness or joint swelling.  CDAI Exam: CDAI Score: -- Patient Global: --; Provider Global: -- Swollen: --; Tender: -- Joint Exam 07/06/2021   No joint exam has been documented for this visit   There is currently no information documented on the homunculus. Go to the Rheumatology activity and complete the homunculus joint exam.  Investigation: No additional findings.  Imaging: No results found.  Recent Labs: Lab Results  Component Value Date   WBC 7.7 02/25/2020   HGB 15.1 (H) 02/25/2020   PLT 231 02/25/2020   NA 141 02/25/2020   K 3.8 02/25/2020   CL 108 02/25/2020   CO2 24 02/25/2020   GLUCOSE 101 (H) 02/25/2020   BUN 15 02/25/2020   CREATININE 1.03 (H) 02/25/2020   BILITOT 0.7 02/25/2020   ALKPHOS 62 02/25/2020   AST 17 02/25/2020   ALT 12 02/25/2020   PROT 7.2 02/25/2020   ALBUMIN 4.8 02/25/2020   CALCIUM 9.1 02/25/2020   GFRAA >60 02/25/2020    Speciality Comments: No specialty comments available.  Procedures:  No procedures performed Allergies: Patient has no known allergies.   Assessment / Plan:     Visit Diagnoses: Fibromyalgia: She experiences intermittent myalgias and muscle tenderness due to fibromyalgia.  Her myofascial pain is most severe in both trapezius muscles.  She is also having discomfort due to trochanter bursitis of both hips.  She has difficulty lying on her sides at night due to nocturnal pain.  She has had massages in the past and has been performing stretching exercises.  She takes 5 mg 3 times daily as needed for muscle spasms and gabapentin 300 mg at bedtime.  She has been experiencing increased fatigue over the past several months.  Discussed the importance of regular  exercise and good sleep hygiene.  Other fatigue: She has been experiencing increased fatigue over the past several months.  No identifiable trigger.  AVISE labs will be obtained for further evaluation.  Primary insomnia: She experiences occasional nocturnal pain which contributes to her insomnia.  Discussed the importance of good sleep hygiene.  She takes gabapentin 300 mg at bedtime.  Lateral epicondylitis of both elbows: She has tenderness to palpation over the lateral epicondyles of both elbows.   DDD (degenerative disc disease), lumbar: She is not experiencing any increased discomfort in her lower back at this time.  No symptoms of radiculopathy.   Trochanteric bursitis of both hips: She has tenderness over bilateral trochanteric bursa.  Discussed the importance of performing stretching exercises daily.   Raynaud's phenomenon without gangrene: She presents today with symptoms consistent with new onset Raynaud's phenomenon.  She has noticed some color changes in her fingertips and toes intermittently with the cooler weather temperatures starting in September 2022.  She has also noticed some numbness as well as pins-and-needles in her fingertips and toes during these episodes.  She has not had any digital ulcerations or signs of gangrene.   On examination there were no signs of sclerodactyly, telangiectasias, or nailbed capillary changes noted using the dermatoscope.   She has no personal or family history of autoimmune disease.   No signs or symptoms of inflammatory arthritis, dysphagia, constipation, SOB, or pleuritic chest pain.   AVISE lab work will be obtained for further evaluation and management.  Orders were provided to the patient.  She was also given information about Raynaud's phenomenon to review.   Conservative treatment options were discussed in detail.  Discussed the importance of avoiding triggers including extreme cold temperatures, emotional stress, and exposure to tobacco  smoke.  I also discussed the importance of keeping her core body temperature warm as well as wearing gloves and thick socks. All questions were addressed. She was advised to notify us if she develops any new or worsening symptoms.  She will follow up in 4 weeks.     Orders: No orders of the defined types were placed in this encounter.  No orders of the defined types were placed in this encounter.     Follow-Up Instructions: Return in 4 weeks (on 08/03/2021) for Fibromyalgia, DDD.   Ofilia Neas, PA-C  Note - This record has been created using Dragon software.  Chart creation errors have been sought, but may not always  have been located. Such creation errors do not reflect on  the standard of medical care.

## 2021-07-09 ENCOUNTER — Ambulatory Visit
Admission: EM | Admit: 2021-07-09 | Discharge: 2021-07-09 | Disposition: A | Discharge status: Home / Self Care | Payer: BC Managed Care – PPO | Attending: Emergency Medicine | Admitting: Emergency Medicine

## 2021-07-09 ENCOUNTER — Encounter: Payer: Self-pay | Admitting: Emergency Medicine

## 2021-07-09 DIAGNOSIS — B349 Viral infection, unspecified: Secondary | ICD-10-CM | POA: Diagnosis not present

## 2021-07-09 MED ORDER — BENZONATATE 100 MG PO CAPS: 100.0000 mg | ORAL_CAPSULE | Freq: Three times a day (TID) | ORAL | 0 refills | Status: DC | PRN

## 2021-07-09 NOTE — ED Provider Notes (Signed)
[name]    CSN: [id number] Arrival date & time: 07/09/21  [id number]      History   Chief Complaint Chief Complaint  Patient presents with   Cough   Nasal Congestion   Sore Throat   Generalized Body Aches    HPI Alejandra Cook is a 40 y.o. female.  Patient presents with 4-day history of low-grade fever, body aches, congestion, sore throat, cough.  T-max 99.  She denies rash, shortness of breath, vomiting, diarrhea, or other symptoms.  OTC cold medication taken at home.  Patient was seen at this urgent care on 05/15/2021; diagnosed with otitis media; treated with cefdinir and fluconazole.  She was seen here again on 05/29/2021; diagnosed with sinus pressure; treated with fluconazole nasal spray and guaifenesin.  Her medical history includes migraine headaches, fibromyalgia, DDD, fatigue, IBS, kidney stones, depression, back pain, allergies.    The history is provided by the patient and medical records.   Past Medical History:  Diagnosis Date   Allergy    Dr. Nehemiah Massed, gold and nickel allergy   Anxiety    Bulging discs    Degenerative disc disease, lumbar    Depression    off of zoloft for 1 year, had problems with depression and anxiety after last child   Fibromyalgia    after pregnancy, Followed by [facility]   Fibromyalgia    Headache(784.0)    Dr. Domingo Cocking at [facility]; migraines since elementary    IBS (irritable bowel syndrome)    Intrahepatic cholestasis of pregnancy in third trimester, antepartum    Spontaneous abortion in second trimester Dec 2014    Patient Active Problem List   Diagnosis Date Noted   Shingles 02/27/2021   Sinus tachycardia 07/21/2020   Heart palpitations 06/26/2020   Otitis media 06/02/2020   Low back pain 02/23/2020   Amenorrhea 01/25/2020   Depression, major, single episode, mild ([facility]) 11/23/2019   Irritable bowel syndrome 09/10/2019   Intractable migraine with aura without status migrainosus 11/07/2018    Contusion of left hand 09/09/2018   Polycythemia 12/01/2017   History of migraine 12/20/2016   Other fatigue 12/20/2016   Primary insomnia 12/20/2016   History of IBS 12/20/2016   History of renal calculi 12/20/2016   History of sinusitis 12/20/2016   Bug bites 01/23/2016   Bronchitis 08/11/2014   Routine general medical examination at a health care facility 01/21/2013   Fibromyalgia 01/17/2012   Allergic rhinitis 01/17/2012   Migraine headache 05/12/2007   DDD (degenerative disc disease), lumbosacral 05/12/2007    Past Surgical History:  Procedure Laterality Date   COLONOSCOPY     normal   COLPOSCOPY  2002   CYSTOSCOPY     ureteral stent inserted   DILATION AND EVACUATION N/A 08/07/2013   Procedure: DILATATION AND EVACUATION;  Surgeon: Marylynn Pearson, MD;  Location: [facility];  Service: Gynecology;  Laterality: N/A;   LAPAROSCOPY N/A 03/20/2013   Procedure: LAPAROSCOPY OPERATIVE  IUD REMOVAL;  Surgeon: Marylynn Pearson, MD;  Location: [facility];  Service: Gynecology;  Laterality: N/A;  Intraabdominal from [facility]   NASAL SINUS SURGERY     PILONIDAL CYST EXCISION     VAGINAL DELIVERY  2011    OB History     Gravida  4   Para  2   Term  1   Preterm  1   AB  2   Living  2      SAB  2   IAB  Ectopic      Multiple  0   Live Births  2            Home Medications    Prior to Admission medications   Medication Sig Start Date End Date Taking? Authorizing Provider  benzonatate (TESSALON) 100 MG capsule Take 1 capsule (100 mg total) by mouth 3 (three) times daily as needed for cough. 07/09/21  Yes Sharion Balloon, NP  AIMOVIG 140 MG/ML SOAJ SMARTSIG:140 Milligram(s) SUB-Q Every 4 Weeks Patient not taking: Reported on 07/06/2021 05/01/20   [provider]  ASPERCREME LIDOCAINE 4 % PTCH SMARTSIG:1 Patch(s) Topical Every 12 Hours PRN Patient not taking: Reported on 07/06/2021 03/23/20   [provider]  baclofen (LIORESAL) 10 MG tablet Take 10 mg by  mouth 2 (two) times daily. Patient not taking: Reported on 07/06/2021 02/26/21   [provider]  buPROPion (WELLBUTRIN XL) 300 MG 24 hr tablet TAKE 1 TABLET BY MOUTH EVERY DAY IN THE MORNING 06/12/21   Burnard Hawthorne, FNP  butalbital-acetaminophen-caffeine (FIORICET) 50-325-40 MG tablet Take 1 tablet by mouth as needed. 08/04/19   [provider]  cyclobenzaprine (FLEXERIL) 5 MG tablet Take 1 tablet (5 mg total) by mouth 3 (three) times daily as needed for muscle spasms. 02/04/19   Guse, Jacquelynn Cree, FNP  EMGALITY 120 MG/ML SOAJ Inject into the skin. 07/03/21   [provider]  fluticasone (FLONASE) 50 MCG/ACT nasal spray Place 1 spray into both nostrils daily. 05/29/21   Lamptey, Myrene Galas, MD  Fremanezumab-vfrm 225 MG/1.5ML SOAJ Inject 1.5 mLs into the skin every 28 (twenty-eight) days. Patient not taking: Reported on 07/06/2021 10/05/20   [provider]  gabapentin (NEURONTIN) 100 MG capsule TAKE ONE TABLET IN THE MORNING AND ONE TABLET AT NOON FOR POST HERPETIC PAIN. Patient not taking: Reported on 07/06/2021 06/16/21   Burnard Hawthorne, FNP  gabapentin (NEURONTIN) 300 MG capsule Take 300 mg by mouth at bedtime.  09/10/17 07/06/21  [provider]  ketorolac (TORADOL) 10 MG tablet Take 10 mg by mouth every 6 (six) hours as needed. 03/08/21   [provider]  LO LOESTRIN FE 1 MG-10 MCG / 10 MCG tablet Take 1 tablet by mouth daily. as directed 12/19/16   [provider]  methylPREDNISolone (MEDROL DOSEPAK) 4 MG TBPK tablet Take by mouth. Patient not taking: Reported on 07/06/2021 02/26/21   [provider]  ondansetron (ZOFRAN) 4 MG tablet TAKE 1 TABLET (4 MG TOTAL) BY MOUTH DAILY AS NEEDED FOR NAUSEA OR VOMITING. 02/15/18   Burnard Hawthorne, FNP  promethazine (PHENERGAN) 12.5 MG tablet Take 1-2 tablets (12.5-25 mg total) by mouth once as needed for up to 1 dose for nausea or vomiting. With migraines. 10/30/17   McLean-Scocuzza, Nino Glow,  MD  rizatriptan (MAXALT) 10 MG tablet Take 1 tablet (10 mg total) by mouth as needed for migraine. May repeat in 2 hours if needed. Max dose 3 pills in 24 hours 07/25/17   McLean-Scocuzza, Nino Glow, MD  sertraline (ZOLOFT) 100 MG tablet Take 100 mg by mouth daily. 11/26/16   [provider]  SUMAtriptan (IMITREX) 100 MG tablet Take by mouth. 05/04/21   [provider]  UBRELVY 100 MG TABS Take 1 tablet by mouth daily. Patient not taking: Reported on 07/06/2021 07/22/20   [provider]  zonisamide (ZONEGRAN) 100 MG capsule take 2 tabs at hs 12/18/16   [provider]    Family History Family History  Problem Relation  Age of Onset   Healthy Mother    Hypertension Father    Cancer Maternal Grandmother        cancer, breast   Cancer Paternal Grandmother        breast   Healthy Sister    Hypertension Paternal Grandfather    Healthy Daughter    Healthy Son     Social History Social History   Tobacco Use   Smoking status: Never   Smokeless tobacco: Never  Vaping Use   Vaping Use: Never used  Substance Use Topics   Alcohol use: No    Alcohol/week: 0.0 standard drinks   Drug use: Never     Allergies   Patient has no known allergies.   Review of Systems Review of Systems  Constitutional:  Positive for fever. Negative for chills.  HENT:  Positive for congestion and sore throat. Negative for ear pain.   Respiratory:  Positive for cough. Negative for shortness of breath.   Cardiovascular:  Negative for chest pain and palpitations.  Gastrointestinal:  Negative for diarrhea and vomiting.  Skin:  Negative for color change and rash.  All other systems reviewed and are negative.   Physical Exam Triage Vital Signs ED Triage Vitals  Enc Vitals Group     BP 07/09/21 0905 115/81     Pulse Rate 07/09/21 0905 (!) 103     Resp 07/09/21 0905 18     Temp 07/09/21 0905 98.3 F (36.8 C)     Temp src --      SpO2 07/09/21 0905 98 %     Weight --       Height --      Head Circumference --      Peak Flow --      Pain Score 07/09/21 0911 4     Pain Loc --      Pain Edu? --      Excl. in GC? --    No data found.  Updated Vital Signs BP 115/81   Pulse (!) 103   Temp 98.3 F (36.8 C)   Resp 18   SpO2 98%   Visual Acuity Right Eye Distance:   Left Eye Distance:   Bilateral Distance:    Right Eye Near:   Left Eye Near:    Bilateral Near:     Physical Exam Vitals and nursing note reviewed.  Constitutional:      General: She is not in acute distress.    Appearance: She is well-developed. She is not ill-appearing.  HENT:     Right Ear: Tympanic membrane normal.     Left Ear: Tympanic membrane normal.     Nose: Nose normal.     Mouth/Throat:     Mouth: Mucous membranes are moist.     Pharynx: Oropharynx is clear.  Cardiovascular:     Rate and Rhythm: Normal rate and regular rhythm.     Heart sounds: Normal heart sounds.  Pulmonary:     Effort: Pulmonary effort is normal. No respiratory distress.     Breath sounds: Normal breath sounds.  Abdominal:     Palpations: Abdomen is soft.     Tenderness: There is no abdominal tenderness.  Musculoskeletal:     Cervical back: Neck supple.  Skin:    General: Skin is warm and dry.  Neurological:     Mental Status: She is alert.  Psychiatric:        Mood and Affect: Mood normal.  Behavior: Behavior normal.     UC Treatments / Results  Labs (all labs ordered are listed, but only abnormal results are displayed) Labs Reviewed  COVID-19, FLU A+B NAA    EKG   Radiology No results found.  Procedures Procedures (including critical care time)  Medications Ordered in UC Medications - No data to display  Initial Impression / Assessment and Plan / UC Course  I have reviewed the triage vital signs and the nursing notes.  Pertinent labs & imaging results that were available during my care of the patient were reviewed by me and considered in my medical decision  making (see chart for details).   Viral illness.  COVID and Flu pending.  Instructed patient to self quarantine per CDC guidelines.  Discussed symptomatic treatment including Tessalon Perles, Tylenol or ibuprofen, rest, hydration.  Instructed patient to follow up with PCP if symptoms are not improving.  Patient agrees to plan of care.    Final Clinical Impressions(s) / UC Diagnoses   Final diagnoses:  Viral illness     Discharge Instructions      Your COVID and Flu tests are pending.  You should self quarantine until the test results are back.    Take the [facility] as needed for cough.  Take Tylenol or ibuprofen as needed for fever or discomfort.  Rest and keep yourself hydrated.    Follow-up with your primary care provider if your symptoms are not improving.         ED Prescriptions     Medication Sig Dispense Auth. Provider   benzonatate (TESSALON) 100 MG capsule Take 1 capsule (100 mg total) by mouth 3 (three) times daily as needed for cough. 21 capsule Sharion Balloon, NP      PDMP not reviewed this encounter.   Sharion Balloon, NP 07/09/21 1038

## 2021-07-09 NOTE — Discharge Instructions (Addendum)
Your COVID and Flu tests are pending.  You should self quarantine until the test results are back.    Take the Tessalon Perles as needed for cough.  Take Tylenol or ibuprofen as needed for fever or discomfort.  Rest and keep yourself hydrated.    Follow-up with your primary care provider if your symptoms are not improving.   

## 2021-07-09 NOTE — ED Triage Notes (Signed)
Pt here with Cough, congestion, sore throat, and low grade fever with body aches  x 4 days. Pt is a Runner, broadcasting/film/video.

## 2021-07-10 LAB — COVID-19, FLU A+B NAA
Influenza A, NAA: NOT DETECTED
Influenza B, NAA: NOT DETECTED
SARS-CoV-2, NAA: NOT DETECTED

## 2021-07-25 NOTE — Progress Notes (Signed)
Office Visit Note  Patient: Alejandra Cook             Date of Birth: Dec 12, 1980           MRN: [id number]             PCP: Burnard Hawthorne, FNP Referring: Burnard Hawthorne, FNP Visit Date: 08/08/2021 Occupation: @GUAROCC @  Subjective:  Discuss AVISE results   History of Present Illness: Alejandra Cook is a 40 y.o. female with history of fibromyalgia and DDD.  Patient presents today to discuss AVISE lab results.  She continues to have persistent fatigue and intermittent symptoms of Raynaud's.  She is also been having intermittent palpitations and has an upcoming appointment with cardiology for further evaluation.  She denies any other new or worsening symptoms since her last office visit.  She has not had any recent rashes, skin tightness or thickening, oral or nasal ulcerations, sicca symptoms, shortness of breath, pleuritic chest pain, or joint swelling.  Activities of Daily Living:  Patient reports morning stiffness for 20 minutes.   Patient Denies nocturnal pain.  Difficulty dressing/grooming: Denies Difficulty climbing stairs: Denies Difficulty getting out of chair: Denies Difficulty using hands for taps, buttons, cutlery, and/or writing: Denies  Review of Systems  Constitutional:  Positive for fatigue.  HENT:  Negative for mouth sores, mouth dryness and nose dryness.   Eyes:  Negative for pain, itching and dryness.  Respiratory:  Negative for shortness of breath and difficulty breathing.   Cardiovascular:  Positive for palpitations. Negative for chest pain.  Gastrointestinal:  Positive for constipation and diarrhea. Negative for blood in stool.  Endocrine: Negative for increased urination.  Genitourinary:  Negative for difficulty urinating.  Musculoskeletal:  Positive for joint pain, joint pain, myalgias, morning stiffness, muscle tenderness and myalgias. Negative for joint swelling.  Skin:  Positive for color change. Negative for rash and redness.   Allergic/Immunologic: Positive for susceptible to infections.  Neurological:  Positive for headaches and memory loss. Negative for dizziness, numbness and weakness.  Hematological:  Positive for bruising/bleeding tendency.  Psychiatric/Behavioral:  Positive for confusion.    PMFS History:  Patient Active Problem List   Diagnosis Date Noted   Shingles 02/27/2021   Sinus tachycardia 07/21/2020   Heart palpitations 06/26/2020   Otitis media 06/02/2020   Low back pain 02/23/2020   Amenorrhea 01/25/2020   Depression, major, single episode, mild (HCC) 11/23/2019   Irritable bowel syndrome 09/10/2019   Intractable migraine with aura without status migrainosus 11/07/2018   Contusion of left hand 09/09/2018   Polycythemia 12/01/2017   History of migraine 12/20/2016   Other fatigue 12/20/2016   Primary insomnia 12/20/2016   History of IBS 12/20/2016   History of renal calculi 12/20/2016   History of sinusitis 12/20/2016   Bug bites 01/23/2016   Bronchitis 08/11/2014   Routine general medical examination at a health care facility 01/21/2013   Fibromyalgia 01/17/2012   Allergic rhinitis 01/17/2012   Migraine headache 05/12/2007   DDD (degenerative disc disease), lumbosacral 05/12/2007    Past Medical History:  Diagnosis Date   Allergy    Dr. Nehemiah Massed, gold and nickel allergy   Anxiety    Bulging discs    Degenerative disc disease, lumbar    Depression    off of zoloft for 1 year, had problems with depression and anxiety after last child   Fibromyalgia    after pregnancy, Followed by [facility]   Fibromyalgia    Headache(784.0)    Dr. Domingo Cocking  at [facility]; migraines since elementary    IBS (irritable bowel syndrome)    Intrahepatic cholestasis of pregnancy in third trimester, antepartum    Spontaneous abortion in second trimester Dec 2014    Family History  Problem Relation Age of Onset   Healthy Mother    Hypertension Father    Cancer Maternal Grandmother         cancer, breast   Cancer Paternal Grandmother        breast   Healthy Sister    Hypertension Paternal Grandfather    Healthy Daughter    Healthy Son    Past Surgical History:  Procedure Laterality Date   COLONOSCOPY     normal   COLPOSCOPY  2002   CYSTOSCOPY     ureteral stent inserted   DILATION AND EVACUATION N/A 08/07/2013   Procedure: DILATATION AND EVACUATION;  Surgeon: Marylynn Pearson, MD;  Location: [facility];  Service: Gynecology;  Laterality: N/A;   LAPAROSCOPY N/A 03/20/2013   Procedure: LAPAROSCOPY OPERATIVE  IUD REMOVAL;  Surgeon: Marylynn Pearson, MD;  Location: [facility];  Service: Gynecology;  Laterality: N/A;  Intraabdominal from [place]   NASAL SINUS SURGERY     PILONIDAL CYST EXCISION     VAGINAL DELIVERY  2011   Social History   Social History Narrative   Lives in [place]. 10YO daughter. 88 YO son.      Works- 10th and 11th; [employer].      Immunization History  Administered Date(s) Administered   Hepatitis B 09/20/1997, 10/18/1997, 03/22/1998   Hepatitis B, adult 09/20/1997, 10/18/1997, 03/22/1998, 04/30/2014, 06/01/2014, 11/02/2014   Influenza Nasal 05/07/2019   Influenza Split 08/06/2013   Influenza,inj,Quad PF,6+ Mos 04/28/2014, 06/22/2019   Influenza,inj,quad, With Preservative 05/31/2017, 05/06/2018   Influenza-Unspecified 05/23/2012, 04/28/2014, 04/14/2016, 06/06/2017, 06/07/2018   Moderna Sars-Covid-2 Vaccination 10/02/2019, 10/30/2019   PPD Test 04/28/2014   Td 10/05/1995   Tdap 01/16/2010     Objective: Vital Signs: BP 117/84 (BP Location: Left Arm, Patient Position: Sitting, Cuff Size: Normal)    Pulse 94    Ht 5\' 3"  (1.6 m)    Wt 147 lb 12.8 oz (67 kg)    BMI 26.18 kg/m    Physical Exam Vitals and nursing note reviewed.  Constitutional:      Appearance: She is well-developed.  HENT:     Head: Normocephalic and atraumatic.  Eyes:     Conjunctiva/sclera: Conjunctivae normal.  Cardiovascular:     Rate and Rhythm: Normal rate and regular  rhythm.     Heart sounds: Normal heart sounds.  Pulmonary:     Effort: Pulmonary effort is normal.     Breath sounds: Normal breath sounds.  Abdominal:     General: Bowel sounds are normal.     Palpations: Abdomen is soft.  Musculoskeletal:     Cervical back: Normal range of motion.  Lymphadenopathy:     Cervical: No cervical adenopathy.  Skin:    General: Skin is warm and dry.     Capillary Refill: Capillary refill takes less than 2 seconds.  Neurological:     Mental Status: She is alert and oriented to person, place, and time.  Psychiatric:        Behavior: Behavior normal.     Musculoskeletal Exam: C-spine, thoracic spine, lumbar spine have good range of motion with no discomfort.  Shoulder joints, elbow joints, wrist joints, MCPs, PIPs, DIPs have good range of motion with no synovitis.  Complete fist formation bilaterally.  Hip joints  have good range of motion with no groin pain.  Tenderness over bilateral trochanteric bursa.  Knee joints have good range of motion with no warmth or effusion.  Ankle joints have good range of motion with no tenderness or joint swelling.  CDAI Exam: CDAI Score: -- Patient Global: --; Provider Global: -- Swollen: --; Tender: -- Joint Exam 08/08/2021   No joint exam has been documented for this visit   There is currently no information documented on the homunculus. Go to the Rheumatology activity and complete the homunculus joint exam.  Investigation: No additional findings.  Imaging: No results found.  Recent Labs: Lab Results  Component Value Date   WBC 7.7 02/25/2020   HGB 15.1 (H) 02/25/2020   PLT 231 02/25/2020   NA 141 02/25/2020   K 3.8 02/25/2020   CL 108 02/25/2020   CO2 24 02/25/2020   GLUCOSE 101 (H) 02/25/2020   BUN 15 02/25/2020   CREATININE 1.03 (H) 02/25/2020   BILITOT 0.7 02/25/2020   ALKPHOS 62 02/25/2020   AST 17 02/25/2020   ALT 12 02/25/2020   PROT 7.2 02/25/2020   ALBUMIN 4.8 02/25/2020   CALCIUM 9.1  02/25/2020   GFRAA >60 02/25/2020    Speciality Comments: No specialty comments available.  Procedures:  No procedures performed Allergies: Patient has no known allergies.   Assessment / Plan:     Visit Diagnoses: Fibromyalgia: She experiences intermittent myalgias and muscle tenderness due to fibromyalgia.  She has ongoing discomfort due to trochanteric bursitis of both hips.  Discussed the importance of performing stretching exercises on a daily basis.  She was given a handout of these exercises to perform and was encouraged to use a foam roller.  Her biggest concern has been her level of fatigue.  She has been sleeping well at night but does not feel restored when she wakes up.  Lab work will be updated today to rule out an underlying cause for her increased fatigue.  Discussed the importance of regular exercise and good sleep hygiene.  She will follow-up in the office in 3 months.  Primary insomnia: She has been sleeping well at night.  She continues to experience fatigue on a daily basis.  Discussed the importance of good sleep hygiene and regular exercise.  Other fatigue -She presents today with ongoing fatigue on a daily basis.  She has been sleeping well at night but does not wake up feeling restored.  Discussed that her fatigue is likely secondary to fibromyalgia.  AVISE results were discussed today in the office.  Negative index.  No other clinical features of autoimmune disease at this time.  The following lab work was updated today for further evaluation.  Plan: VITAMIN D 25 Hydroxy (Vit-D Deficiency, Fractures), TSH, Vitamin B12, Iron, TIBC and Ferritin Panel, Magnesium, BASIC METABOLIC PANEL WITH GFR, CBC with Differential/Platelet  Lateral epicondylitis of both elbows: She has some tenderness over the lateral epicondyle of both elbows.  Discussed the importance of performing regular exercises.   DDD (degenerative disc disease), lumbar: She is not experiencing any increased  discomfort in her lower back at this time.  No symptoms of radiculopathy at this time.  Trochanteric bursitis of both hips: She has tenderness palpation over bilateral trochanteric bursa.  She was given a handout of stretching exercises to perform.  She was advised to notify us if her symptoms persist or worsen.  Raynaud's phenomenon without gangrene: She continues to experience intermittent symptoms of Raynaud's.  No digital ulcerations, signs of gangrene,  or signs of sclerodactyly were noted.  Good capillary refill less than 2 seconds noted.  No telangiectasias noted.  Discussed the diagnosis of Raynaud's in detail.  AVISE panel results was reviewed with the patient today: Negative index.  SCL 70, anti-RNA polymerase III, and anticentromere antibodies negative.  Discussed that her symptoms of Raynaud's do not seem to be due to an underlying autoimmune disease etiology.  She does not have any personal or family history of autoimmune disease. She is not exhibiting any other signs or symptoms of autoimmune disease at this time. Different treatment options were discussed today in detail.  She is not a good candidate for calcium channel blocker at this time due to her blood pressure running on the lower side of normal.  Discussed the importance of avoiding triggers including exposure to cold temperatures, extreme emotional stress, and tobacco smoke. Encouraged to wear gloves, socks, and keep her core body temperature warm.  Discussed symptoms to monitor for.  She was advised to notify us if she develops any new or worsening symptoms.  Vitamin D deficiency -She has a history of vitamin D deficiency.  Requested a vitamin D level checked today.  She continues to have persistent fatigue on a daily basis.  Plan: VITAMIN D 25 Hydroxy (Vit-D Deficiency, Fractures)  Palpitations: She continues to experience intermittent palpitations.  She has Appointment with cardiology later this month.  In the past she had to  wear a heart monitor for further evaluation.  Discussed that with her new onset symptoms of Raynaud's she is not a good candidate for beta-blockers which could exacerbate her symptoms.  She will further discuss with her cardiologist if she requires medication management in the future.  Orders: Orders Placed This Encounter  Procedures   VITAMIN D 25 Hydroxy (Vit-D Deficiency, Fractures)   TSH   Vitamin B12   Iron, TIBC and Ferritin Panel   Magnesium   BASIC METABOLIC PANEL WITH GFR   CBC with Differential/Platelet   No orders of the defined types were placed in this encounter.    Follow-Up Instructions: Return in about 3 months (around 11/06/2021).   Ofilia Neas, PA-C  Note - This record has been created using Dragon software.  Chart creation errors have been sought, but may not always  have been located. Such creation errors do not reflect on  the standard of medical care.

## 2021-08-08 ENCOUNTER — Ambulatory Visit: Payer: BC Managed Care – PPO | Admitting: Physician Assistant

## 2021-08-08 ENCOUNTER — Encounter: Payer: Self-pay | Admitting: Physician Assistant

## 2021-08-08 ENCOUNTER — Other Ambulatory Visit: Payer: Self-pay

## 2021-08-08 VITALS — BP 117/84 | HR 94 | Ht 63.0 in | Wt 147.8 lb

## 2021-08-08 DIAGNOSIS — R5383 Other fatigue: Secondary | ICD-10-CM | POA: Diagnosis not present

## 2021-08-08 DIAGNOSIS — M5136 Other intervertebral disc degeneration, lumbar region: Secondary | ICD-10-CM

## 2021-08-08 DIAGNOSIS — M797 Fibromyalgia: Secondary | ICD-10-CM | POA: Diagnosis not present

## 2021-08-08 DIAGNOSIS — M7712 Lateral epicondylitis, left elbow: Secondary | ICD-10-CM

## 2021-08-08 DIAGNOSIS — I7300 Raynaud's syndrome without gangrene: Secondary | ICD-10-CM

## 2021-08-08 DIAGNOSIS — F5101 Primary insomnia: Secondary | ICD-10-CM | POA: Diagnosis not present

## 2021-08-08 DIAGNOSIS — M7711 Lateral epicondylitis, right elbow: Secondary | ICD-10-CM

## 2021-08-08 DIAGNOSIS — R002 Palpitations: Secondary | ICD-10-CM

## 2021-08-08 DIAGNOSIS — M7062 Trochanteric bursitis, left hip: Secondary | ICD-10-CM

## 2021-08-08 DIAGNOSIS — M7061 Trochanteric bursitis, right hip: Secondary | ICD-10-CM

## 2021-08-08 DIAGNOSIS — E559 Vitamin D deficiency, unspecified: Secondary | ICD-10-CM

## 2021-08-08 NOTE — Patient Instructions (Signed)

## 2021-08-09 ENCOUNTER — Ambulatory Visit
Admission: RE | Admit: 2021-08-09 | Discharge: 2021-08-09 | Disposition: A | Discharge status: Home / Self Care | Payer: BC Managed Care – PPO | Source: Ambulatory Visit | Attending: Obstetrics and Gynecology | Admitting: Obstetrics and Gynecology

## 2021-08-09 DIAGNOSIS — N63 Unspecified lump in unspecified breast: Secondary | ICD-10-CM

## 2021-08-09 LAB — BASIC METABOLIC PANEL WITH GFR
BUN: 14 mg/dL (ref 7–25)
CO2: 28 mmol/L (ref 20–32)
Calcium: 10.1 mg/dL (ref 8.6–10.2)
Chloride: 106 mmol/L (ref 98–110)
Creat: 0.96 mg/dL (ref 0.50–0.99)
Glucose, Bld: 96 mg/dL (ref 65–99)
Potassium: 4 mmol/L (ref 3.5–5.3)
Sodium: 141 mmol/L (ref 135–146)
eGFR: 77 mL/min/{1.73_m2} (ref >=60)

## 2021-08-09 LAB — VITAMIN D 25 HYDROXY (VIT D DEFICIENCY, FRACTURES): Vit D, 25-Hydroxy: 26 ng/mL — ABNORMAL LOW (ref 30–100)

## 2021-08-09 LAB — IRON,TIBC AND FERRITIN PANEL
%SAT: 30 % (calc) (ref 16–45)
Ferritin: 21 ng/mL (ref 16–154)
Iron: 98 ug/dL (ref 40–190)
TIBC: 326 mcg/dL (calc) (ref 250–450)

## 2021-08-09 LAB — CBC WITH DIFFERENTIAL/PLATELET
Absolute Monocytes: 308 cells/uL (ref 200–950)
Basophils Absolute: 103 cells/uL (ref 0–200)
Basophils Relative: 1.8 %
Eosinophils Absolute: 148 cells/uL (ref 15–500)
Eosinophils Relative: 2.6 %
HCT: 46.7 % — ABNORMAL HIGH (ref 35.0–45.0)
Hemoglobin: 15.6 g/dL — ABNORMAL HIGH (ref 11.7–15.5)
Lymphs Abs: 1778 cells/uL (ref 850–3900)
MCH: 33.1 pg — ABNORMAL HIGH (ref 27.0–33.0)
MCHC: 33.4 g/dL (ref 32.0–36.0)
MCV: 99.2 fL (ref 80.0–100.0)
MPV: 9.8 fL (ref 7.5–12.5)
Monocytes Relative: 5.4 %
Neutro Abs: 3363 cells/uL (ref 1500–7800)
Neutrophils Relative %: 59 %
Platelets: 257 10*3/uL (ref 140–400)
RBC: 4.71 10*6/uL (ref 3.80–5.10)
RDW: 11.8 % (ref 11.0–15.0)
Total Lymphocyte: 31.2 %
WBC: 5.7 10*3/uL (ref 3.8–10.8)

## 2021-08-09 LAB — TSH: TSH: 1.77 mIU/L

## 2021-08-09 LAB — MAGNESIUM: Magnesium: 2.4 mg/dL (ref 1.5–2.5)

## 2021-08-09 NOTE — Progress Notes (Signed)
Vitamin D is low-26.  Please notify the patient and send in vitamin D 50,000 units once weekly x3 months. Recheck in 3 months.   BMP WNL.   Iron panel WNL.   TSH Wnl.  Magnesium WNL.   Hgb and hct are slightly elevated. Rest of CBC WNL.

## 2021-08-11 ENCOUNTER — Other Ambulatory Visit: Payer: Self-pay | Admitting: *Deleted

## 2021-08-11 DIAGNOSIS — E559 Vitamin D deficiency, unspecified: Secondary | ICD-10-CM

## 2021-08-11 MED ORDER — VITAMIN D (ERGOCALCIFEROL) 1.25 MG (50000 UNIT) PO CAPS: 50000.0000 [IU] | ORAL_CAPSULE | ORAL | 0 refills | Status: DC

## 2021-08-11 NOTE — Telephone Encounter (Signed)
-----   Message from Gearldine Bienenstock, PA-C sent at 08/09/2021  8:13 AM EST ----- Vitamin D is low-26.  Please notify the patient and send in vitamin D 50,000 units once weekly x3 months. Recheck in 3 months.   BMP WNL.   Iron panel WNL.   TSH Wnl.  Magnesium WNL.   Hgb and hct are slightly elevated. Rest of CBC WNL.

## 2021-10-01 ENCOUNTER — Telehealth: Payer: BC Managed Care – PPO | Admitting: Emergency Medicine

## 2021-10-01 ENCOUNTER — Encounter: Payer: Self-pay | Admitting: Emergency Medicine

## 2021-10-01 DIAGNOSIS — J019 Acute sinusitis, unspecified: Secondary | ICD-10-CM | POA: Diagnosis not present

## 2021-10-01 MED ORDER — PREDNISONE 10 MG (21) PO TBPK: ORAL_TABLET | Freq: Every day | ORAL | 0 refills | Status: DC

## 2021-10-01 MED ORDER — AMOXICILLIN-POT CLAVULANATE 875-125 MG PO TABS: 1.0000 | ORAL_TABLET | Freq: Two times a day (BID) | ORAL | 0 refills | Status: AC

## 2021-10-01 NOTE — Progress Notes (Signed)
I have spent 5 minutes in review of e-visit questionnaire, review and updating patient chart, medical decision making and response to patient.   Arianne Klinge, PA-C    

## 2021-10-01 NOTE — Progress Notes (Signed)
E-Visit for Sinus Problems  We are sorry that you are not feeling well.  Here is how we plan to help!  Based on what you have shared with me it looks like you have sinusitis.  Sinusitis is inflammation and infection in the sinus cavities of the head.  Based on your presentation I believe you most likely have Acute Bacterial Sinusitis.  This is an infection caused by bacteria and is treated with antibiotics. I have prescribed Augmentin 875mg /125mg  one tablet twice daily with food, for 7 days. You may use an oral decongestant such as Mucinex D or if you have glaucoma or high blood pressure use plain Mucinex. Saline nasal spray help and can safely be used as often as needed for congestion.  If you develop worsening sinus pain, fever or notice severe headache and vision changes, or if symptoms are not better after completion of antibiotic, please schedule an appointment with a health care provider.    Per patient request, prednisone taper also prescribed.  Take as directed and to completion.    Sinus infections are not as easily transmitted as other respiratory infection, however we still recommend that you avoid close contact with loved ones, especially the very young and elderly.  Remember to wash your hands thoroughly throughout the day as this is the number one way to prevent the spread of infection!  Home Care: Only take medications as instructed by your medical team. Complete the entire course of an antibiotic. Do not take these medications with alcohol. A steam or ultrasonic humidifier can help congestion.  You can place a towel over your head and breathe in the steam from hot water coming from a faucet. Avoid close contacts especially the very young and the elderly. Cover your mouth when you cough or sneeze. Always remember to wash your hands.  Get Help Right Away If: You develop worsening fever or sinus pain. You develop a severe head ache or visual changes. Your symptoms persist after you  have completed your treatment plan.  Make sure you Understand these instructions. Will watch your condition. Will get help right away if you are not doing well or get worse.  Thank you for choosing an e-visit.  Your e-visit answers were reviewed by a board certified advanced clinical practitioner to complete your personal care plan. Depending upon the condition, your plan could have included both over the counter or prescription medications.  Please review your pharmacy choice. Make sure the pharmacy is open so you can pick up prescription now. If there is a problem, you may contact your provider through [employer] and have the prescription routed to another pharmacy.  Your safety is important to [place]. If you have drug allergies check your prescription carefully.   For the next 24 hours you can use MyChart to ask questions about today's visit, request a non-urgent call back, or ask for a work or school excuse. You will get an email in the next two days asking about your experience. I hope that your e-visit has been valuable and will speed your recovery.

## 2021-10-10 ENCOUNTER — Telehealth: Payer: BC Managed Care – PPO | Admitting: Physician Assistant

## 2021-10-10 DIAGNOSIS — B379 Candidiasis, unspecified: Secondary | ICD-10-CM | POA: Diagnosis not present

## 2021-10-10 DIAGNOSIS — T3695XA Adverse effect of unspecified systemic antibiotic, initial encounter: Secondary | ICD-10-CM | POA: Diagnosis not present

## 2021-10-10 DIAGNOSIS — R3989 Other symptoms and signs involving the genitourinary system: Secondary | ICD-10-CM | POA: Diagnosis not present

## 2021-10-10 MED ORDER — FLUCONAZOLE 150 MG PO TABS: 150.0000 mg | ORAL_TABLET | Freq: Once | ORAL | 0 refills | Status: AC

## 2021-10-10 MED ORDER — SULFAMETHOXAZOLE-TRIMETHOPRIM 800-160 MG PO TABS: 1.0000 | ORAL_TABLET | Freq: Two times a day (BID) | ORAL | 0 refills | Status: DC

## 2021-10-10 NOTE — Progress Notes (Signed)
E-Visit for Urinary Problems ? ?We are sorry that you are not feeling well.  Here is how we plan to help! ? ?Based on what you shared with me it looks like you most likely have a simple urinary tract infection. ? ?A UTI (Urinary Tract Infection) is a bacterial infection of the bladder. ? ?Most cases of urinary tract infections are simple to treat but a key part of your care is to encourage you to drink plenty of fluids and watch your symptoms carefully. ? ?I have prescribed Bactrim DS One tablet twice a day for 5 days.  Your symptoms should gradually improve. Call us if the burning in your urine worsens, you develop worsening fever, back pain or pelvic pain or if your symptoms do not resolve after completing the antibiotic. ? ?I will also prescribe Diflucan for possible yeast infection from the antibiotics. ? ?Urinary tract infections can be prevented by drinking plenty of water to keep your body hydrated.  Also be sure when you wipe, wipe from front to back and don't hold it in!  If possible, empty your bladder every 4 hours. ? ?HOME CARE ?Drink plenty of fluids ?Compete the full course of the antibiotics even if the symptoms resolve ?Remember, when you need to go?go. Holding in your urine can increase the likelihood of getting a UTI! ?GET HELP RIGHT AWAY IF: ?You cannot urinate ?You get a high fever ?Worsening back pain occurs ?You see blood in your urine ?You feel sick to your stomach or throw up ?You feel like you are going to pass out ? ?MAKE SURE YOU  ?Understand these instructions. ?Will watch your condition. ?Will get help right away if you are not doing well or get worse. ? ? ?Thank you for choosing an e-visit. ? ?Your e-visit answers were reviewed by a board certified advanced clinical practitioner to complete your personal care plan. Depending upon the condition, your plan could have included both over the counter or prescription medications. ? ?Please review your pharmacy choice. Make sure the pharmacy  is open so you can pick up prescription now. If there is a problem, you may contact your provider through [employer] and have the prescription routed to another pharmacy.  Your safety is important to [place]. If you have drug allergies check your prescription carefully.  ? ?For the next 24 hours you can use MyChart to ask questions about today's visit, request a non-urgent call back, or ask for a work or school excuse. ?You will get an email in the next two days asking about your experience. I hope that your e-visit has been valuable and will speed your recovery. ? ?I provided 5 minutes of non face-to-face time during this encounter for chart review and documentation.  ? ?

## 2021-10-28 ENCOUNTER — Telehealth: Payer: BC Managed Care – PPO | Admitting: Nurse Practitioner

## 2021-10-28 DIAGNOSIS — H1033 Unspecified acute conjunctivitis, bilateral: Secondary | ICD-10-CM

## 2021-10-28 MED ORDER — POLYMYXIN B-TRIMETHOPRIM 10000-0.1 UNIT/ML-% OP SOLN: 2.0000 [drp] | OPHTHALMIC | 0 refills | Status: DC

## 2021-10-28 NOTE — Progress Notes (Signed)

## 2021-10-31 ENCOUNTER — Telehealth: Payer: BC Managed Care – PPO | Admitting: Family Medicine

## 2021-10-31 ENCOUNTER — Encounter: Payer: Self-pay | Admitting: Family Medicine

## 2021-10-31 ENCOUNTER — Encounter: Payer: Self-pay | Admitting: Family

## 2021-10-31 DIAGNOSIS — H10023 Other mucopurulent conjunctivitis, bilateral: Secondary | ICD-10-CM

## 2021-10-31 NOTE — Progress Notes (Deleted)
? ?Office Visit Note ? ?Patient: Alejandra Cook             ?Date of Birth: 18-Feb-1981           ?MRN: [id number]             ?PCP: Allegra Grana, FNP ?Referring: Allegra Grana, FNP ?Visit Date: 11/14/2021 ?Occupation: @GUAROCC @ ? ?Subjective:  ?No chief complaint on file. ? ? ?History of Present Illness: Alejandra Cook is a 41 y.o. female ***  ? ?Activities of Daily Living:  ?Patient reports morning stiffness for *** {minute/hour:19697}.   ?Patient {ACTIONS;DENIES/REPORTS:21021675::"Denies"} nocturnal pain.  ?Difficulty dressing/grooming: {ACTIONS;DENIES/REPORTS:21021675::"Denies"} ?Difficulty climbing stairs: {ACTIONS;DENIES/REPORTS:21021675::"Denies"} ?Difficulty getting out of chair: {ACTIONS;DENIES/REPORTS:21021675::"Denies"} ?Difficulty using hands for taps, buttons, cutlery, and/or writing: {ACTIONS;DENIES/REPORTS:21021675::"Denies"} ? ?No Rheumatology ROS completed.  ? ?PMFS History:  ?Patient Active Problem List  ? Diagnosis Date Noted  ? Shingles 02/27/2021  ? Sinus tachycardia 07/21/2020  ? Heart palpitations 06/26/2020  ? Otitis media 06/02/2020  ? Low back pain 02/23/2020  ? Amenorrhea 01/25/2020  ? Depression, major, single episode, mild (HCC) 11/23/2019  ? Irritable bowel syndrome 09/10/2019  ? Intractable migraine with aura without status migrainosus 11/07/2018  ? Contusion of left hand 09/09/2018  ? Polycythemia 12/01/2017  ? History of migraine 12/20/2016  ? Other fatigue 12/20/2016  ? Primary insomnia 12/20/2016  ? History of IBS 12/20/2016  ? History of renal calculi 12/20/2016  ? History of sinusitis 12/20/2016  ? Bug bites 01/23/2016  ? Bronchitis 08/11/2014  ? Routine general medical examination at a health care facility 01/21/2013  ? Fibromyalgia 01/17/2012  ? Allergic rhinitis 01/17/2012  ? Migraine headache 05/12/2007  ? DDD (degenerative disc disease), lumbosacral 05/12/2007  ?  ?Past Medical History:  ?Diagnosis Date  ? Allergy   ? Dr. 07/12/2007, gold and nickel allergy  ?  Anxiety   ? Bulging discs   ? Degenerative disc disease, lumbar   ? Depression   ? off of zoloft for 1 year, had problems with depression and anxiety after last child  ? Fibromyalgia   ? after pregnancy, Followed by Gwen Pounds  ? Fibromyalgia   ? Headache(784.0)   ? Dr. Rozanna [facility] at [facility]; migraines since elementary   ? IBS (irritable bowel syndrome)   ? Intrahepatic cholestasis of pregnancy in third trimester, antepartum   ? Spontaneous abortion in second trimester Dec 2014  ?  ?Family History  ?Problem Relation Age of Onset  ? Healthy Mother   ? Hypertension Father   ? Cancer Maternal Grandmother   ?     cancer, breast  ? Cancer Paternal Grandmother   ?     breast  ? Healthy Sister   ? Hypertension Paternal Grandfather   ? Healthy Daughter   ? Healthy Son   ? ?Past Surgical History:  ?Procedure Laterality Date  ? COLONOSCOPY    ? normal  ? COLPOSCOPY  2002  ? CYSTOSCOPY    ? ureteral stent inserted  ? DILATION AND EVACUATION N/A 08/07/2013  ? Procedure: DILATATION AND EVACUATION;  Surgeon: 10/05/2013, MD;  Location: [facility];  Service: Gynecology;  Laterality: N/A;  ? LAPAROSCOPY N/A 03/20/2013  ? Procedure: LAPAROSCOPY OPERATIVE  IUD REMOVAL;  Surgeon: 03/22/2013, MD;  Location: [facility];  Service: Gynecology;  Laterality: N/A;  Intraabdominal from [place]  ? NASAL SINUS SURGERY    ? PILONIDAL CYST EXCISION    ? VAGINAL DELIVERY  2011  ? ?Social History  ? ?Social History Narrative  ?  Lives in [place]. 10YO daughter. 5 YO son.  ?   ? Works- 10th and 11th; [employer]  ?   ? ?Immunization History  ?Administered Date(s) Administered  ? Hepatitis B 09/20/1997, 10/18/1997, 03/22/1998  ? Hepatitis B, adult 09/20/1997, 10/18/1997, 03/22/1998, 04/30/2014, 06/01/2014, 11/02/2014  ? Influenza Nasal 05/07/2019  ? Influenza Split 08/06/2013  ? Influenza,inj,Quad PF,6+ Mos 04/28/2014, 06/22/2019  ? Influenza,inj,quad, With Preservative 05/31/2017, 05/06/2018  ? Influenza-Unspecified 05/23/2012, 04/28/2014,  04/14/2016, 06/06/2017, 06/07/2018  ? Moderna Sars-Covid-2 Vaccination 10/02/2019, 10/30/2019  ? PPD Test 04/28/2014  ? Td 10/05/1995  ? Tdap 01/16/2010  ?  ? ?Objective: ?Vital Signs: There were no vitals taken for this visit.  ? ?Physical Exam  ? ?Musculoskeletal Exam: *** ? ?CDAI Exam: ?CDAI Score: -- ?Patient Global: --; Provider Global: -- ?Swollen: --; Tender: -- ?Joint Exam 11/14/2021  ? ?No joint exam has been documented for this visit  ? ?There is currently no information documented on the homunculus. Go to the Rheumatology activity and complete the homunculus joint exam. ? ?Investigation: ?No additional findings. ? ?Imaging: ?No results found. ? ?Recent Labs: ?Lab Results  ?Component Value Date  ? WBC 5.7 08/08/2021  ? HGB 15.6 (H) 08/08/2021  ? PLT 257 08/08/2021  ? NA 141 08/08/2021  ? K 4.0 08/08/2021  ? CL 106 08/08/2021  ? CO2 28 08/08/2021  ? GLUCOSE 96 08/08/2021  ? BUN 14 08/08/2021  ? CREATININE 0.96 08/08/2021  ? BILITOT 0.7 02/25/2020  ? ALKPHOS 62 02/25/2020  ? AST 17 02/25/2020  ? ALT 12 02/25/2020  ? PROT 7.2 02/25/2020  ? ALBUMIN 4.8 02/25/2020  ? CALCIUM 10.1 08/08/2021  ? GFRAA >60 02/25/2020  ? ? ?Speciality Comments: No specialty comments available. ? ?Procedures:  ?No procedures performed ?Allergies: Patient has no known allergies.  ? ?Assessment / Plan:     ?Visit Diagnoses: No diagnosis found. ? ?Orders: ?No orders of the defined types were placed in this encounter. ? ?No orders of the defined types were placed in this encounter. ? ? ?Face-to-face time spent with patient was *** minutes. Greater than 50% of time was spent in counseling and coordination of care. ? ?Follow-Up Instructions: No follow-ups on file. ? ? ?Ellen Henri, CMA ? ?Note - This record has been created using [employer].  ?Chart creation errors have been sought, but may not always  ?have been located. Such creation errors do not reflect on  ?the standard of medical care.  ?

## 2021-10-31 NOTE — Progress Notes (Signed)
[place]  ? ?Had question about returning to work, no note is needed. ? ?Patient acknowledged agreement and understanding of the plan.  ? ?

## 2021-11-01 NOTE — Telephone Encounter (Signed)
LMTCB office to schedule appoint ?

## 2021-11-02 ENCOUNTER — Ambulatory Visit: Payer: BC Managed Care – PPO | Admitting: Internal Medicine

## 2021-11-02 ENCOUNTER — Encounter: Payer: Self-pay | Admitting: Internal Medicine

## 2021-11-02 DIAGNOSIS — H109 Unspecified conjunctivitis: Secondary | ICD-10-CM | POA: Insufficient documentation

## 2021-11-02 DIAGNOSIS — H1033 Unspecified acute conjunctivitis, bilateral: Secondary | ICD-10-CM | POA: Diagnosis not present

## 2021-11-02 MED ORDER — AMOXICILLIN-POT CLAVULANATE 875-125 MG PO TABS: 1.0000 | ORAL_TABLET | Freq: Two times a day (BID) | ORAL | 0 refills | Status: DC

## 2021-11-02 MED ORDER — AMOXICILLIN 875 MG PO TABS: 875.0000 mg | ORAL_TABLET | Freq: Two times a day (BID) | ORAL | 0 refills | Status: DC

## 2021-11-02 MED ORDER — CIPROFLOXACIN HCL 0.3 % OP SOLN: OPHTHALMIC | 0 refills | Status: DC

## 2021-11-02 NOTE — Progress Notes (Signed)
Patient ID: Alejandra Cook, female   DOB: Nov 08, 1980, 41 y.o.   MRN: [id number] ? ? ?Subjective:  ? ? Patient ID: Alejandra Cook, female    DOB: Nov 08, 1980, 41 y.o.   MRN: [id number] ? ?This visit occurred during the SARS-CoV-2 public health emergency.  Safety protocols were in place, including screening questions prior to the visit, additional usage of staff PPE, and extensive cleaning of exam room while observing appropriate contact time as indicated for disinfecting solutions.  ? ?Patient here for work in appt ?Chief Complaint  ?Patient presents with  ? Conjunctivitis  ? .  ? ?HPI ?Work in - persistent eye issues.  Was seen - E visit 10/28/21 for bilateral red eyes, itching and drainage.  Son had pink eye.  Prescribed polytrim. Reports over this week, she has noticed progression.  Now involves both eyes.  Both eyes are swelling.  Pain and redness beneath eyes.  Reports eye drainage and crusting. No vision loss.  Burning sensation beneath eyes and in eyes.  No fever.  No sinus congestion.  No sore throat.  No chest congestion or cough.  No concern regarding pregnancy.  ? ? ?Past Medical History:  ?Diagnosis Date  ? Allergy   ? Dr. Gwen PoundsKowalski, gold and nickel allergy  ? Anxiety   ? Bulging discs   ? Degenerative disc disease, lumbar   ? Depression   ? off of zoloft for 1 year, had problems with depression and anxiety after last child  ? Fibromyalgia   ? after pregnancy, Followed by Rozanna Boxevoshar  ? Fibromyalgia   ? Headache(784.0)   ? [facility]. 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 IBS (irritable bowel syndrome)   ? Intrahepatic cholestasis of pregnancy in third trimester, antepartum   ? Spontaneous abortion in second trimester Dec 2014  ? ?Past Surgical History:  ?Procedure Laterality Date  ? COLONOSCOPY    ? normal  ? COLPOSCOPY  2002  ? CYSTOSCOPY    ? ureteral stent inserted  ? DILATION AND EVACUATION N/A 08/07/2013  ? Procedure: DILATATION AND EVACUATION;  Surgeon: Zelphia CairoGretchen Adkins, MD;   Location: [facility];  Service: Gynecology;  Laterality: N/A;  ? LAPAROSCOPY N/A 03/20/2013  ? Procedure: LAPAROSCOPY OPERATIVE  IUD REMOVAL;  Surgeon: Zelphia CairoGretchen Adkins, MD;  Location: [facility];  Service: Gynecology;  Laterality: N/A;  Intraabdominal from [place]  ? NASAL SINUS SURGERY    ? PILONIDAL CYST EXCISION    ? VAGINAL DELIVERY  2011  ? ?Family History  ?Problem Relation Age of Onset  ? Healthy Mother   ? Hypertension Father   ? Cancer Maternal Grandmother   ?     cancer, breast  ? Cancer Paternal Grandmother   ?     breast  ? Healthy Sister   ? Hypertension Paternal Grandfather   ? Healthy Daughter   ? Healthy Son   ? ?Social History  ? ?Socioeconomic History  ? Marital status: Married  ?  Spouse name: Not on file  ? Number of children: Not on file  ? Years of education: Not on file  ? Highest education level: Not on file  ?Occupational History  ? Not on file  ?Tobacco Use  ? Smoking status: Never  ? Smokeless tobacco: Never  ?Vaping Use  ? Vaping Use: Never used  ?Substance and Sexual Activity  ? Alcohol use: No  ?  Alcohol/week: 0.0 standard drinks  ? Drug use: Never  ? Sexual activity: Yes  ?Other Topics Concern  ?  Not on file  ?Social History Narrative  ? Lives in [place]. 10YO daughter. 5 YO son.  ?   ? Works- 10th and 11th; [employer]  ?   ? ?Social Determinants of Health  ? ?Financial Resource Strain: Not on file  ?Food Insecurity: Not on file  ?Transportation Needs: Not on file  ?Physical Activity: Not on file  ?Stress: Not on file  ?Social Connections: Not on file  ? ? ? ?Review of Systems  ?Constitutional:  Negative for appetite change, fatigue and unexpected weight change.  ?HENT:  Negative for congestion and sinus pressure.   ?Eyes:  Positive for discharge and redness.  ?     Light sensitivity.   ?Respiratory:  Negative for cough, chest tightness and shortness of breath.   ?Cardiovascular:  Negative for chest pain and palpitations.  ?Gastrointestinal:  Negative for abdominal pain, diarrhea, nausea  and vomiting.  ?Skin:  Negative for color change.  ?     Redness - peri oribital.    ?Neurological:   ?     No significant headache.   ?Psychiatric/Behavioral:  Negative for agitation and dysphoric mood.   ? ?   ?Objective:  ?  ? ?BP 110/70   Pulse 90   Temp 97.9 ?F (36.6 ?C)   Resp 16   Ht 5\' 3"  (1.6 m)   Wt 149 lb (67.6 kg)   SpO2 99%   BMI 26.39 kg/m?  ?Wt Readings from Last 3 Encounters:  ?11/02/21 149 lb (67.6 kg)  ?08/08/21 147 lb 12.8 oz (67 kg)  ?07/06/21 147 lb (66.7 kg)  ? ? ?Physical Exam ?Vitals reviewed.  ?Constitutional:   ?   General: She is not in acute distress. ?   Appearance: Normal appearance.  ?HENT:  ?   Head: Normocephalic and atraumatic.  ?   Right Ear: Ear canal and external ear normal.  ?   Left Ear: Tympanic membrane, ear canal and external ear normal.  ?   Nose: Nose normal.  ?Eyes:  ?   Extraocular Movements: Extraocular movements intact.  ?   Pupils: Pupils are equal, round, and reactive to light.  ?   Comments: Erythema.  ?Periorbital redness and swelling.   ?Neck:  ?   Thyroid: No thyromegaly.  ?Cardiovascular:  ?   Rate and Rhythm: Normal rate and regular rhythm.  ?Pulmonary:  ?   Effort: No respiratory distress.  ?   Breath sounds: Normal breath sounds. No wheezing.  ?Abdominal:  ?   Palpations: Abdomen is soft.  ?Musculoskeletal:     ?   General: No swelling.  ?   Cervical back: Neck supple. No tenderness.  ?Lymphadenopathy:  ?   Cervical: No cervical adenopathy.  ?Skin: ?   Findings: No rash.  ?Neurological:  ?   Mental Status: She is alert.  ?Psychiatric:     ?   Mood and Affect: Mood normal.     ?   Behavior: Behavior normal.  ? ? ? ?Outpatient Encounter Medications as of 11/02/2021  ?Medication Sig  ? amoxicillin-clavulanate (AUGMENTIN) 875-125 MG tablet Take 1 tablet by mouth 2 (two) times daily.  ? ciprofloxacin (CILOXAN) 0.3 % ophthalmic solution 1-2 drops qid for 7 days  ? [DISCONTINUED] amoxicillin (AMOXIL) 875 MG tablet Take 1 tablet (875 mg total) by mouth 2 (two)  times daily for 10 days.  ? buPROPion (WELLBUTRIN XL) 300 MG 24 hr tablet TAKE 1 TABLET BY MOUTH EVERY DAY IN THE MORNING  ? butalbital-acetaminophen-caffeine (FIORICET) 8607622020  MG tablet Take 1 tablet by mouth as needed.  ? cyclobenzaprine (FLEXERIL) 5 MG tablet Take 1 tablet (5 mg total) by mouth 3 (three) times daily as needed for muscle spasms.  ? EMGALITY 120 MG/ML SOAJ Inject into the skin.  ? fluticasone (FLONASE) 50 MCG/ACT nasal spray Place 1 spray into both nostrils daily.  ? gabapentin (NEURONTIN) 300 MG capsule Take 300 mg by mouth at bedtime.   ? ketorolac (TORADOL) 10 MG tablet Take 10 mg by mouth every 6 (six) hours as needed.  ? LO LOESTRIN FE 1 MG-10 MCG / 10 MCG tablet Take 1 tablet by mouth daily. as directed  ? ondansetron (ZOFRAN) 4 MG tablet TAKE 1 TABLET (4 MG TOTAL) BY MOUTH DAILY AS NEEDED FOR NAUSEA OR VOMITING.  ? predniSONE (STERAPRED UNI-PAK 21 TAB) 10 MG (21) TBPK tablet Take by mouth daily. Take 6 tabs by mouth daily  for 2 days, then 5 tabs for 2 days, then 4 tabs for 2 days, then 3 tabs for 2 days, 2 tabs for 2 days, then 1 tab by mouth daily for 2 days  ? promethazine (PHENERGAN) 12.5 MG tablet Take 1-2 tablets (12.5-25 mg total) by mouth once as needed for up to 1 dose for nausea or vomiting. With migraines.  ? rizatriptan (MAXALT) 10 MG tablet Take 1 tablet (10 mg total) by mouth as needed for migraine. May repeat in 2 hours if needed. Max dose 3 pills in 24 hours  ? sertraline (ZOLOFT) 100 MG tablet Take 100 mg by mouth daily.  ? sulfamethoxazole-trimethoprim (BACTRIM DS) 800-160 MG tablet Take 1 tablet by mouth 2 (two) times daily.  ? SUMAtriptan (IMITREX) 100 MG tablet Take by mouth as needed.  ? Vitamin D, Ergocalciferol, (DRISDOL) 1.25 MG (50000 UNIT) CAPS capsule Take 1 capsule (50,000 Units total) by mouth every 7 (seven) days.  ? zonisamide (ZONEGRAN) 100 MG capsule take 2 tabs at hs  ? [DISCONTINUED] trimethoprim-polymyxin b (POLYTRIM) ophthalmic solution Place 2 drops  into both eyes every 4 (four) hours.  ? ?No facility-administered encounter medications on file as of 11/02/2021.  ?  ? ?Lab Results  ?Component Value Date  ? WBC 5.7 08/08/2021  ? HGB 15.6 (H) 08/08/2021  ? HCT 46.7

## 2021-11-02 NOTE — Assessment & Plan Note (Addendum)
Exam appears c/w bacterial conjunctivitis.  Not responding to current drops.  Change to cipro ophthalmic drops.  Also, rx given for augmentin.  If no improvement and if increased soft tissue swelling and redness persists, take augmentin.  Call with update.  Probiotics as directed.  Will need to be reevaluated if any change or worsening symptoms.  ?

## 2021-11-03 NOTE — Telephone Encounter (Signed)
LMTCB to office to schedule appointment ?

## 2021-11-07 NOTE — Telephone Encounter (Signed)
Patient was seen in office on 11/02/21 by Dr. Lorin Picket..See notes in patient chart ?

## 2021-11-14 ENCOUNTER — Ambulatory Visit: Payer: BC Managed Care – PPO | Admitting: Rheumatology

## 2021-11-14 DIAGNOSIS — M7062 Trochanteric bursitis, left hip: Secondary | ICD-10-CM

## 2021-11-14 DIAGNOSIS — E559 Vitamin D deficiency, unspecified: Secondary | ICD-10-CM

## 2021-11-14 DIAGNOSIS — I7300 Raynaud's syndrome without gangrene: Secondary | ICD-10-CM

## 2021-11-14 DIAGNOSIS — R002 Palpitations: Secondary | ICD-10-CM

## 2021-11-14 DIAGNOSIS — R5383 Other fatigue: Secondary | ICD-10-CM

## 2021-11-14 DIAGNOSIS — M5136 Other intervertebral disc degeneration, lumbar region: Secondary | ICD-10-CM

## 2021-11-14 DIAGNOSIS — M797 Fibromyalgia: Secondary | ICD-10-CM

## 2021-11-14 DIAGNOSIS — F5101 Primary insomnia: Secondary | ICD-10-CM

## 2021-11-14 DIAGNOSIS — M7711 Lateral epicondylitis, right elbow: Secondary | ICD-10-CM

## 2021-12-29 ENCOUNTER — Ambulatory Visit
Admission: RE | Admit: 2021-12-29 | Discharge: 2021-12-29 | Disposition: A | Discharge status: Home / Self Care | Payer: BC Managed Care – PPO | Source: Ambulatory Visit | Attending: Internal Medicine | Admitting: Internal Medicine

## 2021-12-29 ENCOUNTER — Encounter: Payer: Self-pay | Admitting: Internal Medicine

## 2021-12-29 ENCOUNTER — Ambulatory Visit: Payer: BC Managed Care – PPO | Admitting: Internal Medicine

## 2021-12-29 ENCOUNTER — Telehealth: Payer: Self-pay

## 2021-12-29 VITALS — BP 128/80 | HR 97 | Temp 98.0°F | Ht 63.0 in | Wt 146.8 lb

## 2021-12-29 DIAGNOSIS — R1013 Epigastric pain: Secondary | ICD-10-CM

## 2021-12-29 DIAGNOSIS — K824 Cholesterolosis of gallbladder: Secondary | ICD-10-CM | POA: Diagnosis not present

## 2021-12-29 LAB — COMPREHENSIVE METABOLIC PANEL
ALT: 13 U/L (ref 0–35)
AST: 16 U/L (ref 0–37)
Albumin: 4.6 g/dL (ref 3.5–5.2)
Alkaline Phosphatase: 63 U/L (ref 39–117)
BUN: 20 mg/dL (ref 6–23)
CO2: 23 mEq/L (ref 19–32)
Calcium: 9 mg/dL (ref 8.4–10.5)
Chloride: 109 mEq/L (ref 96–112)
Creatinine, Ser: 1.09 mg/dL (ref 0.40–1.20)
GFR: 63.39 mL/min (ref >60.00)
Glucose, Bld: 81 mg/dL (ref 70–99)
Potassium: 4 mEq/L (ref 3.5–5.1)
Sodium: 141 mEq/L (ref 135–145)
Total Bilirubin: 0.6 mg/dL (ref 0.2–1.2)
Total Protein: 6.5 g/dL (ref 6.0–8.3)

## 2021-12-29 LAB — CBC WITH DIFFERENTIAL/PLATELET
Basophils Absolute: 0.1 10*3/uL (ref 0.0–0.1)
Basophils Relative: 1.3 % (ref 0.0–3.0)
Eosinophils Absolute: 0.1 10*3/uL (ref 0.0–0.7)
Eosinophils Relative: 1.5 % (ref 0.0–5.0)
HCT: 44 % (ref 36.0–46.0)
Hemoglobin: 14.9 g/dL (ref 12.0–15.0)
Lymphocytes Relative: 34.8 % (ref 12.0–46.0)
Lymphs Abs: 1.6 10*3/uL (ref 0.7–4.0)
MCHC: 33.9 g/dL (ref 30.0–36.0)
MCV: 97.7 fl (ref 78.0–100.0)
Monocytes Absolute: 0.3 10*3/uL (ref 0.1–1.0)
Monocytes Relative: 7.6 % (ref 3.0–12.0)
Neutro Abs: 2.5 10*3/uL (ref 1.4–7.7)
Neutrophils Relative %: 54.8 % (ref 43.0–77.0)
Platelets: 237 10*3/uL (ref 150.0–400.0)
RBC: 4.51 Mil/uL (ref 3.87–5.11)
RDW: 13.3 % (ref 11.5–15.5)
WBC: 4.6 10*3/uL (ref 4.0–10.5)

## 2021-12-29 LAB — LIPASE: Lipase: 25 U/L (ref 11.0–59.0)

## 2021-12-29 NOTE — Telephone Encounter (Signed)
Pt has seen results via mychart  12/29/2021 12:05 PM Lvm for pt to return call to see if interested in referral to surgery

## 2021-12-29 NOTE — Progress Notes (Signed)
Chief Complaint  Patient presents with   Follow-up    Gallbladder pain   F/u  1. ? GB issues epigastric pain no radiation sharp 8/10 cholestasis with son 6 years ago  tried phengerghan pain was worse after food 1 hour the other night weds FH GB disease  Review of Systems  Constitutional:  Negative for weight loss.  HENT:  Negative for hearing loss.   Eyes:  Negative for blurred vision.  Respiratory:  Negative for shortness of breath.   Cardiovascular:  Negative for chest pain.  Gastrointestinal:  Positive for abdominal pain and nausea. Negative for blood in stool.  Genitourinary:  Negative for dysuria.  Musculoskeletal:  Negative for falls and joint pain.  Skin:  Negative for rash.  Neurological:  Negative for headaches.  Psychiatric/Behavioral:  Negative for depression.   Past Medical History:  Diagnosis Date   Allergy    Dr. Gwen Pounds, gold and nickel allergy   Anxiety    Bulging discs    Degenerative disc disease, lumbar    Depression    off of zoloft for 1 year, had problems with depression and anxiety after last child   Fibromyalgia    after pregnancy, Followed by Devoshar   Fibromyalgia    Headache(784.0)    Dr. Neale Burly at [facility]; migraines since elementary    IBS (irritable bowel syndrome)    Intrahepatic cholestasis of pregnancy in third trimester, antepartum    Spontaneous abortion in second trimester Dec 2014   Past Surgical History:  Procedure Laterality Date   COLONOSCOPY     normal   COLPOSCOPY  2002   CYSTOSCOPY     ureteral stent inserted   DILATION AND EVACUATION N/A 08/07/2013   Procedure: DILATATION AND EVACUATION;  Surgeon: Zelphia Cairo, MD;  Location: [facility];  Service: Gynecology;  Laterality: N/A;   LAPAROSCOPY N/A 03/20/2013   Procedure: LAPAROSCOPY OPERATIVE  IUD REMOVAL;  Surgeon: Zelphia Cairo, MD;  Location: [facility];  Service: Gynecology;  Laterality: N/A;  Intraabdominal from [place]   NASAL SINUS SURGERY     PILONIDAL CYST  EXCISION     VAGINAL DELIVERY  2011   Family History  Problem Relation Age of Onset   Healthy Mother    Hypertension Father    Cancer Maternal Grandmother        cancer, breast   Cancer Paternal Grandmother        breast   Healthy Sister    Hypertension Paternal Actor    Healthy Daughter    Healthy Son    Social History   Socioeconomic History   Marital status: Married    Spouse name: Not on file   Number of children: Not on file   Years of education: Not on file   Highest education level: Not on file  Occupational History   Not on file  Tobacco Use   Smoking status: Never   Smokeless tobacco: Never  Vaping Use   Vaping Use: Never used  Substance and Sexual Activity   Alcohol use: No    Alcohol/week: 0.0 standard drinks   Drug use: Never   Sexual activity: Yes  Other Topics Concern   Not on file  Social History Narrative   Lives in [place]. 10YO daughter. 5 YO son.      Works- 10[employer] 11[employer]; [employer]      Social Determinants of Health   Financial Resource Strain: Not on file  Food Insecurity: Not on file  Transportation Needs: Not on  file  Physical Activity: Not on file  Stress: Not on file  Social Connections: Not on file  Intimate Partner Violence: Not on file   Current Meds  Medication Sig   buPROPion (WELLBUTRIN XL) 300 MG 24 hr tablet TAKE 1 TABLET BY MOUTH EVERY DAY IN THE MORNING   butalbital-acetaminophen-caffeine (FIORICET) 50-325-40 MG tablet Take 1 tablet by mouth as needed.   cyclobenzaprine (FLEXERIL) 5 MG tablet Take 1 tablet (5 mg total) by mouth 3 (three) times daily as needed for muscle spasms.   EMGALITY 120 MG/ML SOAJ Inject into the skin.   fluticasone (FLONASE) 50 MCG/ACT nasal spray Place 1 spray into both nostrils daily.   gabapentin (NEURONTIN) 300 MG capsule Take 300 mg by mouth at bedtime.    ketorolac (TORADOL) 10 MG tablet Take 10 mg by mouth every 6 (six) hours as needed.   LO LOESTRIN FE 1 MG-10 MCG / 10 MCG  tablet Take 1 tablet by mouth daily. as directed   ondansetron (ZOFRAN) 4 MG tablet TAKE 1 TABLET (4 MG TOTAL) BY MOUTH DAILY AS NEEDED FOR NAUSEA OR VOMITING.   promethazine (PHENERGAN) 12.5 MG tablet Take 1-2 tablets (12.5-25 mg total) by mouth once as needed for up to 1 dose for nausea or vomiting. With migraines.   rizatriptan (MAXALT) 10 MG tablet Take 1 tablet (10 mg total) by mouth as needed for migraine. May repeat in 2 hours if needed. Max dose 3 pills in 24 hours   sertraline (ZOLOFT) 100 MG tablet Take 100 mg by mouth daily.   SUMAtriptan (IMITREX) 100 MG tablet Take by mouth as needed.   Vitamin D, Ergocalciferol, (DRISDOL) 1.25 MG (50000 UNIT) CAPS capsule Take 1 capsule (50,000 Units total) by mouth every 7 (seven) days.   zonisamide (ZONEGRAN) 100 MG capsule take 2 tabs at hs   No Known Allergies No results found for this or any previous visit (from the past 2160 hour(s)). Objective  Body mass index is 26 kg/m. Wt Readings from Last 3 Encounters:  12/29/21 146 lb 12.8 oz (66.6 kg)  11/02/21 149 lb (67.6 kg)  08/08/21 147 lb 12.8 oz (67 kg)   Temp Readings from Last 3 Encounters:  12/29/21 98 F (36.7 C) (Temporal)  11/02/21 97.9 F (36.6 C)  07/09/21 98.3 F (36.8 C)   BP Readings from Last 3 Encounters:  12/29/21 128/80  11/02/21 110/70  08/08/21 117/84   Pulse Readings from Last 3 Encounters:  12/29/21 97  11/02/21 90  08/08/21 94    Physical Exam Vitals and nursing note reviewed.  Constitutional:      Appearance: Normal appearance. She is well-developed and well-groomed.  HENT:     Head: Normocephalic and atraumatic.  Eyes:     Conjunctiva/sclera: Conjunctivae normal.     Pupils: Pupils are equal, round, and reactive to light.  Cardiovascular:     Rate and Rhythm: Normal rate and regular rhythm.     Heart sounds: Normal heart sounds. No murmur heard. Pulmonary:     Effort: Pulmonary effort is normal.     Breath sounds: Normal breath sounds.   Abdominal:     General: Abdomen is flat. Bowel sounds are normal.     Tenderness: There is abdominal tenderness in the epigastric area.  Musculoskeletal:        General: No tenderness.  Skin:    General: Skin is warm and dry.  Neurological:     General: No focal deficit present.     Mental Status:  She is alert and oriented to person, place, and time. Mental status is at baseline.     Cranial Nerves: Cranial nerves 2-12 are intact.     Motor: Motor function is intact.     Coordination: Coordination is intact.     Gait: Gait is intact.  Psychiatric:        Attention and Perception: Attention and perception normal.        Mood and Affect: Mood and affect normal.        Speech: Speech normal.        Behavior: Behavior normal. Behavior is cooperative.        Thought Content: Thought content normal.        Cognition and Memory: Cognition and memory normal.        Judgment: Judgment normal.    Assessment  Plan  Epigastric pain - Plan: Comprehensive metabolic panel, CBC with Differential/Platelet, Lipase, Urinalysis, Routine w reflex microscopic, US Abdomen CoKoreamplete, H Pylori, IGM, IGG, IGA AB  Dyspepsia - Plan: H Pylori, IGM, IGG, IGA AB   Prn zofran,phenerghan  If neg US will do HIDA and refer to [facility]   Provider: Dr. French Anaracy McLean-Scocuz[facility]

## 2021-12-29 NOTE — Patient Instructions (Signed)
Gallbladder Eating Plan High blood cholesterol, obesity, a sedentary lifestyle, an unhealthy diet, and diabetes are risk factors for developing gallstones. If you have a gallbladder condition, you may have trouble digesting fats and tolerating high fat intake. Eating a low-fat diet can help reduce your symptoms and may be helpful before and after having surgery to remove your gallbladder (cholecystectomy). Your health care provider may recommend that you work with a dietitian to help you reduce the amount of fat in your diet. What are tips for following this plan? General guidelines Limit your fat intake to less than 30% of your total daily calories. If you eat around 1,800 calories each day, this means eating less than 60 grams (g) of fat per day. Fat is an important part of a healthy diet. Eating a low-fat diet can make it hard to maintain a healthy body weight. Ask your dietitian how much fat, calories, and other nutrients you need each day. Eat small, frequent meals throughout the day instead of three large meals. Drink at least 8-10 cups (1.9-2.4 L) of fluid a day. Drink enough fluid to keep your urine pale yellow. If you drink alcohol: Limit how much you have to: 0-1 drink a day for women who are not pregnant. 0-2 drinks a day for men. Know how much alcohol is in a drink. In the [place] one drink equals one 12 oz bottle of beer (355 mL), one 5 oz glass of wine (148 mL), or one 1 oz glass of hard liquor (44 mL). Reading food labels  Check nutrition facts on food labels for the amount of fat per serving. Choose foods with less than 3 grams of fat per serving. Shopping Choose nonfat and low-fat healthy foods. Look for the words "nonfat," "low-fat," or "fat-free." Avoid buying processed or prepackaged foods. Cooking Cook using low-fat methods, such as baking, broiling, grilling, or boiling. Cook with small amounts of healthy fats, such as olive oil, grapeseed oil, canola oil, avocado oil, or  sunflower oil. What foods are recommended?  All fresh, frozen, or canned fruits and vegetables. Whole grains. Low-fat or nonfat (skim) milk and yogurt. Lean meat, skinless poultry, fish, eggs, and beans. Low-fat protein supplement powders or drinks. Spices and herbs. The items listed above may not be a complete list of foods and beverages you can eat and drink. Contact a dietitian for more information. What foods are not recommended? High-fat foods. These include baked goods, fast food, fatty cuts of meat, ice cream, french toast, sweet rolls, pizza, cheese bread, foods covered with butter, creamy sauces, or cheese. Fried foods. These include french fries, tempura, battered fish, breaded chicken, fried breads, and sweets. Foods that cause bloating and gas. The items listed above may not be a complete list of foods that you should avoid. Contact a dietitian for more information. Summary A low-fat diet can be helpful if you have a gallbladder condition, or before and after gallbladder surgery. Limit your fat intake to less than 30% of your total daily calories. This is about 60 g of fat if you eat 1,800 calories each day. Eat small, frequent meals throughout the day instead of three large meals. This information is not intended to replace advice given to you by your health care provider. Make sure you discuss any questions you have with your health care provider. Document Revised: 07/07/2021 Document Reviewed: 07/07/2021 Elsevier Patient Education  2023 [employer]  Biliary Dyskinesia  Biliary dyskinesia is a condition in which the gallbladder or bile ducts  cannot release or move bile normally. Bile is a fluid that is made in the liver to help the body digest food. Bile flows to the gallbladder to be stored. When bile is needed for digestion, it leaves the gallbladder and flows through the bile ducts into the digestive tract. Biliary dyskinesia causes bile to build up, and that can cause  pain in the abdomen. This condition may also be called: Acalculous cholecystopathy. Functional gallbladder disorder. Sphincter of Oddi dysfunction. This is one type of biliary dyskinesia. What are the causes? The cause of biliary dyskinesia is poor function of the gallbladder. The exact reason this happens is often unknown. One reason may be changes in the gallbladder that are caused by obesity. What increases the risk? A person is more likely to develop this condition if his or her mother or father had it. It may also develop if the person is: Overweight. Female. 54-70 years old. What are the signs or symptoms? The main symptom of this condition is pain in the upper right side of the abdomen. Typically, the pain: Starts about 30 minutes after a meal, especially a meal that is spicy or greasy. Lasts for 30 minutes or longer. Builds up gradually until it is a steady pain that is severe enough to interrupt daily activities. Other symptoms may include: Nausea or vomiting. Sweating. Cramping or bloating in the abdomen. Heartburn or belching. Diarrhea. How is this diagnosed? This condition may be diagnosed based on your symptoms, your medical history, and a physical exam. You may have tests to rule out other conditions and to confirm the diagnosis. Tests may include: Blood tests. Ultrasound tests of the gallbladder. Hepatobiliary iminodiacetic acid (HIDA) scan. This is an X-ray test that can show if your gallbladder empties less than a normal amount of bile (gallbladder ejection fraction). MRI or CT scan of the abdomen. ERCP (endoscopic retrograde cholangiopancreatogram). During this procedure, a thin tube with a camera on the end is inserted into the throat and down into areas that need to be examined, such as the pancreas, bile ducts, liver, and gallbladder. Dye is injected into your blood, and then X-rays are done. The dye helps your health care provider see the areas to be examined. ERCP  may be done to help diagnose sphincter of Oddi dysfunction. How is this treated? Treatment depends on the cause. Usually, the first step of treatment is to make lifestyle changes. Your health care provider may recommend: Resting. Losing weight. Avoiding foods that are spicy, greasy, or fatty. Taking over-the-counter or prescription pain medicine. In some cases, the condition gets better with lifestyle changes only. However, in many cases, surgery to remove the gallbladder (cholecystectomy) is needed. Follow these instructions at home: Medicines Take over-the-counter and prescription medicines only as told by your health care provider. Ask your health care provider if the medicine prescribed to you: Requires you to avoid driving or using machinery. Can cause constipation. You may need to take these actions to prevent or treat constipation: Drink enough fluid to keep your urine pale yellow. Take over-the-counter or prescription medicines. Eat foods that are high in fiber, such as beans, whole grains, and fresh fruits and vegetables. Limit foods that are high in fat and processed sugars, such as fried or sweet foods. Alcohol use Alcohol can irritate your stomach and your liver. If you drink alcohol: Limit how much you have to: 0-1 drink a day for women who are not pregnant. 0-2 drinks a day for men. Know how much alcohol is  in a drink. In the [place] one drink equals one 12 oz bottle of beer (355 mL), one 5 oz glass of wine (148 mL), or one 1 oz glass of hard liquor (44 mL). General instructions Rest and return to your normal activities as told by your health care provider. Ask your health care provider what activities are safe for you. Follow instructions from your health care provider about eating or drinking restrictions. You may need to limit fatty, greasy, and spicy foods if they cause symptoms. Do not use any products that contain nicotine or tobacco. These products include cigarettes,  chewing tobacco, and vaping devices, such as e-cigarettes. If you need help quitting, ask your health care provider. Smoking can damage your digestive system. Keep all follow-up visits. This is important. Contact a health care provider if: Pain in your abdomen returns. Your symptoms become more severe or continue for more than 3 months. You have any of the following: Nausea or vomiting. Diarrhea. Cramping or bloating in your abdomen. Summary Biliary dyskinesia is a condition in which your gallbladder or bile ducts cannot release or move bile normally. The main symptom of this condition is pain in the upper right side of the abdomen. The pain typically starts about 30 minutes after a meal, especially a meal that is spicy or greasy. Treatment depends on the cause and usually involves lifestyle changes, such as working to lose weight and avoiding certain foods. In many cases, surgery to remove the gallbladder (cholecystectomy) is needed. This information is not intended to replace advice given to you by your health care provider. Make sure you discuss any questions you have with your health care provider. Document Revised: 05/18/2020 Document Reviewed: 05/18/2020 Elsevier Patient Education  2023 [employer]  Biliary Colic, Adult  Biliary colic is severe pain caused by a problem with the gallbladder. The gallbladder is a small organ in the upper right part of the abdomen. The gallbladder stores a digestive fluid produced in the liver (bile) that helps the body break down fat. Bile and other digestive enzymes are carried from the liver to the small intestine through tube-like structures called bile ducts. The gallbladder and the bile ducts form the biliary tract. Sometimes, hard deposits of digestive fluids (gallstones) form in the gallbladder and block the flow of bile from the gallbladder, causing biliary colic. This condition is also called a gallbladder attack. Gallstones can be as small as a  grain of sand or as big as a golf ball. There could be just one gallstone in the gallbladder, or there could be many. What are the causes? This condition is usually caused by gallstones. Less often, a tumor could block the flow of bile from the gallbladder and trigger biliary colic. What increases the risk? The following factors may make you more likely to develop this condition: Being female. Having a family history of gallstones. Being obese. Losing weight suddenly or quickly. Eating a diet that is high in calories, low in fiber, and rich in refined carbohydrates, such as white bread and white rice. Having certain health conditions, such as: An intestinal disease that affects nutrient absorption, such as Crohn's disease. A metabolic condition, such as diabetes or metabolic syndrome. Metabolic syndrome occurs when someone has high blood pressure, high cholesterol, and diabetes. A blood condition, such as hemolytic anemia or sickle cell disease. What are the signs or symptoms? The main symptom of this condition is severe pain in the upper right side of the abdomen. You may feel this  pain below the chest but above the hip. This pain often occurs at night or after eating a meal that is high in fat. This pain may get worse for up to an hour and last as long as 12 hours. In most cases, the pain fades (subsides) within 2 hours. Other symptoms of this condition include: Nausea and vomiting. Pain under the right shoulder. How is this diagnosed? This condition is diagnosed based on your medical history, your symptoms, and a physical exam. You may also have tests, including: Blood tests to rule out infection or inflammation of the bile ducts, gallbladder, pancreas, or liver. Imaging studies, such as: An ultrasound. A CT scan. An MRI. In some cases, you may need to have an imaging study done using a small amount of radioactive material (nuclear medicine) to confirm the diagnosis. How is this  treated? This condition may be treated with medicines to: Relieve your pain or nausea. Dissolve the gallstones. It may take months or years before the gallstones are completely gone. If you have gallstones, or if you have a tumor in the gallbladder that is causing biliary colic, you may need surgery to remove the gallbladder (cholecystectomy). Follow these instructions at home: Eating and drinking Drink enough fluid to keep your urine pale yellow. Follow instructions from your health care provider about eating or drinking restrictions. These may include avoiding: Fatty, greasy, and fried foods. Any foods that make the pain worse. Overeating. Having a large meal after not eating for a while. General instructions Take over-the-counter and prescription medicines only as told by your health care provider. Keep all follow-up visits as told by your health care provider. This is important. How is this prevented? Steps to prevent this condition include: Maintaining a healthy body weight. Getting regular exercise. Eating a healthy diet that is high in fiber and low in fat. Limiting how much sugar and refined carbohydrates you eat. Contact a health care provider if: Your pain lasts more than 5 hours. You vomit. You have a fever and chills. Your pain gets worse. Get help right away if: Your skin or the whites of your eyes look yellow (jaundice). Your have tea-colored urine and light-colored stools (feces). You are dizzy or you faint. Summary Biliary colic is severe pain caused by a problem with the gallbladder. The gallbladder is a small organ in the upper right part of your abdomen. Treatment for this condition may include medicine to relieve your pain or nausea, or medicine to slowly dissolve the gallstones. If you have gallstones, or if you have a tumor in the gallbladder that is causing biliary colic, you may need surgery to remove the gallbladder (cholecystectomy). This information is  not intended to replace advice given to you by your health care provider. Make sure you discuss any questions you have with your health care provider. Document Revised: 08/04/2019 Document Reviewed: 05/26/2019 Elsevier Patient Education  2023 [employer]  Cholelithiasis  Cholelithiasis is a disease in which gallstones form in the gallbladder. The gallbladder is an organ that stores bile. Bile is a fluid that helps to digest fats. Gallstones begin as small crystals and can slowly grow into stones. They may cause no symptoms until they block the gallbladder duct, or cystic duct, when the gallbladder tightens (contracts) after food is eaten. This can cause pain and is known as a gallbladder attack, or biliary colic. There are two main types of gallstones: Cholesterol stones. These are the most common type of gallstone. These stones are made of  hardened cholesterol and are usually yellow-green in color. Cholesterol is a fat-like substance that is made in the liver. Pigment stones. These are dark in color and are made of a red-yellow substance, called bilirubin,that forms when hemoglobin from red blood cells breaks down. What are the causes? This condition may be caused by an imbalance in the different parts that make bile. This can happen if the bile: Has too much bilirubin. This can happen in certain blood diseases, such as sickle cell anemia. Has too much cholesterol. Does not have enough bile salts. These salts help the body absorb and digest fats. In some cases, this condition can also be caused by the gallbladder not emptying completely or often enough. This is common during pregnancy. What increases the risk? The following factors may make you more likely to develop this condition: Being female. Having multiple pregnancies. Health care providers sometimes advise removing diseased gallbladders before future pregnancies. Eating a diet that is heavy in fried foods, fat, and refined  carbohydrates, such as white bread and white rice. Being obese. Being older than age 55. Using medicines that contain female hormones (estrogen) for a long time. Losing weight quickly. Having a family history of gallstones. Having certain medical problems, such as: Diabetes mellitus. Cystic fibrosis. Crohn's disease. Cirrhosis or other long-term (chronic) liver disease. Certain blood diseases, such as sickle cell anemia or leukemia. What are the signs or symptoms? In many cases, having gallstones causes no symptoms. When you have gallstones but do not have symptoms, you have silent gallstones. If a gallstone blocks your bile duct, it can cause a gallbladder attack. The main symptom of a gallbladder attack is sudden pain in the upper right part of the abdomen. The pain: Usually comes at night or after eating. Can last for one hour or more. Can spread to your right shoulder, back, or chest. Can feel like indigestion. This is discomfort, burning, or fullness in your upper abdomen. If the bile duct is blocked for more than a few hours, it can cause an infection or inflammation of your gallbladder (cholecystitis), liver, or pancreas. This can cause: Nausea or vomiting. Bloating. Pain in your abdomen that lasts for 5 hours or longer. Tenderness in your upper abdomen, often in the upper right section and under your rib cage. Fever or chills. Skin or the white parts of your eyes turning yellow (jaundice). This usually happens when a stone has blocked bile from passing through the common bile duct. Dark urine or light-colored stools. How is this diagnosed? This condition may be diagnosed based on: A physical exam. Your medical history. Ultrasound. CT scan. MRI. You may also have other tests, including: Blood tests to check for signs of an infection or inflammation. Cholescintigraphy, or HIDA scan. This is a scan of your gallbladder and bile ducts (biliary system) using non-harmful  radioactive material and special cameras that can see the radioactive material. Endoscopic retrograde cholangiopancreatogram. This involves inserting a small tube with a camera on the end (endoscope) through your mouth to look at bile ducts and check for blockages. How is this treated? Treatment for this condition depends on the severity of the condition. Silent gallstones do not need treatment. Treatment may be needed if a blockage causes a gallbladder attack or other symptoms. Treatment may include: Home care, if symptoms are not severe. During a simple gallbladder attack, stop eating and drinking for 12-24 hours (except for water and clear liquids). This helps to "cool down" your gallbladder. After 1 or 2 days,  you can start to eat a diet of simple or clear foods, such as broths and crackers. You may also need medicines for pain or nausea or both. If you have cholecystitis and an infection, you will need antibiotics. A hospital stay, if needed for pain control or for cholecystitis with severe infection. Cholecystectomy, or surgery to remove your gallbladder. This is the most common treatment if all other treatments have not worked. Medicines to break up gallstones. These are most effective at treating small gallstones. Medicines may be used for up to 6-12 months. Endoscopic retrograde cholangiopancreatogram. A small basket can be attached to the endoscope and used to capture and remove gallstones, mainly those that are in the common bile duct. Follow these instructions at home: Medicines Take over-the-counter and prescription medicines only as told by your health care provider. If you were prescribed an antibiotic medicine, take it as told by your health care provider. Do not stop taking the antibiotic even if you start to feel better. Ask your health care provider if the medicine prescribed to you requires you to avoid driving or using machinery. Eating and drinking Drink enough fluid to keep  your urine pale yellow. This is important during a gallbladder attack. Water and clear liquids are preferred. Follow a healthy diet. This includes: Reducing fatty foods, such as fried food and foods high in cholesterol. Reducing refined carbohydrates, such as white bread and white rice. Eating more fiber. Aim for foods such as almonds, fruit, and beans. Alcohol use If you drink alcohol: Limit how much you use to: 0-1 drink a day for nonpregnant women. 0-2 drinks a day for men. Be aware of how much alcohol is in your drink. In the [place] one drink equals one 12 oz bottle of beer (355 mL), one 5 oz glass of wine (148 mL), or one 1 oz glass of hard liquor (44 mL). General instructions Do not use any products that contain nicotine or tobacco, such as cigarettes, e-cigarettes, and chewing tobacco. If you need help quitting, ask your health care provider. Maintain a healthy weight. Keep all follow-up visits as told by your health care provider. These may include consultations with a surgeon or specialist. This is important. Where to find more information [employer] [facility] [employer] and Digestive [employer] Kidney Diseases: [email or web address] Contact a health care provider if: You think you have had a gallbladder attack. You have been diagnosed with silent gallstones and you develop pain in your abdomen or indigestion. You begin to have attacks more often. You have dark urine or light-colored stools. Get help right away if: You have pain from a gallbladder attack that lasts for more than 2 hours. You have pain in your abdomen that lasts for more than 5 hours or is getting worse. You have a fever or chills. You have nausea and vomiting that do not go away. You develop jaundice. Summary Cholelithiasis is a disease in which gallstones form in the gallbladder. This condition may be caused by an imbalance in the different parts that make bile. This can happen if your bile has too much bilirubin or  cholesterol, or does not have enough bile salts. Treatment for gallstones depends on the severity of the condition. Silent gallstones do not need treatment. If gallstones cause a gallbladder attack or other symptoms, treatment usually involves not eating or drinking anything. Treatment may also include pain medicines and antibiotics, and it sometimes includes a hospital stay. Surgery to remove the gallbladder is common if all other treatments have  not worked. This information is not intended to replace advice given to you by your health care provider. Make sure you discuss any questions you have with your health care provider. Document Revised: 06/15/2019 Document Reviewed: 06/15/2019 Elsevier Patient Education  2023 [employer].

## 2021-12-29 NOTE — Addendum Note (Signed)
Addended by: Quentin Ore on: 12/29/2021 04:25 PM   Modules accepted: Orders

## 2021-12-30 LAB — URINALYSIS, ROUTINE W REFLEX MICROSCOPIC
Bilirubin Urine: NEGATIVE
Glucose, UA: NEGATIVE
Hyaline Cast: NONE SEEN /LPF
Ketones, ur: NEGATIVE
Leukocytes,Ua: NEGATIVE
Nitrite: NEGATIVE
RBC / HPF: NONE SEEN /HPF (ref 0–2)
Specific Gravity, Urine: 1.021 (ref 1.001–1.035)
pH: 7.5 (ref 5.0–8.0)

## 2021-12-30 LAB — MICROSCOPIC MESSAGE

## 2022-01-02 NOTE — Addendum Note (Signed)
Addended by: Orland Mustard on: 01/02/2022 09:10 AM   Modules accepted: Orders

## 2022-01-04 ENCOUNTER — Telehealth: Payer: Self-pay

## 2022-01-04 LAB — H PYLORI, IGM, IGG, IGA AB
H pylori, IgM Abs: <9 units (ref 0.0–8.9)
H. pylori, IgA Abs: <9 units (ref 0.0–8.9)
H. pylori, IgG AbS: 0.05 Index Value (ref 0.00–0.79)

## 2022-01-04 NOTE — Telephone Encounter (Signed)
Pt returning call... Advised pt of note below.... Pt stated that she was just starting her cycle the day after... Pt requesting callback.Alejandra KitchenMarland Cook

## 2022-01-04 NOTE — Telephone Encounter (Signed)
Lvm for pt to return call in regards to lab results.  Per Dr.Tracy: H pylori labs normal  So far all labs normal  Liver kidneys  Blood cts  Lipase no pancreatitis  Urine + blood was she on cycle? If not order and schedule urine culture

## 2022-01-10 ENCOUNTER — Other Ambulatory Visit: Payer: Self-pay | Admitting: General Surgery

## 2022-01-10 DIAGNOSIS — R1011 Right upper quadrant pain: Secondary | ICD-10-CM

## 2022-01-11 ENCOUNTER — Other Ambulatory Visit: Payer: Self-pay

## 2022-01-18 ENCOUNTER — Ambulatory Visit (INDEPENDENT_AMBULATORY_CARE_PROVIDER_SITE_OTHER): Payer: BC Managed Care – PPO | Admitting: Gastroenterology

## 2022-01-18 ENCOUNTER — Encounter: Payer: Self-pay | Admitting: Gastroenterology

## 2022-01-18 VITALS — BP 127/79 | HR 96 | Ht 63.0 in | Wt 143.4 lb

## 2022-01-18 DIAGNOSIS — R1013 Epigastric pain: Secondary | ICD-10-CM

## 2022-01-18 NOTE — Progress Notes (Signed)
Arlyss Repress, MD [address]  [address]  [place], [place] [address]  Main: [phone]  Fax: [phone]    Gastroenterology Consultation  Referring Provider:     Allegra Grana, FNP Primary Care Physician:  Allegra Grana, FNP Primary Gastroenterologist:  Dr. Arlyss Repress Reason for Consultation:  Epigastric pain, nausea, loss of appetite        HPI:   Alejandra Cook is a 41 y.o. female referred by Dr. Allegra Grana, FNP  for consultation & management of epigastric pain, nausea and loss of appetite.  The symptoms started about 3 weeks ago.  She had right upper quadrant ultrasound which revealed 5 mm gallbladder polyp, no evidence of cholelithiasis.  She had H. pylori IgG levels checked which were negative.  She was referred to Dr. Hazle Quant for 5 mm gallbladder polyp, did not recommend cholecystectomy.  She is scheduled to undergo HIDA scan on 02/02/2022.  Patient has not tried any medications yet.  Patient reports that her vomiting is subsided..  She still has some epigastric pain associated with nausea which is worse postprandial.  Her weight has been stable.  She denies any other GI symptoms.  Her constipation is under control.  She has tried Zofran that resulted in constipation  NSAIDs: None  Antiplts/Anticoagulants/Anti thrombotics: None  GI Procedures: None  Past Medical History:  Diagnosis Date   Allergy    Dr. Gwen Pounds, gold and nickel allergy   Anxiety    Bulging discs    Degenerative disc disease, lumbar    Depression    off of zoloft for 1 year, had problems with depression and anxiety after last child   Fibromyalgia    after pregnancy, Followed by [facility]   Fibromyalgia    Headache(784.0)    Dr. Neale Burly at [facility]; migraines since elementary    IBS (irritable bowel syndrome)    Intrahepatic cholestasis of pregnancy in third trimester, antepartum    Spontaneous abortion in second trimester Dec 2014    Past  Surgical History:  Procedure Laterality Date   COLONOSCOPY     normal   COLPOSCOPY  2002   CYSTOSCOPY     ureteral stent inserted   DILATION AND EVACUATION N/A 08/07/2013   Procedure: DILATATION AND EVACUATION;  Surgeon: Zelphia Cairo, MD;  Location: [facility];  Service: Gynecology;  Laterality: N/A;   LAPAROSCOPY N/A 03/20/2013   Procedure: LAPAROSCOPY OPERATIVE  IUD REMOVAL;  Surgeon: Zelphia Cairo, MD;  Location: [facility];  Service: Gynecology;  Laterality: N/A;  Intraabdominal from [facility]   NASAL SINUS SURGERY     PILONIDAL CYST EXCISION     VAGINAL DELIVERY  2011     Current Outpatient Medications:    buPROPion (WELLBUTRIN XL) 300 MG 24 hr tablet, TAKE 1 TABLET BY MOUTH EVERY DAY IN THE MORNING, Disp: 90 tablet, Rfl: 3   butalbital-acetaminophen-caffeine (FIORICET) 50-325-40 MG tablet, Take 1 tablet by mouth as needed., Disp: , Rfl:    cyclobenzaprine (FLEXERIL) 5 MG tablet, Take 1 tablet (5 mg total) by mouth 3 (three) times daily as needed for muscle spasms., Disp: 30 tablet, Rfl: 1   EMGALITY 120 MG/ML SOAJ, Inject into the skin., Disp: , Rfl:    fluticasone (FLONASE) 50 MCG/ACT nasal spray, Place 1 spray into both nostrils daily., Disp: 16 g, Rfl: 0   gabapentin (NEURONTIN) 300 MG capsule, TAKE 2 CAPSULES BY MOUTH EVERY DAY AT NIGHT, Disp: , Rfl:    ketorolac (TORADOL) 10  MG tablet, Take 10 mg by mouth every 6 (six) hours as needed., Disp: , Rfl:    LO LOESTRIN FE 1 MG-10 MCG / 10 MCG tablet, Take 1 tablet by mouth daily. as directed, Disp: , Rfl: 9   ondansetron (ZOFRAN) 4 MG tablet, TAKE 1 TABLET (4 MG TOTAL) BY MOUTH DAILY AS NEEDED FOR NAUSEA OR VOMITING., Disp: 18 tablet, Rfl: 1   promethazine (PHENERGAN) 12.5 MG tablet, Take 1-2 tablets (12.5-25 mg total) by mouth once as needed for up to 1 dose for nausea or vomiting. With migraines., Disp: 20 tablet, Rfl: 2   rizatriptan (MAXALT) 10 MG tablet, Take 1 tablet (10 mg total) by mouth as needed for migraine. May repeat in 2 hours  if needed. Max dose 3 pills in 24 hours, Disp: 10 tablet, Rfl: 1   sertraline (ZOLOFT) 100 MG tablet, Take 100 mg by mouth daily., Disp: , Rfl: 2   SUMAtriptan (IMITREX) 100 MG tablet, Take by mouth as needed., Disp: , Rfl:    Vitamin D, Ergocalciferol, (DRISDOL) 1.25 MG (50000 UNIT) CAPS capsule, Take 1 capsule (50,000 Units total) by mouth every 7 (seven) days., Disp: 12 capsule, Rfl: 0   zonisamide (ZONEGRAN) 100 MG capsule, take 2 tabs at hs, Disp: , Rfl: 2   Family History  Problem Relation Age of Onset   Healthy Mother    Hypertension Father    Cancer Maternal Grandmother        cancer, breast   Cancer Paternal Grandmother        breast   Healthy Sister    Hypertension Paternal Grandfather    Healthy Daughter    Healthy Son      Social History   Tobacco Use   Smoking status: Never   Smokeless tobacco: Never  Vaping Use   Vaping Use: Never used  Substance Use Topics   Alcohol use: No    Alcohol/week: 0.0 standard drinks of alcohol   Drug use: Never    Allergies as of 01/18/2022   (No Known Allergies)    Review of Systems:    All systems reviewed and negative except where noted in HPI.   Physical Exam:  BP 127/79 (BP Location: Right Arm, Patient Position: Sitting, Cuff Size: Normal)   Pulse 96   Ht 5\' 3"  (1.6 m)   Wt 143 lb 6.4 oz (65 kg)   BMI 25.40 kg/m  No LMP recorded.  General:   Alert,  Well-developed, well-nourished, pleasant and cooperative in NAD Head:  Normocephalic and atraumatic. Eyes:  Sclera clear, no icterus.   Conjunctiva pink. Ears:  Normal auditory acuity. Nose:  No deformity, discharge, or lesions. Mouth:  No deformity or lesions,oropharynx pink & moist. Neck:  Supple; no masses or thyromegaly. Lungs:  Respirations even and unlabored.  Clear throughout to auscultation.   No wheezes, crackles, or rhonchi. No acute distress. Heart:  Regular rate and rhythm; no murmurs, clicks, rubs, or gallops. Abdomen:  Normal bowel sounds. Soft,  non-tender and non-distended without masses, hepatosplenomegaly or hernias noted.  No guarding or rebound tenderness.   Rectal: Not performed Msk:  Symmetrical without gross deformities. Good, equal movement & strength bilaterally. Pulses:  Normal pulses noted. Extremities:  No clubbing or edema.  No cyanosis. Neurologic:  Alert and oriented x3;  grossly normal neurologically. Skin:  Intact without significant lesions or rashes. No jaundice. Psych:  Alert and cooperative. Normal mood and affect.  Imaging Studies: Reviewed  Assessment and Plan:    is a 41 y.o. pleasant female with no segment past medical history, history of chronic constipation is seen in consultation for new onset of 3 weeks history of epigastric pain associated with nausea, loss of appetite.  H. pylori IgG negative, right upper quadrant ultrasound revealed 5 mm gallbladder polyp only.  No evidence of cholelithiasis.  HIDA scan is pending.  Recommended patient to try over-the-counter omeprazole 20 mg p.o. twice daily before meals for 2 weeks.  If HIDA scan is negative and symptoms are persistent, recommend upper endoscopy for further evaluation.  Patient expressed understanding of the plan and she will reach out to me via MyChart after the HIDA scan results   Follow up as needed   Arlyss Repress, MD

## 2022-02-02 ENCOUNTER — Ambulatory Visit
Admission: RE | Admit: 2022-02-02 | Discharge: 2022-02-02 | Disposition: A | Discharge status: Home / Self Care | Payer: BC Managed Care – PPO | Source: Ambulatory Visit | Attending: General Surgery | Admitting: General Surgery

## 2022-02-02 DIAGNOSIS — R1011 Right upper quadrant pain: Secondary | ICD-10-CM | POA: Diagnosis present

## 2022-02-02 MED ORDER — TECHNETIUM TC 99M MEBROFENIN IV KIT
4.9700 | PACK | Freq: Once | INTRAVENOUS | Status: AC | PRN
  Administered 2022-02-02: 4.97 via INTRAVENOUS

## 2022-02-07 ENCOUNTER — Encounter: Payer: Self-pay | Admitting: Gastroenterology

## 2022-02-08 ENCOUNTER — Telehealth: Payer: Self-pay | Admitting: General Surgery

## 2022-02-08 NOTE — Telephone Encounter (Signed)
I called patient to discuss results of HIDA scan. Patient did not answered. Voice mail left to try to contact her later.   HIDA scan shows adequate gallbladder ejection fraction. This goes against her gallbladder being the cause of the abdominal pain.

## 2022-04-25 ENCOUNTER — Ambulatory Visit: Admit: 2022-04-25 | Discharge status: Absent without leave | Payer: BC Managed Care – PPO

## 2022-04-25 ENCOUNTER — Ambulatory Visit
Admission: EM | Admit: 2022-04-25 | Discharge: 2022-04-25 | Disposition: A | Discharge status: Home / Self Care | Payer: BC Managed Care – PPO

## 2022-04-25 DIAGNOSIS — J310 Chronic rhinitis: Secondary | ICD-10-CM | POA: Diagnosis not present

## 2022-04-25 DIAGNOSIS — J329 Chronic sinusitis, unspecified: Secondary | ICD-10-CM

## 2022-04-25 MED ORDER — METHYLPREDNISOLONE 4 MG PO TBPK: ORAL_TABLET | ORAL | 0 refills | Status: DC

## 2022-04-25 MED ORDER — AMOXICILLIN-POT CLAVULANATE 875-125 MG PO TABS: 1.0000 | ORAL_TABLET | Freq: Two times a day (BID) | ORAL | 0 refills | Status: DC

## 2022-04-25 NOTE — Discharge Instructions (Addendum)
I have prescribed a steroid for you to use to reduce your sinus inflammation.  I have also prescribed an antibiotic as a backup plan if your symptoms do not improve by the weekend.

## 2022-04-25 NOTE — ED Provider Notes (Signed)
[name]    CSN: [id number] Arrival date & time: 04/25/22  1247      History   Chief Complaint Chief Complaint  Patient presents with   Nasal Congestion   Cough    HPI Evian Yannely Ocejo is a 41 y.o. female.    Cough   Presents to UC with complaint of nasal congestion since Thursday (6 days).  Endorses cough unproductive since the weekend.  She states her sinuses feel clogged and has pain and pressure around her eyes.  Has been using OTC medications for a few days without relief.  Past Medical History:  Diagnosis Date   Allergy    Dr. Nehemiah Massed, gold and nickel allergy   Anxiety    Bulging discs    Degenerative disc disease, lumbar    Depression    off of zoloft for 1 year, had problems with depression and anxiety after last child   Fibromyalgia    after pregnancy, Followed by [facility]   Fibromyalgia    Headache(784.0)    Dr. Domingo Cocking at [facility]; migraines since elementary    IBS (irritable bowel syndrome)    Intrahepatic cholestasis of pregnancy in third trimester, antepartum    Spontaneous abortion in second trimester Dec 2014    Patient Active Problem List   Diagnosis Date Noted   Gallbladder polyp 12/29/2021   Conjunctivitis 11/02/2021   Shingles 02/27/2021   Sinus tachycardia 07/21/2020   Heart palpitations 06/26/2020   Otitis media 06/02/2020   Low back pain 02/23/2020   Amenorrhea 01/25/2020   Depression, major, single episode, mild (HCC) 11/23/2019   Irritable bowel syndrome 09/10/2019   Intractable migraine with aura without status migrainosus 11/07/2018   Contusion of left hand 09/09/2018   Polycythemia 12/01/2017   History of migraine 12/20/2016   Other fatigue 12/20/2016   Primary insomnia 12/20/2016   History of IBS 12/20/2016   History of renal calculi 12/20/2016   History of sinusitis 12/20/2016   Bug bites 01/23/2016   Bronchitis 08/11/2014   Routine general medical examination at a health care facility  01/21/2013   Fibromyalgia 01/17/2012   Allergic rhinitis 01/17/2012   Migraine headache 05/12/2007   DDD (degenerative disc disease), lumbosacral 05/12/2007    Past Surgical History:  Procedure Laterality Date   COLONOSCOPY     normal   COLPOSCOPY  2002   CYSTOSCOPY     ureteral stent inserted   DILATION AND EVACUATION N/A 08/07/2013   Procedure: DILATATION AND EVACUATION;  Surgeon: Marylynn Pearson, MD;  Location: [facility];  Service: Gynecology;  Laterality: N/A;   LAPAROSCOPY N/A 03/20/2013   Procedure: LAPAROSCOPY OPERATIVE  IUD REMOVAL;  Surgeon: Marylynn Pearson, MD;  Location: [facility];  Service: Gynecology;  Laterality: N/A;  Intraabdominal from [facility]   NASAL SINUS SURGERY     PILONIDAL CYST EXCISION     VAGINAL DELIVERY  2011    OB History     Gravida  4   Para  2   Term  1   Preterm  1   AB  2   Living  2      SAB  2   IAB      Ectopic      Multiple  0   Live Births  2            Home Medications    Prior to Admission medications   Medication Sig Start Date End Date Taking? Authorizing Provider  promethazine (PHENERGAN) 12.5 MG tablet Take  1 tablet by mouth every 6 (six) hours as needed. 02/26/22  Yes [provider]  Ubrogepant (UBRELVY) 100 MG TABS TAKE 1 TABLET (100 MG0 BY MOUTH ONCE DAILY AS NEEDED 01/26/22  Yes [provider]  buPROPion (WELLBUTRIN XL) 300 MG 24 hr tablet TAKE 1 TABLET BY MOUTH EVERY DAY IN THE MORNING 06/12/21   Burnard Hawthorne, FNP  butalbital-acetaminophen-caffeine (FIORICET) 50-325-40 MG tablet Take 1 tablet by mouth as needed. 08/04/19   [provider]  cyclobenzaprine (FLEXERIL) 5 MG tablet Take 1 tablet (5 mg total) by mouth 3 (three) times daily as needed for muscle spasms. 02/04/19   Guse, Jacquelynn Cree, FNP  EMGALITY 120 MG/ML SOAJ Inject into the skin. 07/03/21   [provider]  fluticasone (FLONASE) 50 MCG/ACT nasal spray Place 1 spray into both nostrils daily. 05/29/21   Lamptey,  Myrene Galas, MD  gabapentin (NEURONTIN) 300 MG capsule TAKE 2 CAPSULES BY MOUTH EVERY DAY AT NIGHT 01/09/22   [provider]  ketorolac (TORADOL) 10 MG tablet Take 10 mg by mouth every 6 (six) hours as needed. 03/08/21   [provider]  LO LOESTRIN FE 1 MG-10 MCG / 10 MCG tablet Take 1 tablet by mouth daily. as directed 12/19/16   [provider]  methocarbamol (ROBAXIN) 500 MG tablet Take 1 tablet 3 times a day by oral route as needed.    [provider]  methylPREDNISolone (MEDROL DOSEPAK) 4 MG TBPK tablet Take by mouth. 04/06/22   [provider]  ondansetron (ZOFRAN) 4 MG tablet TAKE 1 TABLET (4 MG TOTAL) BY MOUTH DAILY AS NEEDED FOR NAUSEA OR VOMITING. 02/15/18   Burnard Hawthorne, FNP  ondansetron (ZOFRAN) 4 MG tablet Take 1 tablet by mouth every 8 (eight) hours as needed.    [provider]  promethazine (PHENERGAN) 12.5 MG tablet Take 1-2 tablets (12.5-25 mg total) by mouth once as needed for up to 1 dose for nausea or vomiting. With migraines. 10/30/17   McLean-Scocuzza, Nino Glow, MD  rizatriptan (MAXALT) 10 MG tablet Take 1 tablet (10 mg total) by mouth as needed for migraine. May repeat in 2 hours if needed. Max dose 3 pills in 24 hours 07/25/17   McLean-Scocuzza, Nino Glow, MD  sertraline (ZOLOFT) 100 MG tablet Take 100 mg by mouth daily. 11/26/16   [provider]  sertraline (ZOLOFT) 100 MG tablet Take 1 tablet by mouth daily.    [provider]  sulfamethoxazole-trimethoprim (BACTRIM DS) 800-160 MG tablet Take 1 tablet by mouth 2 (two) times daily.    [provider]  SUMAtriptan (IMITREX) 100 MG tablet Take by mouth as needed. 05/04/21   [provider]  Vitamin D, Ergocalciferol, (DRISDOL) 1.25 MG (50000 UNIT) CAPS capsule Take 1 capsule (50,000 Units total) by mouth every 7 (seven) days. 08/11/21   Bo Merino, MD  zonisamide (ZONEGRAN) 100 MG capsule take 2 tabs at hs 12/18/16   [provider]     Family History Family History  Problem Relation Age of Onset   Healthy Mother    Hypertension Father    Cancer Maternal Grandmother        cancer, breast   Cancer Paternal Grandmother        breast   Healthy Sister    Hypertension Paternal Grandfather    Healthy Daughter    Healthy Son     Social History Social History   Tobacco Use   Smoking status: Never   Smokeless tobacco: Never  Vaping Use   Vaping Use: Never used  Substance Use Topics   Alcohol use: No    Alcohol/week: 0.0 standard drinks of alcohol   Drug use: Never     Allergies   Patient has no known allergies.   Review of Systems Review of Systems  Respiratory:  Positive for cough.      Physical Exam Triage Vital Signs ED Triage Vitals  Enc Vitals Group     BP 04/25/22 1317 108/88     Pulse Rate 04/25/22 1317 94     Resp 04/25/22 1317 17     Temp 04/25/22 1317 99.2 F (37.3 C)     Temp src --      SpO2 04/25/22 1317 97 %     Weight --      Height --      Head Circumference --      Peak Flow --      Pain Score 04/25/22 1318 5     Pain Loc --      Pain Edu? --      Excl. in [place]? --    No data found.  Updated Vital Signs BP 108/88   Pulse 94   Temp 99.2 F (37.3 C)   Resp 17   SpO2 97%   Visual Acuity Right Eye Distance:   Left Eye Distance:   Bilateral Distance:    Right Eye Near:   Left Eye Near:    Bilateral Near:     Physical Exam Vitals reviewed.  Constitutional:      Appearance: Normal appearance.  HENT:     Nose:     Right Sinus: Maxillary sinus tenderness and frontal sinus tenderness present.     Left Sinus: Maxillary sinus tenderness and frontal sinus tenderness present.  Cardiovascular:     Rate and Rhythm: Normal rate and regular rhythm.     Pulses: Normal pulses.     Heart sounds: Normal heart sounds.  Pulmonary:     Effort: Pulmonary effort is normal.     Breath sounds: Normal breath sounds.  Skin:    General: Skin is warm and dry.  Neurological:      General: No focal deficit present.     Mental Status: She is alert and oriented to person, place, and time.  Psychiatric:        Mood and Affect: Mood normal.        Behavior: Behavior normal.      UC Treatments / Results  Labs (all labs ordered are listed, but only abnormal results are displayed) Labs Reviewed - No data to display  EKG   Radiology No results found.  Procedures Procedures (including critical care time)  Medications Ordered in UC Medications - No data to display  Initial Impression / Assessment and Plan / UC Course  I have reviewed the triage vital signs and the nursing notes.  Pertinent labs & imaging results that were available during my care of the patient were reviewed by me and considered in my medical decision making (see chart for details).   Patient is nearing the 7 to 10-day threshold where we would expect secondary bacterial rhinosinusitis.  Still suspect viral today and will treat her acute symptoms with a corticosteroid.  Will send antibiotic but ask her to not fill unless her symptoms do not significantly improve within 3 days of using the steroid.  Patient acknowledges these instructions and agree.   Final Clinical Impressions(s) / UC Diagnoses   Final diagnoses:  None   Discharge Instructions   None    ED Prescriptions   None    PDMP not reviewed this encounter.   Rose Phi, [place] 04/25/22 1349

## 2022-04-25 NOTE — ED Triage Notes (Signed)
Pt. Is c/o nasal congestion and a cough since Thursday. She has been treating herself w/ medications for the past few days w/ no relief.

## 2022-05-17 ENCOUNTER — Telehealth: Payer: Self-pay

## 2022-05-17 NOTE — Telephone Encounter (Signed)
Patient is having upper abdominal pain. States it goes from the middle to the left side. She states she had a hida scan on 02/02/2022 and she states she thinks there was a spot on her gallbladder but not gallstones She states 3 days ago at night she had a attack with severe upper abdominal pain that is in the middle to left side. She states the pain is very sharp and only last a couple minutes. She states it happen again this morning the same way. She took some tums and antinausea  medication this morning because she said she had to go to work. Patient states she wants to know what you recommend

## 2022-05-21 NOTE — Telephone Encounter (Signed)
Based on our discussion during last visit, given that her symptoms are recurring and HIDA scan is negative, next step would be to do an upper endoscopy if patient is agreeable The spot on the gallbladder was a 5 mm polyp and her surgeon, Windell Moment is aware, the polyp is benign.  Gallbladder polyp is not the cause of her symptoms  Alejandra Sear, MD

## 2022-05-21 NOTE — Telephone Encounter (Signed)
Called and left a message for call back  

## 2022-05-22 ENCOUNTER — Other Ambulatory Visit: Payer: Self-pay

## 2022-05-22 DIAGNOSIS — R1013 Epigastric pain: Secondary | ICD-10-CM

## 2022-05-22 NOTE — Telephone Encounter (Addendum)
Tried to call patient but mailbox is full.  Sent mychart message

## 2022-05-23 ENCOUNTER — Other Ambulatory Visit: Payer: Self-pay

## 2022-05-24 ENCOUNTER — Ambulatory Visit: Payer: BC Managed Care – PPO | Admitting: Anesthesiology

## 2022-05-24 ENCOUNTER — Encounter: Payer: Self-pay | Admitting: Gastroenterology

## 2022-05-24 ENCOUNTER — Other Ambulatory Visit: Payer: Self-pay

## 2022-05-24 ENCOUNTER — Encounter
Admission: RE | Disposition: A | Discharge status: Home / Self Care | Payer: Self-pay | Source: Home / Self Care | Attending: Gastroenterology

## 2022-05-24 ENCOUNTER — Ambulatory Visit
Admission: RE | Admit: 2022-05-24 | Discharge: 2022-05-24 | Disposition: A | Discharge status: Home / Self Care | Payer: BC Managed Care – PPO | Attending: Gastroenterology | Admitting: Gastroenterology

## 2022-05-24 DIAGNOSIS — R112 Nausea with vomiting, unspecified: Secondary | ICD-10-CM | POA: Diagnosis not present

## 2022-05-24 DIAGNOSIS — K319 Disease of stomach and duodenum, unspecified: Secondary | ICD-10-CM | POA: Diagnosis not present

## 2022-05-24 DIAGNOSIS — E755 Other lipid storage disorders: Secondary | ICD-10-CM | POA: Diagnosis not present

## 2022-05-24 DIAGNOSIS — F418 Other specified anxiety disorders: Secondary | ICD-10-CM | POA: Insufficient documentation

## 2022-05-24 DIAGNOSIS — K2970 Gastritis, unspecified, without bleeding: Secondary | ICD-10-CM | POA: Insufficient documentation

## 2022-05-24 DIAGNOSIS — K3189 Other diseases of stomach and duodenum: Secondary | ICD-10-CM | POA: Diagnosis not present

## 2022-05-24 DIAGNOSIS — M797 Fibromyalgia: Secondary | ICD-10-CM | POA: Insufficient documentation

## 2022-05-24 DIAGNOSIS — R1013 Epigastric pain: Secondary | ICD-10-CM | POA: Diagnosis not present

## 2022-05-24 DIAGNOSIS — K259 Gastric ulcer, unspecified as acute or chronic, without hemorrhage or perforation: Secondary | ICD-10-CM

## 2022-05-24 DIAGNOSIS — K589 Irritable bowel syndrome without diarrhea: Secondary | ICD-10-CM | POA: Diagnosis not present

## 2022-05-24 HISTORY — PX: ESOPHAGOGASTRODUODENOSCOPY (EGD) WITH PROPOFOL: SHX5813

## 2022-05-24 LAB — POCT PREGNANCY, URINE: Preg Test, Ur: NEGATIVE

## 2022-05-24 SURGERY — ESOPHAGOGASTRODUODENOSCOPY (EGD) WITH PROPOFOL
Anesthesia: General | Site: Mouth

## 2022-05-24 MED ORDER — STERILE WATER FOR IRRIGATION IR SOLN
Status: DC | PRN
  Administered 2022-05-24: 250 mL

## 2022-05-24 MED ORDER — PROPOFOL 10 MG/ML IV BOLUS
INTRAVENOUS | Status: DC | PRN
  Administered 2022-05-24: 75 mg via INTRAVENOUS
  Administered 2022-05-24 (×2): 100 mg via INTRAVENOUS

## 2022-05-24 MED ORDER — LIDOCAINE HCL (CARDIAC) PF 100 MG/5ML IV SOSY
PREFILLED_SYRINGE | INTRAVENOUS | Status: DC | PRN
  Administered 2022-05-24: 100 mg via INTRAVENOUS

## 2022-05-24 MED ORDER — LACTATED RINGERS IV SOLN: INTRAVENOUS | Status: DC

## 2022-05-24 SURGICAL SUPPLY — 8 items
BLOCK BITE 60FR ADLT L/F GRN (MISCELLANEOUS) ×1 IMPLANT
FORCEPS BIOP RAD 4 LRG CAP 4 (CUTTING FORCEPS) IMPLANT
GOWN CVR UNV OPN BCK APRN NK (MISCELLANEOUS) ×2 IMPLANT
GOWN ISOL THUMB LOOP REG UNIV (MISCELLANEOUS) ×2
KIT PRC NS LF DISP ENDO (KITS) ×1 IMPLANT
KIT PROCEDURE OLYMPUS (KITS) ×1
MANIFOLD NEPTUNE II (INSTRUMENTS) ×1 IMPLANT
WATER STERILE IRR 250ML POUR (IV SOLUTION) ×1 IMPLANT

## 2022-05-24 NOTE — H&P (Signed)
Cephas Darby, MD [address]  [place]  [place], [place] [address]  Main: [phone]  Fax: [phone] Pager: [phone]  Primary Care Physician:  Burnard Hawthorne, FNP Primary Gastroenterologist:  Dr. Cephas Darby  Pre-Procedure History & Physical: HPI:  Alejandra Cook is a 41 y.o. female is here for an endoscopy.   Past Medical History:  Diagnosis Date   Allergy    Dr. Nehemiah Massed, gold and nickel allergy   Anxiety    Bulging discs    Degenerative disc disease, lumbar    Depression    off of zoloft for 1 year, had problems with depression and anxiety after last child   Fibromyalgia    after pregnancy, Followed by [facility]   Fibromyalgia    Headache(784.0)    Dr. Domingo Cocking at [facility]; migraines since elementary    IBS (irritable bowel syndrome)    Intrahepatic cholestasis of pregnancy in third trimester, antepartum    Spontaneous abortion in second trimester Dec 2014    Past Surgical History:  Procedure Laterality Date   COLONOSCOPY     normal   COLPOSCOPY  2002   CYSTOSCOPY     ureteral stent inserted   DILATION AND EVACUATION N/A 08/07/2013   Procedure: DILATATION AND EVACUATION;  Surgeon: Marylynn Pearson, MD;  Location: [facility];  Service: Gynecology;  Laterality: N/A;   LAPAROSCOPY N/A 03/20/2013   Procedure: LAPAROSCOPY OPERATIVE  IUD REMOVAL;  Surgeon: Marylynn Pearson, MD;  Location: [facility];  Service: Gynecology;  Laterality: N/A;  Intraabdominal from [place]   NASAL SINUS SURGERY     PILONIDAL CYST EXCISION     VAGINAL DELIVERY  2011    Prior to Admission medications   Medication Sig Start Date End Date Taking? Authorizing Provider  amoxicillin-clavulanate (AUGMENTIN) 875-125 MG tablet Take 1 tablet by mouth every 12 (twelve) hours. 04/25/22  Yes Immordino, Annie Main, FNP  buPROPion (WELLBUTRIN XL) 300 MG 24 hr tablet TAKE 1 TABLET BY MOUTH EVERY DAY IN THE MORNING 06/12/21  Yes Arnett, Yvetta Coder, FNP   butalbital-acetaminophen-caffeine (FIORICET) 50-325-40 MG tablet Take 1 tablet by mouth as needed. 08/04/19  Yes [provider]  cyclobenzaprine (FLEXERIL) 5 MG tablet Take 1 tablet (5 mg total) by mouth 3 (three) times daily as needed for muscle spasms. 02/04/19  Yes Guse, Jacquelynn Cree, FNP  EMGALITY 120 MG/ML SOAJ Inject into the skin. 07/03/21  Yes [provider]  gabapentin (NEURONTIN) 300 MG capsule TAKE 2 CAPSULES BY MOUTH EVERY DAY AT NIGHT 01/09/22  Yes [provider]  LO LOESTRIN FE 1 MG-10 MCG / 10 MCG tablet Take 1 tablet by mouth daily. as directed 12/19/16  Yes [provider]  methocarbamol (ROBAXIN) 500 MG tablet Take 1 tablet 3 times a day by oral route as needed.   Yes [provider]  ondansetron (ZOFRAN) 4 MG tablet TAKE 1 TABLET (4 MG TOTAL) BY MOUTH DAILY AS NEEDED FOR NAUSEA OR VOMITING. 02/15/18  Yes Arnett, Yvetta Coder, FNP  ondansetron (ZOFRAN) 4 MG tablet Take 1 tablet by mouth every 8 (eight) hours as needed.   Yes [provider]  promethazine (PHENERGAN) 12.5 MG tablet Take 1-2 tablets (12.5-25 mg total) by mouth once as needed for up to 1 dose for nausea or vomiting. With migraines. 10/30/17  Yes McLean-Scocuzza, Nino Glow, MD  promethazine (PHENERGAN) 12.5 MG tablet Take 1 tablet by mouth every 6 (six) hours as needed. 02/26/22  Yes [provider]  rizatriptan (MAXALT) 10  MG tablet Take 1 tablet (10 mg total) by mouth as needed for migraine. May repeat in 2 hours if needed. Max dose 3 pills in 24 hours 07/25/17  Yes McLean-Scocuzza, Pasty Spillers, MD  sertraline (ZOLOFT) 100 MG tablet Take 1 tablet by mouth daily.   Yes [provider]  SUMAtriptan (IMITREX) 100 MG tablet Take by mouth as needed. 05/04/21  Yes [provider]  Vitamin D, Ergocalciferol, (DRISDOL) 1.25 MG (50000 UNIT) CAPS capsule Take 1 capsule (50,000 Units total) by mouth every 7 (seven) days. 08/11/21  Yes Deveshwar, Janalyn Rouse, MD  zonisamide  (ZONEGRAN) 100 MG capsule take 2 tabs at hs 12/18/16  Yes [provider]  fluticasone (FLONASE) 50 MCG/ACT nasal spray Place 1 spray into both nostrils daily. Patient not taking: Reported on 05/23/2022 05/29/21   Merrilee Jansky, MD  ketorolac (TORADOL) 10 MG tablet Take 10 mg by mouth every 6 (six) hours as needed. Patient not taking: Reported on 05/23/2022 03/08/21   [provider]  methylPREDNISolone (MEDROL DOSEPAK) 4 MG TBPK tablet Steroid taper. Take as directed by packaging. Patient not taking: Reported on 05/23/2022 04/25/22   Immordino, Jeannett Senior, FNP  sertraline (ZOLOFT) 100 MG tablet Take 100 mg by mouth daily. Patient not taking: Reported on 05/23/2022 11/26/16   [provider]  sulfamethoxazole-trimethoprim (BACTRIM DS) 800-160 MG tablet Take 1 tablet by mouth 2 (two) times daily. Patient not taking: Reported on 05/23/2022    [provider]  Ubrogepant (UBRELVY) 100 MG TABS TAKE 1 TABLET (100 MG0 BY MOUTH ONCE DAILY AS NEEDED Patient not taking: Reported on 05/23/2022 01/26/22   [provider]    Allergies as of 05/22/2022   (No Known Allergies)    Family History  Problem Relation Age of Onset   Healthy Mother    Hypertension Father    Cancer Maternal Grandmother        cancer, breast   Cancer Paternal Grandmother        breast   Healthy Sister    Hypertension Paternal Actor    Healthy Daughter    Healthy Son     Social History   Socioeconomic History   Marital status: Married    Spouse name: Not on file   Number of children: Not on file   Years of education: Not on file   Highest education level: Not on file  Occupational History   Not on file  Tobacco Use   Smoking status: Never   Smokeless tobacco: Never  Vaping Use   Vaping Use: Never used  Substance and Sexual Activity   Alcohol use: No    Alcohol/week: 0.0 standard drinks of alcohol   Drug use: Never   Sexual activity: Yes  Other Topics Concern    Not on file  Social History Narrative   Lives in [place]. 10YO daughter. 5 YO son.      Works- 10th and 11th; [employer]      Social Determinants of Health   Financial Resource Strain: Not on file  Food Insecurity: Not on file  Transportation Needs: Not on file  Physical Activity: Not on file  Stress: Not on file  Social Connections: Not on file  Intimate Partner Violence: Not on file    Review of Systems: See HPI, otherwise negative ROS  Physical Exam: BP 127/87   Temp 98.1 F (36.7 C) (Tympanic)   Resp 13   Ht 5' 2.99" (1.6 m)   Wt 68.2 kg   LMP  (LMP  Unknown)   SpO2 100%   BMI 26.65 kg/m  General:   Alert,  pleasant and cooperative in NAD Head:  Normocephalic and atraumatic. Neck:  Supple; no masses or thyromegaly. Lungs:  Clear throughout to auscultation.    Heart:  Regular rate and rhythm. Abdomen:  Soft, nontender and nondistended. Normal bowel sounds, without guarding, and without rebound.   Neurologic:  Alert and  oriented x4;  grossly normal neurologically.  Impression/Plan: Alejandra Cook is here for an endoscopy to be performed for epigatsric pain, nausea, vomiting  Risks, benefits, limitations, and alternatives regarding  endoscopy have been reviewed with the patient.  Questions have been answered.  All parties agreeable.   Lannette Donath, MD  05/24/2022, 1:46 PM

## 2022-05-24 NOTE — Transfer of Care (Signed)
Immediate Anesthesia Transfer of Care Note  Patient: Alejandra Cook  Procedure(s) Performed: ESOPHAGOGASTRODUODENOSCOPY (EGD) WITH PROPOFOL (Mouth)  Patient Location: PACU  Anesthesia Type: MAC  Level of Consciousness: awake, alert  and patient cooperative  Airway and Oxygen Therapy: Patient Spontanous Breathing and Patient connected to supplemental oxygen  Post-op Assessment: Post-op Vital signs reviewed, Patient's Cardiovascular Status Stable, Respiratory Function Stable, Patent Airway and No signs of Nausea or vomiting  Post-op Vital Signs: Reviewed and stable  Complications: No notable events documented.

## 2022-05-24 NOTE — Anesthesia Preprocedure Evaluation (Signed)
Anesthesia Evaluation  Patient identified by MRN, date of birth, ID band Patient awake    Reviewed: Allergy & Precautions, H&P , NPO status , Patient's Chart, lab work & pertinent test results, reviewed documented beta blocker date and time   History of Anesthesia Complications Negative for: history of anesthetic complications  Airway Mallampati: I  TM Distance: >3 FB Neck ROM: full    Dental  (+) Dental Advidsory Given, Teeth Intact   Pulmonary neg pulmonary ROS,    Pulmonary exam normal breath sounds clear to auscultation       Cardiovascular Exercise Tolerance: Good (-) hypertension(-) angina(-) Past MI and (-) Cardiac Stents negative cardio ROS Normal cardiovascular exam(-) dysrhythmias (-) Valvular Problems/Murmurs Rhythm:regular Rate:Normal     Neuro/Psych  Headaches, neg Seizures PSYCHIATRIC DISORDERS Anxiety Depression  Neuromuscular disease (fibromyalgia)    GI/Hepatic negative GI ROS, Neg liver ROS,   Endo/Other  negative endocrine ROS  Renal/GU negative Renal ROS  negative genitourinary   Musculoskeletal   Abdominal   Peds  Hematology negative hematology ROS (+)   Anesthesia Other Findings Past Medical History: No date: Allergy     Comment:  Dr. Nehemiah Massed, gold and nickel allergy No date: Anxiety No date: Bulging discs No date: Degenerative disc disease, lumbar No date: Depression     Comment:  off of zoloft for 1 year, had problems with depression               and anxiety after last child No date: Fibromyalgia     Comment:  after pregnancy, Followed by [facility]age No date: Fibromyalgia No date: Headache(784.0)     Comment:  Dr. Domingo Cocking at [facility]; migraines since               elementary  No date: IBS (irritable bowel syndrome) No date: Intrahepatic cholestasis of pregnancy in third trimester,  antepartum Dec 2014: Spontaneous abortion in second trimester    Reproductive/Obstetrics negative OB ROS                             Anesthesia Physical Anesthesia Plan  ASA: 2  Anesthesia Plan: General   Post-op Pain Management:    Induction: Intravenous  PONV Risk Score and Plan: 3 and Propofol infusion and TIVA  Airway Management Planned: Natural Airway and Nasal Cannula  Additional Equipment:   Intra-op Plan:   Post-operative Plan:   Informed Consent: I have reviewed the patients History and Physical, chart, labs and discussed the procedure including the risks, benefits and alternatives for the proposed anesthesia with the patient or authorized representative who has indicated his/her understanding and acceptance.     Dental Advisory Given  Plan Discussed with: Anesthesiologist, CRNA and Surgeon  Anesthesia Plan Comments:         Anesthesia Quick Evaluation

## 2022-05-24 NOTE — Op Note (Signed)
[facility] Patient Name: Alejandra Cook Procedure Date: 05/24/2022 2:06 PM MRN: [id number] Account #: [id number] Date of Birth: [birth year] Admit Type: Outpatient Age: 41 Room: [facility] OR ROOM 01 Gender: Female Note Status: Finalized Instrument Name: Rolly Salter [id number] Procedure:             Upper GI endoscopy Indications:           Epigastric abdominal pain, Nausea with vomiting Providers:             Toney Reil MD, MD Referring MD:          Lyn Records. Arnett (Referring MD) Medicines:             General Anesthesia Complications:         No immediate complications. Estimated blood loss: None. Procedure:             Pre-Anesthesia Assessment:                        - Prior to the procedure, a History and Physical was                         performed, and patient medications and allergies were                         reviewed. The patient is competent. The risks and                         benefits of the procedure and the sedation options and                         risks were discussed with the patient. All questions                         were answered and informed consent was obtained.                         Patient identification and proposed procedure were                         verified by the physician, the nurse, the                         anesthesiologist, the anesthetist and the technician                         in the pre-procedure area in the procedure room in the                         endoscopy suite. Mental Status Examination: alert and                         oriented. Airway Examination: normal oropharyngeal                         airway and neck mobility. Respiratory Examination:                         clear to auscultation. CV Examination: normal.  Prophylactic Antibiotics: The patient does not require                         prophylactic antibiotics. Prior Anticoagulants: The                          patient has taken no previous anticoagulant or                         antiplatelet agents. ASA Grade Assessment: II - A                         patient with mild systemic disease. After reviewing                         the risks and benefits, the patient was deemed in                         satisfactory condition to undergo the procedure. The                         anesthesia plan was to use general anesthesia.                         Immediately prior to administration of medications,                         the patient was re-assessed for adequacy to receive                         sedatives. The heart rate, respiratory rate, oxygen                         saturations, blood pressure, adequacy of pulmonary                         ventilation, and response to care were monitored                         throughout the procedure. The physical status of the                         patient was re-assessed after the procedure.                        After obtaining informed consent, the endoscope was                         passed under direct vision. Throughout the procedure,                         the patient's blood pressure, pulse, and oxygen                         saturations were monitored continuously. The was                         introduced through the mouth, and advanced to the  second part of duodenum. The upper GI endoscopy was                         accomplished without difficulty. The patient tolerated                         the procedure well. Findings:      The duodenal bulb and second portion of the duodenum were normal.       Biopsies were taken with a cold forceps for histology.      A few dispersed diminutive erosions with no bleeding and no stigmata of       recent bleeding were found in the gastric antrum. Biopsies were taken       with a cold forceps for Helicobacter pylori testing.      Diffuse mildly erythematous mucosa without  bleeding was found in the       gastric body. Biopsies were taken with a cold forceps for Helicobacter       pylori testing.      The cardia and gastric fundus were normal on retroflexion.      The gastroesophageal junction and examined esophagus were normal. Impression:            - Normal duodenal bulb and second portion of the                         duodenum. Biopsied.                        - Erosive gastropathy with no bleeding and no stigmata                         of recent bleeding. Biopsied.                        - Erythematous mucosa in the gastric body. Biopsied.                        - Normal gastroesophageal junction and esophagus. Recommendation:        - Discharge patient to home (with escort).                        - Resume previous diet today.                        - Continue present medications.                        - Await pathology results. Procedure Code(s):     --- Professional ---                        [phone], Esophagogastroduodenoscopy, flexible,                         transoral; with biopsy, single or multiple Diagnosis Code(s):     --- Professional ---                        K31.89, Other diseases of stomach and duodenum  R10.13, Epigastric pain                        R11.2, Nausea with vomiting, unspecified CPT copyright 2019 American Medical Association. All rights reserved. The codes documented in this report are preliminary and upon coder review may  be revised to meet current compliance requirements. Dr. Libby Maw Toney Reil MD, MD 05/24/2022 2:31:44 PM This report has been signed electronically. Number of Addenda: 0 Note Initiated On: 05/24/2022 2:06 PM Total Procedure Duration: 0 hours 6 minutes 44 seconds  Estimated Blood Loss:  Estimated blood loss: none.      [facility]

## 2022-05-24 NOTE — Anesthesia Postprocedure Evaluation (Signed)
Anesthesia Post Note  Patient: Takako Minckler  Procedure(s) Performed: ESOPHAGOGASTRODUODENOSCOPY (EGD) WITH PROPOFOL (Mouth)  Patient location during evaluation: PACU Anesthesia Type: General Level of consciousness: awake and alert Pain management: pain level controlled Vital Signs Assessment: post-procedure vital signs reviewed and stable Respiratory status: spontaneous breathing, nonlabored ventilation, respiratory function stable and patient connected to nasal cannula oxygen Cardiovascular status: blood pressure returned to baseline and stable Postop Assessment: no apparent nausea or vomiting Anesthetic complications: no   No notable events documented.   Last Vitals:  Vitals:   05/24/22 1434 05/24/22 1444  BP: 114/80 116/83  Pulse: 99 78  Resp: 17 17  Temp: (!) 36.4 C (!) 36.4 C  SpO2: 99% 99%    Last Pain:  Vitals:   05/24/22 1444  TempSrc:   PainSc: 0-No pain                 Martha Clan

## 2022-05-25 ENCOUNTER — Encounter: Payer: Self-pay | Admitting: Gastroenterology

## 2022-05-28 LAB — SURGICAL PATHOLOGY

## 2022-05-31 ENCOUNTER — Telehealth: Payer: BC Managed Care – PPO | Admitting: Emergency Medicine

## 2022-05-31 DIAGNOSIS — J329 Chronic sinusitis, unspecified: Secondary | ICD-10-CM

## 2022-05-31 MED ORDER — DOXYCYCLINE HYCLATE 100 MG PO TABS: 100.0000 mg | ORAL_TABLET | Freq: Two times a day (BID) | ORAL | 0 refills | Status: DC

## 2022-05-31 NOTE — Progress Notes (Signed)
E-Visit for Sinus Problems  We are sorry that you are not feeling well.  Here is how we plan to help!  Thank you for the additional information about your illness.  If this treatment plan does not clear up your infection, you should see your regular primary care provider for further help.   Based on what you have shared with me it looks like you have sinusitis.  Sinusitis is inflammation and infection in the sinus cavities of the head.  Based on your presentation I believe you most likely have Acute Bacterial Sinusitis.  This is an infection caused by bacteria and is treated with antibiotics. I have prescribed Doxycycline 100mg  by mouth twice a day for 10 days.   You may use an oral decongestant such as Mucinex D or if you have glaucoma or high blood pressure use plain Mucinex.   Saline nasal spray help and can safely be used as often as needed for congestion. Try using saline irrigation, such as with a neti pot, several times a day while you are sick. Many neti pots come with salt packets premeasured to use to make saline. If you use your own salt, make sure it is kosher salt or sea salt (don't use table salt as it has iodine in it and you don't need that in your nose). Use distilled water to make saline. If you mix your own saline using your own salt, the recipe is 1/4 teaspoon salt in 1 cup warm water. Using saline irrigation can help prevent and treat sinus infections.   If you develop worsening sinus pain, fever or notice severe headache and vision changes, or if symptoms are not better after completion of antibiotic, please schedule an appointment with a health care provider.    Sinus infections are not as easily transmitted as other respiratory infection, however we still recommend that you avoid close contact with loved ones, especially the very young and elderly.  Remember to wash your hands thoroughly throughout the day as this is the number one way to prevent the spread of infection!  Home  Care: Only take medications as instructed by your medical team. Complete the entire course of an antibiotic. Do not take these medications with alcohol. A steam or ultrasonic humidifier can help congestion.  You can place a towel over your head and breathe in the steam from hot water coming from a faucet. Avoid close contacts especially the very young and the elderly. Cover your mouth when you cough or sneeze. Always remember to wash your hands.  Get Help Right Away If: You develop worsening fever or sinus pain. You develop a severe head ache or visual changes. Your symptoms persist after you have completed your treatment plan.  Make sure you Understand these instructions. Will watch your condition. Will get help right away if you are not doing well or get worse.  Thank you for choosing an e-visit.  Your e-visit answers were reviewed by a board certified advanced clinical practitioner to complete your personal care plan. Depending upon the condition, your plan could have included both over the counter or prescription medications.  Please review your pharmacy choice. Make sure the pharmacy is open so you can pick up prescription now. If there is a problem, you may contact your provider through [employer] and have the prescription routed to another pharmacy.  Your safety is important to [place]. If you have drug allergies check your prescription carefully.   For the next 24 hours you can use  MyChart to ask questions about today's visit, request a non-urgent call back, or ask for a work or school excuse. You will get an email in the next two days asking about your experience. I hope that your e-visit has been valuable and will speed your recovery.  I have spent 5 minutes in review of e-visit questionnaire, review and updating patient chart, medical decision making and response to patient.   Rica Mast, PhD, FNP-BC

## 2022-06-19 ENCOUNTER — Other Ambulatory Visit: Payer: Self-pay | Admitting: Family

## 2022-06-19 DIAGNOSIS — F320 Major depressive disorder, single episode, mild: Secondary | ICD-10-CM

## 2022-06-21 NOTE — Telephone Encounter (Signed)
LVM to call back in regards to refill

## 2022-06-26 NOTE — Telephone Encounter (Signed)
LVM to call back to office  

## 2022-08-05 ENCOUNTER — Other Ambulatory Visit: Payer: Self-pay | Admitting: Rheumatology

## 2022-08-05 DIAGNOSIS — E559 Vitamin D deficiency, unspecified: Secondary | ICD-10-CM

## 2022-09-15 ENCOUNTER — Ambulatory Visit
Admission: RE | Admit: 2022-09-15 | Discharge: 2022-09-15 | Disposition: A | Discharge status: Home / Self Care | Payer: BC Managed Care – PPO | Source: Ambulatory Visit

## 2022-09-15 VITALS — BP 122/85 | HR 113 | Temp 98.9°F | Resp 18

## 2022-09-15 DIAGNOSIS — R051 Acute cough: Secondary | ICD-10-CM

## 2022-09-15 DIAGNOSIS — J0100 Acute maxillary sinusitis, unspecified: Secondary | ICD-10-CM | POA: Diagnosis not present

## 2022-09-15 DIAGNOSIS — H6692 Otitis media, unspecified, left ear: Secondary | ICD-10-CM

## 2022-09-15 MED ORDER — AMOXICILLIN 875 MG PO TABS: 875.0000 mg | ORAL_TABLET | Freq: Two times a day (BID) | ORAL | 0 refills | Status: AC

## 2022-09-15 NOTE — ED Provider Notes (Signed)
Alejandra Cook    CSN: [id number] Arrival date & time: 09/15/22  1031      History   Chief Complaint Chief Complaint  Patient presents with   Cough    sore throat, Chest congestion, ear pain, sinus pressure - Entered by patient    HPI Alejandra Cook is a 42 y.o. female.  Patient presents with 3 day history of ear pain, sore throat, congestion, sinus pressure, runny nose, cough, headache.  Treatment with Tylenol and Mucinex.  No fever, rash, shortness of breath, vomiting, diarrhea, or other symptoms.    The history is provided by the patient and medical records.    Past Medical History:  Diagnosis Date   Allergy    Dr. Nehemiah Massed, gold and nickel allergy   Anxiety    Bulging discs    Degenerative disc disease, lumbar    Depression    off of zoloft for 1 year, had problems with depression and anxiety after last child   Fibromyalgia    after pregnancy, Followed by [facility]   Fibromyalgia    Headache(784.0)    Dr. Domingo Cocking at [place]; migraines since elementary    IBS (irritable bowel syndrome)    Intrahepatic cholestasis of pregnancy in third trimester, antepartum    Spontaneous abortion in second trimester Dec 2014    Patient Active Problem List   Diagnosis Date Noted   Gastric erosion    Gastric erythema    Gallbladder polyp 12/29/2021   Conjunctivitis 11/02/2021   Shingles 02/27/2021   Sinus tachycardia 07/21/2020   Heart palpitations 06/26/2020   Otitis media 06/02/2020   Low back pain 02/23/2020   Amenorrhea 01/25/2020   Depression, major, single episode, mild ([place]) 11/23/2019   Irritable bowel syndrome 09/10/2019   Intractable migraine with aura without status migrainosus 11/07/2018   Contusion of left hand 09/09/2018   Polycythemia 12/01/2017   History of migraine 12/20/2016   Other fatigue 12/20/2016   Primary insomnia 12/20/2016   History of IBS 12/20/2016   History of renal calculi 12/20/2016   History of sinusitis  12/20/2016   Bug bites 01/23/2016   Bronchitis 08/11/2014   Routine general medical examination at a health care facility 01/21/2013   Fibromyalgia 01/17/2012   Allergic rhinitis 01/17/2012   Migraine headache 05/12/2007   DDD (degenerative disc disease), lumbosacral 05/12/2007    Past Surgical History:  Procedure Laterality Date   COLONOSCOPY     normal   COLPOSCOPY  2002   CYSTOSCOPY     ureteral stent inserted   DILATION AND EVACUATION N/A 08/07/2013   Procedure: DILATATION AND EVACUATION;  Surgeon: Marylynn Pearson, MD;  Location: [facility];  Service: Gynecology;  Laterality: N/A;   ESOPHAGOGASTRODUODENOSCOPY (EGD) WITH PROPOFOL N/A 05/24/2022   Procedure: ESOPHAGOGASTRODUODENOSCOPY (EGD) WITH PROPOFOL;  Surgeon: Lin Landsman, MD;  Location: [facility];  Service: Endoscopy;  Laterality: N/A;   LAPAROSCOPY N/A 03/20/2013   Procedure: LAPAROSCOPY OPERATIVE  IUD REMOVAL;  Surgeon: Marylynn Pearson, MD;  Location: [facility];  Service: Gynecology;  Laterality: N/A;  Intraabdominal from [place]   NASAL SINUS SURGERY     PILONIDAL CYST EXCISION     VAGINAL DELIVERY  2011    OB History     Gravida  4   Para  2   Term  1   Preterm  1   AB  2   Living  2      SAB  2   IAB  Ectopic      Multiple  0   Live Births  2            Home Medications    Prior to Admission medications   Medication Sig Start Date End Date Taking? Authorizing Provider  amoxicillin (AMOXIL) 875 MG tablet Take 1 tablet (875 mg total) by mouth 2 (two) times daily for 10 days. 09/15/22 09/25/22 Yes Sharion Balloon, NP  buPROPion (WELLBUTRIN XL) 300 MG 24 hr tablet TAKE 1 TABLET BY MOUTH EVERY DAY IN THE MORNING 07/03/22   Burnard Hawthorne, FNP  butalbital-acetaminophen-caffeine (FIORICET) 50-325-40 MG tablet Take 1 tablet by mouth as needed. 08/04/19   [provider]  cyclobenzaprine (FLEXERIL) 5 MG tablet Take 1 tablet (5 mg total) by mouth 3 (three) times daily as  needed for muscle spasms. Patient not taking: Reported on 09/15/2022 02/04/19   Jodelle Green, FNP  EMGALITY 120 MG/ML SOAJ Inject into the skin. 07/03/21   [provider]  gabapentin (NEURONTIN) 300 MG capsule TAKE 2 CAPSULES BY MOUTH EVERY DAY AT NIGHT 01/09/22   [provider]  LO LOESTRIN FE 1 MG-10 MCG / 10 MCG tablet Take 1 tablet by mouth daily. as directed 12/19/16   [provider]  methocarbamol (ROBAXIN) 500 MG tablet Take 1 tablet 3 times a day by oral route as needed. Patient not taking: Reported on 09/15/2022    [provider]  ondansetron (ZOFRAN) 4 MG tablet TAKE 1 TABLET (4 MG TOTAL) BY MOUTH DAILY AS NEEDED FOR NAUSEA OR VOMITING. 02/15/18   Burnard Hawthorne, FNP  ondansetron (ZOFRAN) 4 MG tablet Take 1 tablet by mouth every 8 (eight) hours as needed.    [provider]  promethazine (PHENERGAN) 12.5 MG tablet Take 1-2 tablets (12.5-25 mg total) by mouth once as needed for up to 1 dose for nausea or vomiting. With migraines. Patient not taking: Reported on 09/15/2022 10/30/17   McLean-Scocuzza, Nino Glow, MD  promethazine (PHENERGAN) 12.5 MG tablet Take 1 tablet by mouth every 6 (six) hours as needed. 02/26/22   [provider]  rizatriptan (MAXALT) 10 MG tablet Take 1 tablet (10 mg total) by mouth as needed for migraine. May repeat in 2 hours if needed. Max dose 3 pills in 24 hours 07/25/17   McLean-Scocuzza, Nino Glow, MD  sertraline (ZOLOFT) 100 MG tablet Take 100 mg by mouth daily. Patient not taking: Reported on 05/23/2022 11/26/16   [provider]  sertraline (ZOLOFT) 100 MG tablet Take 1 tablet by mouth daily.    [provider]  SUMAtriptan (IMITREX) 100 MG tablet Take by mouth as needed. 05/04/21   [provider]  UBRELVY 100 MG TABS Take 1 tablet by mouth daily as needed.    [provider]  Vitamin D, Ergocalciferol, (DRISDOL) 1.25 MG (50000 UNIT) CAPS capsule Take 1 capsule (50,000 Units  total) by mouth every 7 (seven) days. 08/11/21   Bo Merino, MD  zonisamide (ZONEGRAN) 100 MG capsule take 2 tabs at hs 12/18/16   [provider]    Family History Family History  Problem Relation Age of Onset   Healthy Mother    Hypertension Father    Cancer Maternal Grandmother        cancer, breast   Cancer Paternal Grandmother        breast   Healthy Sister    Hypertension Paternal [employer]chant [employer] officer    Healthy Daughter    Healthy Son  Social History Social History   Tobacco Use   Smoking status: Never   Smokeless tobacco: Never  Vaping Use   Vaping Use: Never used  Substance Use Topics   Alcohol use: No    Alcohol/week: 0.0 standard drinks of alcohol   Drug use: Never     Allergies   Patient has no known allergies.   Review of Systems Review of Systems  Constitutional:  Negative for chills and fever.  HENT:  Positive for congestion, ear pain, postnasal drip, rhinorrhea, sinus pressure and sore throat.   Respiratory:  Positive for cough. Negative for shortness of breath.   Cardiovascular:  Negative for chest pain and palpitations.  Gastrointestinal:  Negative for diarrhea and vomiting.  Skin:  Negative for color change and rash.  All other systems reviewed and are negative.    Physical Exam Triage Vital Signs ED Triage Vitals  Enc Vitals Group     BP      Pulse      Resp      Temp      Temp src      SpO2      Weight      Height      Head Circumference      Peak Flow      Pain Score      Pain Loc      Pain Edu?      Excl. in [place]?    No data found.  Updated Vital Signs BP 122/85   Pulse (!) 113   Temp 98.9 F (37.2 C)   Resp 18   SpO2 98%   Visual Acuity Right Eye Distance:   Left Eye Distance:   Bilateral Distance:    Right Eye Near:   Left Eye Near:    Bilateral Near:     Physical Exam Vitals and nursing note reviewed.  Constitutional:      General: She is not in acute distress.    Appearance: Normal appearance.  She is well-developed. She is not ill-appearing.  HENT:     Right Ear: Tympanic membrane normal.     Left Ear: Tympanic membrane is erythematous.     Nose: Congestion and rhinorrhea present.     Mouth/Throat:     Mouth: Mucous membranes are moist.     Pharynx: Posterior oropharyngeal erythema present.  Cardiovascular:     Rate and Rhythm: Normal rate and regular rhythm.     Heart sounds: Normal heart sounds.  Pulmonary:     Effort: Pulmonary effort is normal. No respiratory distress.     Breath sounds: Normal breath sounds.  Musculoskeletal:     Cervical back: Neck supple.  Skin:    General: Skin is warm and dry.  Neurological:     Mental Status: She is alert.  Psychiatric:        Mood and Affect: Mood normal.        Behavior: Behavior normal.      UC Treatments / Results  Labs (all labs ordered are listed, but only abnormal results are displayed) Labs Reviewed - No data to display  EKG   Radiology No results found.  Procedures Procedures (including critical care time)  Medications Ordered in UC Medications - No data to display  Initial Impression / Assessment and Plan / UC Course  I have reviewed the triage vital signs and the nursing notes.  Pertinent labs & imaging results that were available during my care of the patient were reviewed by  me and considered in my medical decision making (see chart for details).    Left otitis media, acute sinusitis, cough.  Treating with amoxicillin.   Discussed symptomatic treatment including Tylenol, rest, hydration.  Instructed patient to follow up with her PCP if her symptoms are not improving.  She agrees to plan of care.   Final Clinical Impressions(s) / UC Diagnoses   Final diagnoses:  Left otitis media, unspecified otitis media type  Acute non-recurrent maxillary sinusitis  Acute cough     Discharge Instructions      Take the amoxicillin as directed.    Take Tylenol as needed for fever or discomfort.       Follow-up with your primary care provider if your symptoms are not improving.         ED Prescriptions     Medication Sig Dispense Auth. Provider   amoxicillin (AMOXIL) 875 MG tablet Take 1 tablet (875 mg total) by mouth 2 (two) times daily for 10 days. 20 tablet Sharion Balloon, NP      PDMP not reviewed this encounter.   Sharion Balloon, NP 09/15/22 1058

## 2022-09-15 NOTE — Discharge Instructions (Addendum)
Take the amoxicillin as directed.    Take Tylenol as needed for fever or discomfort.      Follow-up with your primary care provider if your symptoms are not improving.

## 2022-09-15 NOTE — ED Triage Notes (Signed)
Patient to Urgent Care with complaints of cough/ nasal congestion/ left sided ear pain/ sinus pressure. Reports pain in her chest when coughing. Headaches/ sinus pain that radiates into teeth.  Reports symptoms started three days ago. Taking tylenol.

## 2023-02-03 ENCOUNTER — Telehealth: Payer: BC Managed Care – PPO | Admitting: Family Medicine

## 2023-02-03 DIAGNOSIS — R21 Rash and other nonspecific skin eruption: Secondary | ICD-10-CM

## 2023-02-03 NOTE — Progress Notes (Signed)
E Visit for Rash  We are sorry that you are not feeling well. Here is how we plan to help!   This could be molluscum. It is more common in children but can occur in adults. I would recommend you treat symptoms and allow to run its course which can last 2-6 weeks. See further instructions below. F/U with urgent care or your pcp if symptoms worsen so they can visualize rash better in person.    HOME CARE:  Take cool showers and avoid direct sunlight. Apply cool compress or wet dressings. Take a bath in an oatmeal bath.  Sprinkle content of one Aveeno packet under running faucet with comfortably warm water.  Bathe for 15-20 minutes, 1-2 times daily.  Pat dry with a towel. Do not rub the rash. Use hydrocortisone cream. Take an antihistamine like Benadryl for widespread rashes that itch.  The adult dose of Benadryl is 25-50 mg by mouth 4 times daily. Caution:  This type of medication may cause sleepiness.  Do not drink alcohol, drive, or operate dangerous machinery while taking antihistamines.  Do not take these medications if you have prostate enlargement.  Read package instructions thoroughly on all medications that you take.  GET HELP RIGHT AWAY IF:  Symptoms don't go away after treatment. Severe itching that persists. If you rash spreads or swells. If you rash begins to smell. If it blisters and opens or develops a yellow-brown crust. You develop a fever. You have a sore throat. You become short of breath.  MAKE SURE YOU:  Understand these instructions. Will watch your condition. Will get help right away if you are not doing well or get worse.  Thank you for choosing an e-visit.  Your e-visit answers were reviewed by a board certified advanced clinical practitioner to complete your personal care plan. Depending upon the condition, your plan could have included both over the counter or prescription medications.  Please review your pharmacy choice. Make sure the pharmacy is open so  you can pick up prescription now. If there is a problem, you may contact your provider through [employer] and have the prescription routed to another pharmacy.  Your safety is important to [place]. If you have drug allergies check your prescription carefully.   For the next 24 hours you can use MyChart to ask questions about today's visit, request a non-urgent call back, or ask for a work or school excuse. You will get an email in the next two days asking about your experience. I hope that your e-visit has been valuable and will speed your recovery.

## 2023-02-04 ENCOUNTER — Ambulatory Visit: Payer: BC Managed Care – PPO | Admitting: Family

## 2023-02-13 ENCOUNTER — Ambulatory Visit: Payer: BC Managed Care – PPO | Admitting: Family

## 2023-02-19 ENCOUNTER — Ambulatory Visit: Payer: BC Managed Care – PPO | Admitting: Family

## 2023-02-19 ENCOUNTER — Encounter: Payer: Self-pay | Admitting: Family

## 2023-02-19 VITALS — BP 124/76 | HR 90 | Temp 97.9°F | Ht 63.0 in | Wt 142.0 lb

## 2023-02-19 DIAGNOSIS — E559 Vitamin D deficiency, unspecified: Secondary | ICD-10-CM | POA: Diagnosis not present

## 2023-02-19 DIAGNOSIS — E538 Deficiency of other specified B group vitamins: Secondary | ICD-10-CM | POA: Diagnosis not present

## 2023-02-19 DIAGNOSIS — J0100 Acute maxillary sinusitis, unspecified: Secondary | ICD-10-CM

## 2023-02-19 MED ORDER — AMOXICILLIN-POT CLAVULANATE 875-125 MG PO TABS: 1.0000 | ORAL_TABLET | Freq: Two times a day (BID) | ORAL | 0 refills | Status: AC

## 2023-02-19 NOTE — Patient Instructions (Addendum)
Please contact Dr Vickey Sages regarding mammogram, you are over due.  Start over the counter b12 daily., Vit d 1000 international units  daily   Start Augmentin, antibiotic  Ensure to take probiotics while on antibiotics and also for 2 weeks after completion. This can either be by eating yogurt daily or taking a probiotic supplement over the counter such as Culturelle.It is important to re-colonize the gut with good bacteria and also to prevent any diarrheal infections associated with antibiotic use.    Nice to see you!

## 2023-02-19 NOTE — Progress Notes (Signed)
Assessment & Plan:  Acute non-recurrent maxillary sinusitis Assessment & Plan: Duration 10 days.  Failed conservative therapy Mucinex, antihistamine.  Start Augmentin with probiotics.  She will let me know how she is doing.   Orders: -     Amoxicillin-Pot Clavulanate; Take 1 tablet by mouth 2 (two) times daily for 7 days.  Dispense: 14 tablet; Refill: 0  Vitamin D deficiency  B12 deficiency Assessment & Plan: Discussed optimal B12 greater than 400.  Start over-the-counter B12 1000 mcg daily.  Pending celiac, intrinsic factor lab  Orders: -     Intrinsic Factor Antibodies -     Homocysteine -     Methylmalonic acid, serum -     Celiac Disease Ab Screen w/Rfx     Return precautions given.   Risks, benefits, and alternatives of the medications and treatment plan prescribed today were discussed, and patient expressed understanding.   Education regarding symptom management and diagnosis given to patient on AVS either electronically or printed.  Return in about 1 year (around 02/19/2024) for Complete Physical Exam.  Rennie Plowman, FNP  Subjective:    Patient ID: Alejandra Cook, female    DOB: 07/22/1981, 42 y.o.   MRN: [id number]  CC: Alejandra Cook is a 42 y.o. female who presents today for follow up.   HPI: Complains of nasal congestion x 10 days She is using mucinex-D,  zyrtec without relief Endorses feeling of ear drainage in left ear, sore throat in morning  Dry cough, not persistent No sob, cp, fever.   She notes her daughter has been sick at home     Following with neurology for migraine. Last seen 11/16/22 She remains compliant with Emgality monthly injections , Zonisamide/Zonegran to 400 mg daily .  Compliant with gabapentin 600 mg at bedtime for myofascial pain and occipital neuralgia   Allergies: Patient has no known allergies. Current Outpatient Medications on File Prior to Visit  Medication Sig Dispense Refill   buPROPion (WELLBUTRIN XL) 300 MG  24 hr tablet TAKE 1 TABLET BY MOUTH EVERY DAY IN THE MORNING 90 tablet 3   butalbital-acetaminophen-caffeine (FIORICET) 50-325-40 MG tablet Take 1 tablet by mouth as needed.     EMGALITY 120 MG/ML SOAJ Inject into the skin.     gabapentin (NEURONTIN) 300 MG capsule Take 600 mg by mouth at bedtime.     LO LOESTRIN FE 1 MG-10 MCG / 10 MCG tablet Take 1 tablet by mouth daily. as directed  9   ondansetron (ZOFRAN) 4 MG tablet TAKE 1 TABLET (4 MG TOTAL) BY MOUTH DAILY AS NEEDED FOR NAUSEA OR VOMITING. 18 tablet 1   ondansetron (ZOFRAN) 4 MG tablet Take 1 tablet by mouth every 8 (eight) hours as needed.     promethazine (PHENERGAN) 12.5 MG tablet Take 1 tablet by mouth every 6 (six) hours as needed.     rizatriptan (MAXALT) 10 MG tablet Take 1 tablet (10 mg total) by mouth as needed for migraine. May repeat in 2 hours if needed. Max dose 3 pills in 24 hours 10 tablet 1   sertraline (ZOLOFT) 100 MG tablet Take 100 mg by mouth daily.  2   sertraline (ZOLOFT) 100 MG tablet Take 1 tablet by mouth daily.     SUMAtriptan (IMITREX) 100 MG tablet Take by mouth as needed.     UBRELVY 100 MG TABS Take 1 tablet by mouth daily as needed.     Vitamin D, Ergocalciferol, (DRISDOL) 1.25 MG (50000 UNIT) CAPS capsule Take  1 capsule (50,000 Units total) by mouth every 7 (seven) days. 12 capsule 0   zonisamide (ZONEGRAN) 100 MG capsule take 2 tabs at hs  2   cyclobenzaprine (FLEXERIL) 5 MG tablet Take 1 tablet (5 mg total) by mouth 3 (three) times daily as needed for muscle spasms. (Patient not taking: Reported on 09/15/2022) 30 tablet 1   methocarbamol (ROBAXIN) 500 MG tablet Take 1 tablet 3 times a day by oral route as needed. (Patient not taking: Reported on 09/15/2022)     promethazine (PHENERGAN) 12.5 MG tablet Take 1-2 tablets (12.5-25 mg total) by mouth once as needed for up to 1 dose for nausea or vomiting. With migraines. (Patient not taking: Reported on 09/15/2022) 20 tablet 2   No current facility-administered  medications on file prior to visit.    Review of Systems  Constitutional:  Negative for chills and fever.  HENT:  Positive for congestion, sinus pain and sore throat.   Respiratory:  Positive for cough.   Cardiovascular:  Negative for chest pain and palpitations.  Gastrointestinal:  Negative for nausea and vomiting.      Objective:    BP 124/76   Pulse 90   Temp 97.9 F (36.6 C)   Ht 5\' 3"  (1.6 m)   Wt 142 lb (64.4 kg)   SpO2 99%   BMI 25.15 kg/m  BP Readings from Last 3 Encounters:  02/19/23 124/76  09/15/22 122/85  05/24/22 116/83   Wt Readings from Last 3 Encounters:  02/19/23 142 lb (64.4 kg)  05/24/22 150 lb 6.4 oz (68.2 kg)  01/18/22 143 lb 6.4 oz (65 kg)    Physical Exam Vitals reviewed.  Constitutional:      Appearance: She is well-developed.  HENT:     Head: Normocephalic and atraumatic.     Right Ear: Hearing, tympanic membrane, ear canal and external ear normal. No decreased hearing noted. No drainage, swelling or tenderness. No middle ear effusion. No foreign body. Tympanic membrane is not erythematous or bulging.     Left Ear: Hearing, tympanic membrane, ear canal and external ear normal. No decreased hearing noted. No drainage, swelling or tenderness.  No middle ear effusion. No foreign body. Tympanic membrane is not erythematous or bulging.     Nose: No rhinorrhea.     Right Sinus: No maxillary sinus tenderness or frontal sinus tenderness.     Left Sinus: Frontal sinus tenderness present. No maxillary sinus tenderness.     Mouth/Throat:     Pharynx: Uvula midline. Posterior oropharyngeal erythema present. No oropharyngeal exudate.     Tonsils: No tonsillar abscesses.  Eyes:     Conjunctiva/sclera: Conjunctivae normal.  Cardiovascular:     Rate and Rhythm: Regular rhythm.     Pulses: Normal pulses.     Heart sounds: Normal heart sounds.  Pulmonary:     Effort: Pulmonary effort is normal.     Breath sounds: Normal breath sounds. No wheezing, rhonchi  or rales.  Lymphadenopathy:     Head:     Right side of head: No submental, submandibular, tonsillar, preauricular, posterior auricular or occipital adenopathy.     Left side of head: No submental, submandibular, tonsillar, preauricular, posterior auricular or occipital adenopathy.     Cervical: No cervical adenopathy.  Skin:    General: Skin is warm and dry.  Neurological:     Mental Status: She is alert.  Psychiatric:        Speech: Speech normal.  Behavior: Behavior normal.        Thought Content: Thought content normal.

## 2023-02-19 NOTE — Assessment & Plan Note (Signed)
Duration 10 days.  Failed conservative therapy Mucinex, antihistamine.  Start Augmentin with probiotics.  She will let me know how she is doing.

## 2023-02-19 NOTE — Assessment & Plan Note (Signed)
Discussed optimal B12 greater than 400.  Start over-the-counter B12 1000 mcg daily.  Pending celiac, intrinsic factor lab

## 2023-02-20 LAB — CELIAC DISEASE AB SCREEN W/RFX
Antigliadin Abs, IgA: 3 units (ref 0–19)
IgA/Immunoglobulin A, Serum: 97 mg/dL (ref 87–352)
Transglutaminase IgA: <2 U/mL (ref 0–3)

## 2023-02-21 LAB — METHYLMALONIC ACID, SERUM: Methylmalonic Acid, Quant: 362 nmol/L — ABNORMAL HIGH (ref 55–335)

## 2023-02-23 LAB — HOMOCYSTEINE: Homocysteine: 12.1 umol/L — ABNORMAL HIGH (ref <10.4)

## 2023-02-23 LAB — INTRINSIC FACTOR ANTIBODIES: Intrinsic Factor: NEGATIVE

## 2023-04-19 ENCOUNTER — Telehealth: Payer: BC Managed Care – PPO | Admitting: Family Medicine

## 2023-04-19 DIAGNOSIS — B9789 Other viral agents as the cause of diseases classified elsewhere: Secondary | ICD-10-CM

## 2023-04-19 DIAGNOSIS — J019 Acute sinusitis, unspecified: Secondary | ICD-10-CM

## 2023-04-19 MED ORDER — PROMETHAZINE-DM 6.25-15 MG/5ML PO SYRP: 5.0000 mL | ORAL_SOLUTION | Freq: Four times a day (QID) | ORAL | 0 refills | Status: DC | PRN

## 2023-04-19 MED ORDER — NAPROXEN 500 MG PO TABS: 500.0000 mg | ORAL_TABLET | Freq: Two times a day (BID) | ORAL | 0 refills | Status: DC

## 2023-04-19 MED ORDER — FLUTICASONE PROPIONATE 50 MCG/ACT NA SUSP: 2.0000 | Freq: Every day | NASAL | 0 refills | Status: DC

## 2023-04-19 NOTE — Progress Notes (Signed)
E-Visit for [employer] Virus / COVID Screening  Your current symptoms could be consistent with COVID.  Please complete a Covid test either at home or check with your local pharmacy to see if they provide testing.    You have tested positive for COVID-19, meaning that you were infected with the novel coronavirus and could give the virus to others.  Most people with COVID-19 have mild illness and can recover at home without medical care. Do not leave your home, except to get medical care. Do not visit public areas and do not go to places where you are unable to wear a mask. It is important that you stay home  to take care for yourself and to help protect other people in your home and community.      Isolation Instructions:   You are to isolate at home until you have been fever free for at least 24 hours without a fever-reducing medication, and symptoms have been steadily improving for 24 hours. At that time,  you can end isolation but need to mask for an additional 5 days.  If you must be around other household members who do not have symptoms, you need to make sure that both you and the family members are masking consistently with a high-quality mask.  If you note any worsening of symptoms despite treatment, please seek an in-person evaluation ASAP. If you note any significant shortness of breath or any chest pain, please seek ER evaluation. Please do not delay care!  Go to the nearest hospital ED for assessment if fever/cough/breathlessness are severe or illness seems like a threat to life.    The following symptoms may appear 2-14 days after exposure: Fever Cough Shortness of breath or difficulty breathing Chills Repeated shaking with chills Muscle pain Headache Sore throat New loss of taste or smell Fatigue Congestion or runny nose Nausea or vomiting Diarrhea  You can use medication such as prescription cough medication called Phenergan DM 6.25 mg/15 mg. You make take one teaspoon / 5 ml  every 4-6 hours as needed for cough, prescription anti-inflammatory called Naprosyn 500 mg. Take twice daily as needed for fever or body aches for 2 weeks, and prescription for Fluticasone nasal spray 2 sprays in each nostril one time per day  You may also take acetaminophen (Tylenol) as needed for fever.  HOME CARE Only take medications as instructed by your medical team. Drink plenty of fluids and get plenty of rest. A steam or ultrasonic humidifier can help if you have congestion.  GET HELP RIGHT AWAY IF YOU HAVE EMERGENCY WARNING SIGNS.  Call 911 or proceed to your closest emergency facility if: You develop worsening high fever. Trouble breathing Bluish lips or face Persistent pain or pressure in the chest New confusion Inability to wake or stay awake You cough up blood. Your symptoms become more severe Inability to hold down food or fluids  This list is not all possible symptoms. Contact your medical provider for any symptoms that are severe or concerning to you.   Your e-visit answers were reviewed by a board certified advanced clinical practitioner to complete your personal care plan.  Depending on the condition, your plan could have included both over the counter or prescription medications.  If there is a problem, please reply once you have received a response from your provider.  Your safety is important to [place].  If you have drug allergies check your prescription carefully.    You can use MyChart to ask questions about  today's visit, request a non-urgent call back, or ask for a work or school excuse for 24 hours related to this e-Visit. If it has been greater than 24 hours you will need to follow up with your provider or enter a new e-Visit to address those concerns. You will get an e-mail in the next two days asking about your experience.  I hope that your e-visit has been valuable and will speed your recovery. Thank you for using e-visits.    I have spent 5 minutes in  review of e-visit questionnaire, review and updating patient chart, medical decision making and response to patient.   Margaretann Loveless, PA-C

## 2023-07-01 ENCOUNTER — Ambulatory Visit
Admission: RE | Admit: 2023-07-01 | Discharge: 2023-07-01 | Disposition: A | Discharge status: Home / Self Care | Payer: BC Managed Care – PPO | Source: Ambulatory Visit | Attending: Family Medicine | Admitting: Family Medicine

## 2023-07-01 VITALS — BP 130/84 | HR 91 | Temp 98.6°F | Resp 18

## 2023-07-01 DIAGNOSIS — J0111 Acute recurrent frontal sinusitis: Secondary | ICD-10-CM | POA: Diagnosis not present

## 2023-07-01 DIAGNOSIS — R051 Acute cough: Secondary | ICD-10-CM

## 2023-07-01 MED ORDER — PREDNISONE 20 MG PO TABS: 20.0000 mg | ORAL_TABLET | Freq: Every day | ORAL | 0 refills | Status: AC

## 2023-07-01 MED ORDER — AMOXICILLIN 875 MG PO TABS: 875.0000 mg | ORAL_TABLET | Freq: Two times a day (BID) | ORAL | 0 refills | Status: DC

## 2023-07-01 NOTE — Discharge Instructions (Signed)
Return if symptoms have not significantly improved or resolved following completion of medication.

## 2023-07-01 NOTE — ED Provider Notes (Signed)
[name]    CSN: [id number] Arrival date & time: 07/01/23  1355      History   Chief Complaint Chief Complaint  Patient presents with  . Nasal Congestion    Cough, sinus pressure, chest congestion - Entered by patient    HPI Alejandra Cook is a 42 y.o. female.   HPI SINUSITIS Patient presents with concern for possible sinus infection. Onset: * days. This is a recurrent problem that patient reports occurring at least 1-2 times per year. *Endorse facial pressure, frontal headache, ear pressure, copious mucus. *Denies fever, ST, chest tightness, eye irritation,  or GI symptoms. Attempted relief with OTC medication without any relief of symptoms. *Patient is a smoker/nonsmoker. Severity: moderate History of *allergies, *asthma, or *COPD   Remainder of Review of Systems negative except as noted in the HPI.    Past Medical History:  Diagnosis Date  . Allergy    Dr. Gwen Pounds, gold and nickel allergy  . Anxiety   . Bulging discs   . Degenerative disc disease, lumbar   . Depression    off of zoloft for 1 year, had problems with depression and anxiety after last child  . Fibromyalgia    after pregnancy, Followed by [facility]  . Fibromyalgia   . QVZDGLOV(564.3)    Dr. Neale Burly at [facility]; migraines since elementary   . IBS (irritable bowel syndrome)   . 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 Spontaneous abortion in second trimester Dec 2014    Patient Active Problem List   Diagnosis Date Noted  . B12 deficiency 02/19/2023  . Gastric erosion   . Gastric erythema   . Gallbladder polyp 12/29/2021  . Conjunctivitis 11/02/2021  . Shingles 02/27/2021  . Sinus tachycardia 07/21/2020  . Heart palpitations 06/26/2020  . Otitis media 06/02/2020  . Low back pain 02/23/2020  . Amenorrhea 01/25/2020  . Depression, major, single episode, mild (HCC) 11/23/2019  . Irritable bowel syndrome 09/10/2019  . Intractable  migraine with aura without status migrainosus 11/07/2018  . Contusion of left hand 09/09/2018  . Polycythemia 12/01/2017  . History of migraine 12/20/2016  . Other fatigue 12/20/2016  . Primary insomnia 12/20/2016  . History of IBS 12/20/2016  . History of renal calculi 12/20/2016  . History of sinusitis 12/20/2016  . Bug bites 01/23/2016  . Bronchitis 08/11/2014  . Routine general medical examination at a health care facility 01/21/2013  . Fibromyalgia 01/17/2012  . Allergic rhinitis 01/17/2012  . Sinusitis 04/20/2010  . Migraine headache 05/12/2007  . DDD (degenerative disc disease), lumbosacral 05/12/2007    Past Surgical History:  Procedure Laterality Date  . COLONOSCOPY     normal  . COLPOSCOPY  2002  . CYSTOSCOPY     ureteral stent inserted  . DILATION AND EVACUATION N/A 08/07/2013   Procedure: DILATATION AND EVACUATION;  Surgeon: Zelphia Cairo, MD;  Location: [facility];  Service: Gynecology;  Laterality: N/A;  . ESOPHAGOGASTRODUODENOSCOPY (EGD) WITH PROPOFOL N/A 05/24/2022   Procedure: ESOPHAGOGASTRODUODENOSCOPY (EGD) WITH PROPOFOL;  Surgeon: Toney Reil, MD;  Location: [facility];  Service: Endoscopy;  Laterality: N/A;  . LAPAROSCOPY N/A 03/20/2013   Procedure: LAPAROSCOPY OPERATIVE  IUD REMOVAL;  Surgeon: Zelphia Cairo, MD;  Location: [facility];  Service: Gynecology;  Laterality: N/A;  Intraabdominal from [place]  . NASAL SINUS SURGERY    . PILONIDAL CYST EXCISION    . VAGINAL DELIVERY  2011    OB History     Gravida  4   Para  2   Term  1   Preterm  1   AB  2   Living  2      SAB  2   IAB      Ectopic      Multiple  0   Live Births  2            Home Medications    Prior to Admission medications   Medication Sig Start Date End Date Taking? Authorizing Provider  buPROPion (WELLBUTRIN XL) 300 MG 24 hr tablet TAKE 1 TABLET BY MOUTH EVERY DAY IN THE MORNING 07/03/22   Allegra Grana, FNP  butalbital-acetaminophen-caffeine  (FIORICET) 50-325-40 MG tablet Take 1 tablet by mouth as needed. Patient not taking: Reported on 07/01/2023 08/04/19   [provider]  cyclobenzaprine (FLEXERIL) 5 MG tablet Take 1 tablet (5 mg total) by mouth 3 (three) times daily as needed for muscle spasms. Patient not taking: Reported on 09/15/2022 02/04/19   Tracey Harries, FNP  EMGALITY 120 MG/ML SOAJ Inject into the skin. 07/03/21   [provider]  fluticasone (FLONASE) 50 MCG/ACT nasal spray Place 2 sprays into both nostrils daily. 04/19/23   Margaretann Loveless, PA-C  gabapentin (NEURONTIN) 300 MG capsule Take 600 mg by mouth at bedtime. 01/09/22   [provider]  LO LOESTRIN FE 1 MG-10 MCG / 10 MCG tablet Take 1 tablet by mouth daily. as directed 12/19/16   [provider]  methocarbamol (ROBAXIN) 500 MG tablet Take 1 tablet 3 times a day by oral route as needed. Patient not taking: Reported on 09/15/2022    [provider]  naproxen (NAPROSYN) 500 MG tablet Take 1 tablet (500 mg total) by mouth 2 (two) times daily with a meal. Patient not taking: Reported on 07/01/2023 04/19/23   Margaretann Loveless, PA-C  ondansetron (ZOFRAN) 4 MG tablet TAKE 1 TABLET (4 MG TOTAL) BY MOUTH DAILY AS NEEDED FOR NAUSEA OR VOMITING. 02/15/18   Allegra Grana, FNP  ondansetron (ZOFRAN) 4 MG tablet Take 1 tablet by mouth every 8 (eight) hours as needed. Patient not taking: Reported on 07/01/2023    [provider]  promethazine (PHENERGAN) 12.5 MG tablet Take 1-2 tablets (12.5-25 mg total) by mouth once as needed for up to 1 dose for nausea or vomiting. With migraines. 10/30/17   McLean-Scocuzza, Pasty Spillers, MD  promethazine (PHENERGAN) 12.5 MG tablet Take 1 tablet by mouth every 6 (six) hours as needed. Patient not taking: Reported on 07/01/2023 02/26/22   [provider]  promethazine-dextromethorphan (PROMETHAZINE-DM) 6.25-15 MG/5ML syrup Take 5 mLs by mouth 4 (four) times daily as needed. Patient  not taking: Reported on 07/01/2023 04/19/23   Margaretann Loveless, PA-C  rizatriptan (MAXALT) 10 MG tablet Take 1 tablet (10 mg total) by mouth as needed for migraine. May repeat in 2 hours if needed. Max dose 3 pills in 24 hours 07/25/17   McLean-Scocuzza, Pasty Spillers, MD  sertraline (ZOLOFT) 100 MG tablet Take 100 mg by mouth daily. 11/26/16   [provider]  sertraline (ZOLOFT) 100 MG tablet Take 1 tablet by mouth daily.    [provider]  SUMAtriptan (IMITREX) 100 MG tablet Take by mouth as needed. 05/04/21   [provider]  UBRELVY 100 MG TABS Take 1 tablet by mouth daily as needed.    [provider]  Vitamin D, Ergocalciferol, (DRISDOL) 1.25 MG (50000 UNIT) CAPS capsule Take 1 capsule (50,000 Units total)  by mouth every 7 (seven) days. Patient not taking: Reported on 07/01/2023 08/11/21   Pollyann Savoy, MD  zonisamide (ZONEGRAN) 100 MG capsule take 2 tabs at hs 12/18/16   [provider]    Family History Family History  Problem Relation Age of Onset  . Healthy Mother   . Hypertension Father   . Cancer Maternal Grandmother        cancer, breast  . Cancer Paternal Grandmother        breast  . Healthy Sister   . Hypertension Paternal Grandfather   . Healthy Daughter   . Healthy Son     Social History Social History   Tobacco Use  . Smoking status: Never  . Smokeless tobacco: Never  Vaping Use  . Vaping status: Never Used  Substance Use Topics  . Alcohol use: No    Alcohol/week: 0.0 standard drinks of alcohol  . Drug use: Never     Allergies   Patient has no known allergies.   Review of Systems Review of Systems   Physical Exam Triage Vital Signs ED Triage Vitals  Encounter Vitals Group     BP 07/01/23 1403 130/84     Systolic BP Percentile --      Diastolic BP Percentile --      Pulse Rate 07/01/23 1403 91     Resp 07/01/23 1403 18     Temp 07/01/23 1403 98.6 F (37 C)     Temp src --      SpO2 07/01/23 1403  99 %     Weight --      Height --      Head Circumference --      Peak Flow --      Pain Score 07/01/23 1416 4     Pain Loc --      Pain Education --      Exclude from Growth Chart --    No data found.  Updated Vital Signs BP 130/84   Pulse 91   Temp 98.6 F (37 C)   Resp 18   SpO2 99%   Visual Acuity Right Eye Distance:   Left Eye Distance:   Bilateral Distance:    Right Eye Near:   Left Eye Near:    Bilateral Near:     Physical Exam   UC Treatments / Results  Labs (all labs ordered are listed, but only abnormal results are displayed) Labs Reviewed - No data to display  EKG   Radiology No results found.  Procedures Procedures (including critical care time)  Medications Ordered in UC Medications - No data to display  Initial Impression / Assessment and Plan / UC Course  I have reviewed the triage vital signs and the nursing notes.  Pertinent labs & imaging results that were available during my care of the patient were reviewed by me and considered in my medical decision making (see chart for details).     *** Final Clinical Impressions(s) / UC Diagnoses   Final diagnoses:  None   Discharge Instructions   None    ED Prescriptions   None    PDMP not reviewed this encounter.

## 2023-07-01 NOTE — ED Triage Notes (Signed)
Patient to Urgent Care with complaints of cough and chest congestion/ nasal congestion/ sinus pressure. Denies any known fevers.  Reports symptoms started four days ago. 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   Taking Mucinex.

## 2023-07-10 ENCOUNTER — Telehealth: Payer: BC Managed Care – PPO | Admitting: Physician Assistant

## 2023-07-10 DIAGNOSIS — R3989 Other symptoms and signs involving the genitourinary system: Secondary | ICD-10-CM

## 2023-07-10 MED ORDER — NITROFURANTOIN MONOHYD MACRO 100 MG PO CAPS: 100.0000 mg | ORAL_CAPSULE | Freq: Two times a day (BID) | ORAL | 0 refills | Status: DC

## 2023-07-10 NOTE — Progress Notes (Signed)

## 2023-08-20 ENCOUNTER — Other Ambulatory Visit: Payer: Self-pay | Admitting: Family

## 2023-08-20 DIAGNOSIS — F320 Major depressive disorder, single episode, mild: Secondary | ICD-10-CM

## 2023-10-01 ENCOUNTER — Other Ambulatory Visit: Payer: Self-pay

## 2023-10-01 ENCOUNTER — Ambulatory Visit
Admission: RE | Admit: 2023-10-01 | Discharge: 2023-10-01 | Disposition: A | Discharge status: Home / Self Care | Payer: Self-pay | Source: Ambulatory Visit | Attending: Emergency Medicine | Admitting: Emergency Medicine

## 2023-10-01 VITALS — BP 116/83 | HR 83 | Temp 97.8°F | Resp 16

## 2023-10-01 DIAGNOSIS — J0110 Acute frontal sinusitis, unspecified: Secondary | ICD-10-CM

## 2023-10-01 MED ORDER — AMOXICILLIN-POT CLAVULANATE 875-125 MG PO TABS: 1.0000 | ORAL_TABLET | Freq: Two times a day (BID) | ORAL | 0 refills | Status: AC

## 2023-10-01 MED ORDER — PREDNISONE 10 MG (21) PO TBPK: ORAL_TABLET | Freq: Every day | ORAL | 0 refills | Status: DC

## 2023-10-01 NOTE — ED Provider Notes (Signed)
 Alejandra Cook    CSN: [id number] Arrival date & time: 10/01/23  1333      History   Chief Complaint Chief Complaint  Patient presents with   Sore Throat    Sinus pressure, sore throat, cough- week and a half - Entered by patient   Facial Pain    HPI Alejandra Cook is a 43 y.o. female.   Patient presents for evaluation of nasal congestion, rhinorrhea, nonproductive cough, sinus pressure across the forehead, sore throat and intermittent headaches present for 10 days.  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  Tolerating food and liquids.  Has attempted use of Mucinex and Tylenol.  Past Medical History:  Diagnosis Date   Allergy    Dr. Gwen Pounds, gold and nickel allergy   Anxiety    Bulging discs    Degenerative disc disease, lumbar    Depression    off of zoloft for 1 year, had problems with depression and anxiety after last child   Fibromyalgia    after pregnancy, Followed by [facility]   Fibromyalgia    Headache(784.0)    Dr. Neale Burly at [facility]; migraines since elementary    IBS (irritable bowel syndrome)    Intrahepatic cholestasis of pregnancy in third trimester, antepartum    Spontaneous abortion in second trimester Dec 2014    Patient Active Problem List   Diagnosis Date Noted   B12 deficiency 02/19/2023   Gastric erosion    Gastric erythema    Gallbladder polyp 12/29/2021   Conjunctivitis 11/02/2021   Shingles 02/27/2021   Sinus tachycardia 07/21/2020   Heart palpitations 06/26/2020   Otitis media 06/02/2020   Low back pain 02/23/2020   Amenorrhea 01/25/2020   Depression, major, single episode, mild (HCC) 11/23/2019   Irritable bowel syndrome 09/10/2019   Intractable migraine with aura without status migrainosus 11/07/2018   Contusion of left hand 09/09/2018   Polycythemia 12/01/2017   History of migraine 12/20/2016   Other fatigue 12/20/2016   Primary insomnia 12/20/2016   History of IBS 12/20/2016   History of  renal calculi 12/20/2016   History of sinusitis 12/20/2016   Bug bites 01/23/2016   Bronchitis 08/11/2014   Routine general medical examination at a health care facility 01/21/2013   Fibromyalgia 01/17/2012   Allergic rhinitis 01/17/2012   Sinusitis 04/20/2010   Migraine headache 05/12/2007   DDD (degenerative disc disease), lumbosacral 05/12/2007    Past Surgical History:  Procedure Laterality Date   COLONOSCOPY     normal   COLPOSCOPY  2002   CYSTOSCOPY     ureteral stent inserted   DILATION AND EVACUATION N/A 08/07/2013   Procedure: DILATATION AND EVACUATION;  Surgeon: Zelphia Cairo, MD;  Location: [facility];  Service: Gynecology;  Laterality: N/A;   ESOPHAGOGASTRODUODENOSCOPY (EGD) WITH PROPOFOL N/A 05/24/2022   Procedure: ESOPHAGOGASTRODUODENOSCOPY (EGD) WITH PROPOFOL;  Surgeon: Toney Reil, MD;  Location: [facility];  Service: Endoscopy;  Laterality: N/A;   LAPAROSCOPY N/A 03/20/2013   Procedure: LAPAROSCOPY OPERATIVE  IUD REMOVAL;  Surgeon: Zelphia Cairo, MD;  Location: [facility];  Service: Gynecology;  Laterality: N/A;  Intraabdominal from [place]   NASAL SINUS SURGERY     PILONIDAL CYST EXCISION     VAGINAL DELIVERY  2011    OB History     Gravida  4   Para  2   Term  1   Preterm  1   AB  2   Living  2  SAB  2   IAB      Ectopic      Multiple  0   Live Births  2            Home Medications    Prior to Admission medications   Medication Sig Start Date End Date Taking? Authorizing Provider  amoxicillin-clavulanate (AUGMENTIN) 875-125 MG tablet Take 1 tablet by mouth every 12 (twelve) hours for 10 days. 10/01/23 10/11/23 Yes Karley Pho R, NP  predniSONE (STERAPRED UNI-PAK 21 TAB) 10 MG (21) TBPK tablet Take by mouth daily. Take 6 tabs by mouth daily  for 1 days, then 5 tabs for 1 days, then 4 tabs for 1 days, then 3 tabs for 1 days, 2 tabs for 1 days, then 1 tab by mouth daily for 1 days 10/01/23  Yes Bailey Faiella R, NP   amoxicillin (AMOXIL) 875 MG tablet Take 1 tablet (875 mg total) by mouth 2 (two) times daily. 07/01/23   Bing Neighbors, NP  buPROPion (WELLBUTRIN XL) 300 MG 24 hr tablet TAKE 1 TABLET BY MOUTH EVERY DAY IN THE MORNING 08/20/23   Allegra Grana, FNP  butalbital-acetaminophen-caffeine (FIORICET) 50-325-40 MG tablet Take 1 tablet by mouth as needed. Patient not taking: Reported on 07/01/2023 08/04/19   [provider]  cyclobenzaprine (FLEXERIL) 5 MG tablet Take 1 tablet (5 mg total) by mouth 3 (three) times daily as needed for muscle spasms. Patient not taking: Reported on 09/15/2022 02/04/19   Tracey Harries, FNP  EMGALITY 120 MG/ML SOAJ Inject into the skin. 07/03/21   [provider]  fluticasone (FLONASE) 50 MCG/ACT nasal spray Place 2 sprays into both nostrils daily. 04/19/23   Margaretann Loveless, PA-C  gabapentin (NEURONTIN) 300 MG capsule Take 600 mg by mouth at bedtime. 01/09/22   [provider]  LO LOESTRIN FE 1 MG-10 MCG / 10 MCG tablet Take 1 tablet by mouth daily. as directed 12/19/16   [provider]  methocarbamol (ROBAXIN) 500 MG tablet Take 1 tablet 3 times a day by oral route as needed. Patient not taking: Reported on 09/15/2022    [provider]  naproxen (NAPROSYN) 500 MG tablet Take 1 tablet (500 mg total) by mouth 2 (two) times daily with a meal. Patient not taking: Reported on 07/01/2023 04/19/23   Margaretann Loveless, PA-C  nitrofurantoin, macrocrystal-monohydrate, (MACROBID) 100 MG capsule Take 1 capsule (100 mg total) by mouth 2 (two) times daily. 07/10/23   Margaretann Loveless, PA-C  ondansetron (ZOFRAN) 4 MG tablet TAKE 1 TABLET (4 MG TOTAL) BY MOUTH DAILY AS NEEDED FOR NAUSEA OR VOMITING. 02/15/18   Allegra Grana, FNP  ondansetron (ZOFRAN) 4 MG tablet Take 1 tablet by mouth every 8 (eight) hours as needed. Patient not taking: Reported on 07/01/2023    [provider]  promethazine (PHENERGAN) 12.5 MG tablet  Take 1-2 tablets (12.5-25 mg total) by mouth once as needed for up to 1 dose for nausea or vomiting. With migraines. 10/30/17   McLean-Scocuzza, Pasty Spillers, MD  promethazine (PHENERGAN) 12.5 MG tablet Take 1 tablet by mouth every 6 (six) hours as needed. Patient not taking: Reported on 07/01/2023 02/26/22   [provider]  promethazine-dextromethorphan (PROMETHAZINE-DM) 6.25-15 MG/5ML syrup Take 5 mLs by mouth 4 (four) times daily as needed. Patient not taking: Reported on 07/01/2023 04/19/23   Margaretann Loveless, PA-C  rizatriptan (MAXALT) 10 MG tablet Take 1 tablet (10 mg total) by mouth as needed for migraine. May  repeat in 2 hours if needed. Max dose 3 pills in 24 hours 07/25/17   McLean-Scocuzza, Pasty Spillers, MD  sertraline (ZOLOFT) 100 MG tablet Take 100 mg by mouth daily. 11/26/16   [provider]  sertraline (ZOLOFT) 100 MG tablet Take 1 tablet by mouth daily.    [provider]  SUMAtriptan (IMITREX) 100 MG tablet Take by mouth as needed. 05/04/21   [provider]  UBRELVY 100 MG TABS Take 1 tablet by mouth daily as needed.    [provider]  Vitamin D, Ergocalciferol, (DRISDOL) 1.25 MG (50000 UNIT) CAPS capsule Take 1 capsule (50,000 Units total) by mouth every 7 (seven) days. Patient not taking: Reported on 07/01/2023 08/11/21   Pollyann Savoy, MD  zonisamide (ZONEGRAN) 100 MG capsule take 2 tabs at hs 12/18/16   [provider]    Family History Family History  Problem Relation Age of Onset   Healthy Mother    Hypertension Father    Cancer Maternal Grandmother        cancer, breast   Cancer Paternal Grandmother        breast   Healthy Sister    Hypertension Paternal Grandfather    Healthy Daughter    Healthy Son     Social History Social History   Tobacco Use   Smoking status: Never   Smokeless tobacco: Never  Vaping Use   Vaping status: Never Used  Substance Use Topics   Alcohol use: No    Alcohol/week: 0.0 standard  drinks of alcohol   Drug use: Never     Allergies   Patient has no known allergies.   Review of Systems Review of Systems   Physical Exam Triage Vital Signs ED Triage Vitals  Encounter Vitals Group     BP 10/01/23 1402 116/83     Systolic BP Percentile --      Diastolic BP Percentile --      Pulse Rate 10/01/23 1402 83     Resp 10/01/23 1402 16     Temp 10/01/23 1402 97.8 F (36.6 C)     Temp Source 10/01/23 1402 Temporal     SpO2 10/01/23 1402 98 %     Weight --      Height --      Head Circumference --      Peak Flow --      Pain Score 10/01/23 1400 7     Pain Loc --      Pain Education --      Exclude from Growth Chart --    No data found.  Updated Vital Signs BP 116/83 (BP Location: Left Arm)   Pulse 83   Temp 97.8 F (36.6 C) (Temporal)   Resp 16   SpO2 98%   Visual Acuity Right Eye Distance:   Left Eye Distance:   Bilateral Distance:    Right Eye Near:   Left Eye Near:    Bilateral Near:     Physical Exam Constitutional:      Appearance: Normal appearance. She is well-developed.  HENT:     Head: Normocephalic.     Right Ear: Tympanic membrane, ear canal and external ear normal.     Left Ear: Tympanic membrane, ear canal and external ear normal.     Nose: Congestion present. No rhinorrhea.     Mouth/Throat:     Pharynx: No oropharyngeal exudate or posterior oropharyngeal erythema.  Eyes:     Extraocular Movements: Extraocular movements intact.  Cardiovascular:  Rate and Rhythm: Normal rate and regular rhythm.     Pulses: Normal pulses.     Heart sounds: Normal heart sounds.  Pulmonary:     Effort: Pulmonary effort is normal.     Breath sounds: Normal breath sounds.  Musculoskeletal:     Cervical back: Normal range of motion.  Neurological:     Mental Status: She is alert.      UC Treatments / Results  Labs (all labs ordered are listed, but only abnormal results are displayed) Labs Reviewed - No data to  display  EKG   Radiology No results found.  Procedures Procedures (including critical care time)  Medications Ordered in UC Medications - No data to display  Initial Impression / Assessment and Plan / UC Course  I have reviewed the triage vital signs and the nursing notes.  Pertinent labs & imaging results that were available during my care of the patient were reviewed by me and considered in my medical decision making (see chart for details).  Acute nonrecurrent frontal sinusitis  Patient is in no signs of distress nor toxic appearing.  Vital signs are stable.  Low suspicion for pneumonia, pneumothorax or bronchitis and therefore will defer imaging.  Symptoms consistent with a sinusitis, prescribed Augmentin and prednisone.May use additional over-the-counter medications as needed for supportive care.  May follow-up with urgent care as needed if symptoms persist or worsen.  = Final Clinical Impressions(s) / UC Diagnoses   Final diagnoses:  Acute non-recurrent frontal sinusitis     Discharge Instructions      Begin Augmentin every morning and every evening for 10 days  Begin prednisone every morning for 6 days    You can take Tylenol and/or Ibuprofen as needed for fever reduction and pain relief.   For cough: honey 1/2 to 1 teaspoon (you can dilute the honey in water or another fluid).  You can also use guaifenesin and dextromethorphan for cough. You can use a humidifier for chest congestion and cough.  If you don't have a humidifier, you can sit in the bathroom with the hot shower running.      For sore throat: try warm salt water gargles, cepacol lozenges, throat spray, warm tea or water with lemon/honey, popsicles or ice, or OTC cold relief medicine for throat discomfort.   For congestion: take a daily anti-histamine like Zyrtec, Claritin, and a oral decongestant, such as pseudoephedrine.  You can also use Flonase 1-2 sprays in each nostril daily.   It is important to  stay hydrated: drink plenty of fluids (water, gatorade/powerade/pedialyte, juices, or teas) to keep your throat moisturized and help further relieve irritation/discomfort.    ED Prescriptions     Medication Sig Dispense Auth. Provider   amoxicillin-clavulanate (AUGMENTIN) 875-125 MG tablet Take 1 tablet by mouth every 12 (twelve) hours for 10 days. 20 tablet Tempestt Silba R, NP   predniSONE (STERAPRED UNI-PAK 21 TAB) 10 MG (21) TBPK tablet Take by mouth daily. Take 6 tabs by mouth daily  for 1 days, then 5 tabs for 1 days, then 4 tabs for 1 days, then 3 tabs for 1 days, 2 tabs for 1 days, then 1 tab by mouth daily for 1 days 21 tablet Taquanna Borras, Elita Boone, NP      PDMP not reviewed this encounter.   Valinda Hoar, NP 10/01/23 1433

## 2023-10-01 NOTE — Discharge Instructions (Signed)
 Begin Augmentin every morning and every evening for 10 days  Begin prednisone every morning for 6 days    You can take Tylenol and/or Ibuprofen as needed for fever reduction and pain relief.   For cough: honey 1/2 to 1 teaspoon (you can dilute the honey in water or another fluid).  You can also use guaifenesin and dextromethorphan for cough. You can use a humidifier for chest congestion and cough.  If you don't have a humidifier, you can sit in the bathroom with the hot shower running.      For sore throat: try warm salt water gargles, cepacol lozenges, throat spray, warm tea or water with lemon/honey, popsicles or ice, or OTC cold relief medicine for throat discomfort.   For congestion: take a daily anti-histamine like Zyrtec, Claritin, and a oral decongestant, such as pseudoephedrine.  You can also use Flonase 1-2 sprays in each nostril daily.   It is important to stay hydrated: drink plenty of fluids (water, gatorade/powerade/pedialyte, juices, or teas) to keep your throat moisturized and help further relieve irritation/discomfort.

## 2023-10-01 NOTE — ED Triage Notes (Signed)
 Patient presents to [facility] for evaluation of over a week of nasal congestion head pressure and sore throat.  No fever at all.

## 2023-12-31 ENCOUNTER — Telehealth: Admitting: Family Medicine

## 2023-12-31 DIAGNOSIS — J019 Acute sinusitis, unspecified: Secondary | ICD-10-CM

## 2023-12-31 DIAGNOSIS — B9689 Other specified bacterial agents as the cause of diseases classified elsewhere: Secondary | ICD-10-CM

## 2023-12-31 MED ORDER — AMOXICILLIN-POT CLAVULANATE 875-125 MG PO TABS: 1.0000 | ORAL_TABLET | Freq: Two times a day (BID) | ORAL | 0 refills | Status: AC

## 2023-12-31 NOTE — Progress Notes (Signed)

## 2024-03-11 ENCOUNTER — Telehealth: Admitting: Family Medicine

## 2024-03-11 DIAGNOSIS — M545 Low back pain, unspecified: Secondary | ICD-10-CM

## 2024-03-11 MED ORDER — NAPROXEN 500 MG PO TABS: 500.0000 mg | ORAL_TABLET | Freq: Two times a day (BID) | ORAL | 0 refills | Status: DC

## 2024-03-11 MED ORDER — CYCLOBENZAPRINE HCL 10 MG PO TABS: 10.0000 mg | ORAL_TABLET | Freq: Three times a day (TID) | ORAL | 0 refills | Status: DC | PRN

## 2024-03-11 NOTE — Progress Notes (Signed)
 E-Visit for Back Pain   We are sorry that you are not feeling well.  Here is how we plan to help!  Based on what you have shared with me it looks like you mostly have acute back pain.  Acute back pain is defined as musculoskeletal pain that can resolve in 1-3 weeks with conservative treatment.  I have prescribed Naprosyn 500 mg take one by mouth twice a day non-steroid anti-inflammatory (NSAID) as well as Flexeril 10 mg every eight hours as needed which is a muscle relaxer  Some patients experience stomach irritation or in increased heartburn with anti-inflammatory drugs.  Please keep in mind that muscle relaxer's can cause fatigue and should not be taken while at work or driving.  Back pain is very common.  The pain often gets better over time.  The cause of back pain is usually not dangerous.  Most people can learn to manage their back pain on their own.  Home Care Stay active.  Start with short walks on flat ground if you can.  Try to walk farther each day. Do not sit, drive or stand in one place for more than 30 minutes.  Do not stay in bed. Do not avoid exercise or work.  Activity can help your back heal faster. Be careful when you bend or lift an object.  Bend at your knees, keep the object close to you, and do not twist. Sleep on a firm mattress.  Lie on your side, and bend your knees.  If you lie on your back, put a pillow under your knees. Only take medicines as told by your doctor. Put ice on the injured area. Put ice in a plastic bag Place a towel between your skin and the bag Leave the ice on for 15-20 minutes, 3-4 times a day for the first 2-3 days. 210 After that, you can switch between ice and heat packs. Ask your doctor about back exercises or massage. Avoid feeling anxious or stressed.  Find good ways to deal with stress, such as exercise.  Get Help Right Way If: Your pain does not go away with rest or medicine. Your pain does not go away in 1 week. You have new  problems. You do not feel well. The pain spreads into your legs. You cannot control when you poop (bowel movement) or pee (urinate) You feel sick to your stomach (nauseous) or throw up (vomit) You have belly (abdominal) pain. You feel like you may pass out (faint). If you develop a fever.  Make Sure you: Understand these instructions. Will watch your condition Will get help right away if you are not doing well or get worse.  Your e-visit answers were reviewed by a board certified advanced clinical practitioner to complete your personal care plan.  Depending on the condition, your plan could have included both over the counter or prescription medications.  If there is a problem please reply  once you have received a response from your provider.  Your safety is important to [place].  If you have drug allergies check your prescription carefully.    You can use MyChart to ask questions about today's visit, request a non-urgent call back, or ask for a work or school excuse for 24 hours related to this e-Visit. If it has been greater than 24 hours you will need to follow up with your provider, or enter a new e-Visit to address those concerns.  You will get an e-mail in the next two days asking about  your experience.  I hope that your e-visit has been valuable and will speed your recovery. Thank you for using e-visits.    have provided 5 minutes of non face to face time during this encounter for chart review and documentation.

## 2024-04-13 ENCOUNTER — Telehealth: Admitting: Physician Assistant

## 2024-04-13 DIAGNOSIS — B9689 Other specified bacterial agents as the cause of diseases classified elsewhere: Secondary | ICD-10-CM

## 2024-04-13 DIAGNOSIS — J019 Acute sinusitis, unspecified: Secondary | ICD-10-CM

## 2024-04-13 MED ORDER — AMOXICILLIN-POT CLAVULANATE 875-125 MG PO TABS: 1.0000 | ORAL_TABLET | Freq: Two times a day (BID) | ORAL | 0 refills | Status: DC

## 2024-04-13 MED ORDER — PREDNISONE 10 MG (21) PO TBPK: ORAL_TABLET | ORAL | 0 refills | Status: DC

## 2024-04-13 NOTE — Progress Notes (Signed)
 E-Visit for Sinus Problems  We are sorry that you are not feeling well.  Here is how we plan to help!  Based on what you have shared with me it looks like you have sinusitis.  Sinusitis is inflammation and infection in the sinus cavities of the head.  Based on your presentation I believe you most likely have Acute Bacterial Sinusitis.  This is an infection caused by bacteria and is treated with antibiotics. I have prescribed Augmentin  875mg /125mg  one tablet twice daily with food, for 7 days. I have also prescribed Prednisone  6 day taper. You may use an oral decongestant such as Mucinex  D or if you have glaucoma or high blood pressure use plain Mucinex . Saline nasal spray help and can safely be used as often as needed for congestion.  If you develop worsening sinus pain, fever or notice severe headache and vision changes, or if symptoms are not better after completion of antibiotic, please schedule an appointment with a health care provider.    Sinus infections are not as easily transmitted as other respiratory infection, however we still recommend that you avoid close contact with loved ones, especially the very young and elderly.  Remember to wash your hands thoroughly throughout the day as this is the number one way to prevent the spread of infection!  Home Care: Only take medications as instructed by your medical team. Complete the entire course of an antibiotic. Do not take these medications with alcohol. A steam or ultrasonic humidifier can help congestion.  You can place a towel over your head and breathe in the steam from hot water  coming from a faucet. Avoid close contacts especially the very young and the elderly. Cover your mouth when you cough or sneeze. Always remember to wash your hands.  Get Help Right Away If: You develop worsening fever or sinus pain. You develop a severe head ache or visual changes. Your symptoms persist after you have completed your treatment plan.  Make sure  you Understand these instructions. Will watch your condition. Will get help right away if you are not doing well or get worse.  Thank you for choosing an e-visit.  Your e-visit answers were reviewed by a board certified advanced clinical practitioner to complete your personal care plan. Depending upon the condition, your plan could have included both over the counter or prescription medications.  Please review your pharmacy choice. Make sure the pharmacy is open so you can pick up prescription now. If there is a problem, you may contact your provider through [employer] and have the prescription routed to another pharmacy.  Your safety is important to us . If you have drug allergies check your prescription carefully.   For the next 24 hours you can use MyChart to ask questions about today's visit, request a non-urgent call back, or ask for a work or school excuse. You will get an email in the next two days asking about your experience. I hope that your e-visit has been valuable and will speed your recovery.    I have spent 5 minutes in review of e-visit questionnaire, review and updating patient chart, medical decision making and response to patient.   Delon CHRISTELLA Dickinson, PA-C

## 2024-05-25 ENCOUNTER — Telehealth: Admitting: Physician Assistant

## 2024-05-25 DIAGNOSIS — J329 Chronic sinusitis, unspecified: Secondary | ICD-10-CM

## 2024-05-25 NOTE — Progress Notes (Signed)
  Because you have recurrent symptoms that did not respond to previous first-line antibiotic and steroid treatments, I feel your condition warrants further evaluation and I recommend that you be seen in a face-to-face visit.   NOTE: There will be NO CHARGE for this E-Visit   If you are having a true medical emergency, please call 911.     For an urgent face to face visit, [place] has multiple urgent care centers for your convenience.  Click the link below for the full list of locations and hours, walk-in wait times, appointment scheduling options and driving directions:  Urgent Care - [place], [place], [place], [place], [place], [place]  [place]     Your MyChart E-visit questionnaire answers were reviewed by a board certified advanced clinical practitioner to complete your personal care plan based on your specific symptoms.    Thank you for using e-Visits.

## 2024-05-25 NOTE — Progress Notes (Signed)
 Message sent to patient, awaiting response.

## 2024-05-26 ENCOUNTER — Ambulatory Visit
Admission: RE | Admit: 2024-05-26 | Discharge: 2024-05-26 | Disposition: A | Discharge status: Home / Self Care | Source: Ambulatory Visit | Attending: Emergency Medicine | Admitting: Emergency Medicine

## 2024-05-26 VITALS — BP 116/79 | HR 105 | Temp 98.3°F | Resp 20

## 2024-05-26 DIAGNOSIS — J0140 Acute pansinusitis, unspecified: Secondary | ICD-10-CM

## 2024-05-26 MED ORDER — CEFDINIR 300 MG PO CAPS: 300.0000 mg | ORAL_CAPSULE | Freq: Two times a day (BID) | ORAL | 0 refills | Status: AC

## 2024-05-26 MED ORDER — PREDNISONE 10 MG (21) PO TBPK: ORAL_TABLET | Freq: Every day | ORAL | 0 refills | Status: DC

## 2024-05-26 NOTE — Discharge Instructions (Signed)
 Begin cefdinir  twice daily for 7 days for treatment of a sinus infection  Begin prednisone  every morning as directed to further help reduce sinus pressure, avoid ibuprofen  while taking you may use Tylenol     You can take Tylenol   as needed for fever reduction and pain relief.   For cough: honey 1/2 to 1 teaspoon (you can dilute the honey in water  or another fluid).  You can also use guaifenesin  and dextromethorphan for cough. You can use a humidifier for chest congestion and cough.  If you don't have a humidifier, you can sit in the bathroom with the hot shower running.      For sore throat: try warm salt water  gargles, cepacol lozenges, throat spray, warm tea or water  with lemon/honey, popsicles or ice, or OTC cold relief medicine for throat discomfort.   For congestion: take a daily anti-histamine like Zyrtec, Claritin, and a oral decongestant, such as pseudoephedrine.  You can also use Flonase  1-2 sprays in each nostril daily.   It is important to stay hydrated: drink plenty of fluids (water , gatorade/powerade/pedialyte, juices, or teas) to keep your throat moisturized and help further relieve irritation/discomfort.

## 2024-05-26 NOTE — ED Triage Notes (Signed)
 Patient reports sinus pressure and bilateral ear pain x 5 days. Patient has taken sudafed without any relief.

## 2024-05-26 NOTE — ED Provider Notes (Signed)
 CAY RALPH PELT    CSN: [id number] Arrival date & time: 05/26/24  1147      History   Chief Complaint Chief Complaint  Patient presents with   Otalgia   Facial Pain    HPI Alejandra Cook is a 43 y.o. female.   Patient presents for evaluation of nasal congestion, sinus pressure to the forehead and the cheeks, bilateral ear fullness and a sore throat present for 5 days.  Has attempted use of Sudafed which has been ineffective.  Tolerable to food and liquids.  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  Denies fever chills body aches, shortness of breath cough or wheezing.  History of reoccurring sinusitis followed by ear nose and throat but has not been seen in approximately a year.  Past Medical History:  Diagnosis Date   Allergy    Dr. Hester, gold and nickel allergy   Anxiety    Bulging discs    Degenerative disc disease, lumbar    Depression    off of zoloft for 1 year, had problems with depression and anxiety after last child   Fibromyalgia    after pregnancy, Followed by [facility]   Fibromyalgia    Headache(784.0)    Dr. Oneita at [facility]; migraines since elementary    IBS (irritable bowel syndrome)    Intrahepatic cholestasis of pregnancy in third trimester, antepartum    Spontaneous abortion in second trimester Dec 2014    Patient Active Problem List   Diagnosis Date Noted   B12 deficiency 02/19/2023   Gastric erosion    Gastric erythema    Gallbladder polyp 12/29/2021   Conjunctivitis 11/02/2021   Shingles 02/27/2021   Sinus tachycardia 07/21/2020   Heart palpitations 06/26/2020   Otitis media 06/02/2020   Low back pain 02/23/2020   Amenorrhea 01/25/2020   Depression, major, single episode, mild 11/23/2019   Irritable bowel syndrome 09/10/2019   Intractable migraine with aura without status migrainosus 11/07/2018   Contusion of left hand 09/09/2018   Polycythemia 12/01/2017   History of migraine 12/20/2016   Other  fatigue 12/20/2016   Primary insomnia 12/20/2016   History of IBS 12/20/2016   History of renal calculi 12/20/2016   History of sinusitis 12/20/2016   Bug bites 01/23/2016   Bronchitis 08/11/2014   Routine general medical examination at a health care facility 01/21/2013   Fibromyalgia 01/17/2012   Allergic rhinitis 01/17/2012   Sinusitis 04/20/2010   Migraine headache 05/12/2007   DDD (degenerative disc disease), lumbosacral 05/12/2007    Past Surgical History:  Procedure Laterality Date   COLONOSCOPY     normal   COLPOSCOPY  2002   CYSTOSCOPY     ureteral stent inserted   DILATION AND EVACUATION N/A 08/07/2013   Procedure: DILATATION AND EVACUATION;  Surgeon: Truman Corona, MD;  Location: [facility];  Service: Gynecology;  Laterality: N/A;   ESOPHAGOGASTRODUODENOSCOPY (EGD) WITH PROPOFOL  N/A 05/24/2022   Procedure: ESOPHAGOGASTRODUODENOSCOPY (EGD) WITH PROPOFOL ;  Surgeon: Unk Corinn Skiff, MD;  Location: [facility];  Service: Endoscopy;  Laterality: N/A;   LAPAROSCOPY N/A 03/20/2013   Procedure: LAPAROSCOPY OPERATIVE  IUD REMOVAL;  Surgeon: Truman Corona, MD;  Location: [facility];  Service: Gynecology;  Laterality: N/A;  Intraabdominal from [place]   NASAL SINUS SURGERY     PILONIDAL CYST EXCISION     VAGINAL DELIVERY  2011    OB History     Gravida  4   Para  2   Term  1  Preterm  1   AB  2   Living  2      SAB  2   IAB      Ectopic      Multiple  0   Live Births  2            Home Medications    Prior to Admission medications   Medication Sig Start Date End Date Taking? Authorizing Provider  cefdinir  (OMNICEF ) 300 MG capsule Take 1 capsule (300 mg total) by mouth 2 (two) times daily for 7 days. 05/26/24 06/02/24 Yes Irvan Tiedt R, NP  predniSONE  (STERAPRED UNI-PAK 21 TAB) 10 MG (21) TBPK tablet Take by mouth daily. Take 6 tabs by mouth daily  for 1 days, then 5 tabs for 1 days, then 4 tabs for 1 days, then 3 tabs for 1 days, 2 tabs  for 1 days, then 1 tab by mouth daily for 1 days 05/26/24  Yes Alexzia Kasler R, NP  buPROPion  (WELLBUTRIN  XL) 300 MG 24 hr tablet TAKE 1 TABLET BY MOUTH EVERY DAY IN THE MORNING 08/20/23   Dineen Rollene MATSU, FNP  gabapentin  (NEURONTIN ) 300 MG capsule Take 600 mg by mouth at bedtime. 01/09/22   [provider]  LO LOESTRIN FE 1 MG-10 MCG / 10 MCG tablet Take 1 tablet by mouth daily. as directed 12/19/16   [provider]  ondansetron  (ZOFRAN ) 4 MG tablet TAKE 1 TABLET (4 MG TOTAL) BY MOUTH DAILY AS NEEDED FOR NAUSEA OR VOMITING. 02/15/18   Dineen Rollene MATSU, FNP  promethazine  (PHENERGAN ) 12.5 MG tablet Take 1-2 tablets (12.5-25 mg total) by mouth once as needed for up to 1 dose for nausea or vomiting. With migraines. 10/30/17   McLean-Scocuzza, Randine SAILOR, MD  rizatriptan  (MAXALT ) 10 MG tablet Take 1 tablet (10 mg total) by mouth as needed for migraine. May repeat in 2 hours if needed. Max dose 3 pills in 24 hours 07/25/17   McLean-Scocuzza, Randine SAILOR, MD  sertraline (ZOLOFT) 100 MG tablet Take 100 mg by mouth daily. 11/26/16   [provider]  sertraline (ZOLOFT) 100 MG tablet Take 1 tablet by mouth daily.    [provider]  SUMAtriptan  (IMITREX ) 100 MG tablet Take by mouth as needed. 05/04/21   [provider]  UBRELVY 100 MG TABS Take 1 tablet by mouth daily as needed.    [provider]  zonisamide (ZONEGRAN) 100 MG capsule take 2 tabs at hs 12/18/16   [provider]    Family History Family History  Problem Relation Age of Onset   Healthy Mother    Hypertension Father    Cancer Maternal Grandmother        cancer, breast   Cancer Paternal Grandmother        breast   Healthy Sister    Hypertension Paternal Grandfather    Healthy Daughter    Healthy Son     Social History Social History   Tobacco Use   Smoking status: Never   Smokeless tobacco: Never  Vaping Use   Vaping status: Never Used  Substance Use Topics   Alcohol  use: No    Alcohol/week: 0.0 standard drinks of alcohol   Drug use: Never     Allergies   Patient has no known allergies.   Review of Systems Review of Systems   Physical Exam Triage Vital Signs ED Triage Vitals  Encounter Vitals Group     BP 05/26/24 1210 116/79  Girls Systolic BP Percentile --      Girls Diastolic BP Percentile --      Boys Systolic BP Percentile --      Boys Diastolic BP Percentile --      Pulse Rate 05/26/24 1210 (!) 105     Resp 05/26/24 1210 20     Temp 05/26/24 1210 98.3 F (36.8 C)     Temp Source 05/26/24 1210 Oral     SpO2 05/26/24 1210 98 %     Weight --      Height --      Head Circumference --      Peak Flow --      Pain Score 05/26/24 1206 6     Pain Loc --      Pain Education --      Exclude from Growth Chart --    No data found.  Updated Vital Signs BP 116/79 (BP Location: Left Arm)   Pulse (!) 105   Temp 98.3 F (36.8 C) (Oral)   Resp 20   LMP  (LMP Unknown)   SpO2 98%   Visual Acuity Right Eye Distance:   Left Eye Distance:   Bilateral Distance:    Right Eye Near:   Left Eye Near:    Bilateral Near:     Physical Exam Constitutional:      Appearance: Normal appearance.  HENT:     Head: Normocephalic.     Right Ear: Tympanic membrane, ear canal and external ear normal.     Left Ear: Tympanic membrane, ear canal and external ear normal.     Nose: Congestion present.     Right Sinus: Maxillary sinus tenderness and frontal sinus tenderness present.     Left Sinus: Maxillary sinus tenderness and frontal sinus tenderness present.     Mouth/Throat:     Pharynx: Posterior oropharyngeal erythema present. No oropharyngeal exudate.  Eyes:     Extraocular Movements: Extraocular movements intact.  Cardiovascular:     Rate and Rhythm: Normal rate and regular rhythm.     Pulses: Normal pulses.     Heart sounds: Normal heart sounds.  Pulmonary:     Effort: Pulmonary effort is normal.     Breath sounds: Normal breath  sounds.  Lymphadenopathy:     Cervical: Cervical adenopathy present.  Neurological:     General: No focal deficit present.     Mental Status: She is alert and oriented to person, place, and time. Mental status is at baseline.      UC Treatments / Results  Labs (all labs ordered are listed, but only abnormal results are displayed) Labs Reviewed - No data to display  EKG   Radiology No results found.  Procedures Procedures (including critical care time)  Medications Ordered in UC Medications - No data to display  Initial Impression / Assessment and Plan / UC Course  I have reviewed the triage vital signs and the nursing notes.  Pertinent labs & imaging results that were available during my care of the patient were reviewed by me and considered in my medical decision making (see chart for details).  Acute nonrecurrent pansinusitis  Patient is in no signs of distress nor toxic appearing.  Vital signs are stable.  Low suspicion for pneumonia, pneumothorax or bronchitis and therefore will defer imaging.  Presentation consistent with a sinusitis, prescribed Augmentin  and prednisone .May use additional over-the-counter medications as needed for supportive care.  May follow-up with urgent care as needed if symptoms persist or  worsen.    Final Clinical Impressions(s) / UC Diagnoses   Final diagnoses:  Acute non-recurrent pansinusitis     Discharge Instructions      Begin cefdinir  twice daily for 7 days for treatment of a sinus infection  Begin prednisone  every morning as directed to further help reduce sinus pressure, avoid ibuprofen  while taking you may use Tylenol     You can take Tylenol   as needed for fever reduction and pain relief.   For cough: honey 1/2 to 1 teaspoon (you can dilute the honey in water  or another fluid).  You can also use guaifenesin  and dextromethorphan for cough. You can use a humidifier for chest congestion and cough.  If you don't have a humidifier,  you can sit in the bathroom with the hot shower running.      For sore throat: try warm salt water  gargles, cepacol lozenges, throat spray, warm tea or water  with lemon/honey, popsicles or ice, or OTC cold relief medicine for throat discomfort.   For congestion: take a daily anti-histamine like Zyrtec, Claritin, and a oral decongestant, such as pseudoephedrine.  You can also use Flonase  1-2 sprays in each nostril daily.   It is important to stay hydrated: drink plenty of fluids (water , gatorade/powerade/pedialyte, juices, or teas) to keep your throat moisturized and help further relieve irritation/discomfort.    ED Prescriptions     Medication Sig Dispense Auth. Provider   cefdinir  (OMNICEF ) 300 MG capsule Take 1 capsule (300 mg total) by mouth 2 (two) times daily for 7 days. 14 capsule Malvern Kadlec R, NP   predniSONE  (STERAPRED UNI-PAK 21 TAB) 10 MG (21) TBPK tablet Take by mouth daily. Take 6 tabs by mouth daily  for 1 days, then 5 tabs for 1 days, then 4 tabs for 1 days, then 3 tabs for 1 days, 2 tabs for 1 days, then 1 tab by mouth daily for 1 days 21 tablet Reign Bartnick, Shelba SAUNDERS, NP      PDMP not reviewed this encounter.   Teresa Shelba SAUNDERS, NP 05/26/24 1230

## 2024-07-04 ENCOUNTER — Encounter

## 2024-07-04 ENCOUNTER — Telehealth: Admitting: Nurse Practitioner

## 2024-07-04 DIAGNOSIS — M2550 Pain in unspecified joint: Secondary | ICD-10-CM

## 2024-07-04 MED ORDER — IBUPROFEN 800 MG PO TABS: 800.0000 mg | ORAL_TABLET | Freq: Three times a day (TID) | ORAL | 0 refills | Status: AC | PRN

## 2024-07-04 MED ORDER — CYCLOBENZAPRINE HCL 5 MG PO TABS: 5.0000 mg | ORAL_TABLET | Freq: Three times a day (TID) | ORAL | 1 refills | Status: DC | PRN

## 2024-07-04 NOTE — Progress Notes (Signed)
 Unfortunately Tamilflu has to be given within 72 hours of symptoms onset. At this time we will treat your symptoms with NSAID and muscle relaxant.   E visit for Flu like symptoms   We are sorry that you are not feeling well.  Here is how we plan to help! Based on what you have shared with me it looks like you may have flu-like symptoms that should be watched but do not seem to indicate anti-viral treatment.  Influenza or "the flu" is  an infection caused by a respiratory virus. The flu virus is highly contagious and persons who did not receive their yearly flu vaccination may "catch" the flu from close contact.  We have anti-viral medications to treat the viruses that cause this infection. They are not a "cure" and only shorten the course of the infection. These prescriptions are most effective when they are given within the first 2 days of "flu" symptoms. Antiviral medications are indicated if you have a high risk of complications from the flu. You should  also consider an antiviral medication if you are in close contact with someone who is at risk. These medications can help patients avoid complications from the flu but have side effects that you should know.   Possible side effects from Tamiflu or oseltamivir include nausea, vomiting, diarrhea, dizziness, headaches, eye redness, sleep problems or other respiratory symptoms. You should not take Tamiflu if you have an allergy to oseltamivir or any to the ingredients in Tamiflu..   For nasal congestion, you may use an oral decongestant such as Mucinex  D or if you have glaucoma or high blood pressure use plain Mucinex .  Saline nasal spray or nasal drops can help and can safely be used as often as needed for congestion.  If you have a sore or scratchy throat, use a saltwater gargle-  to  teaspoon of salt dissolved in a 4-ounce to 8-ounce glass of warm water .  Gargle the solution for approximately 15-30 seconds and then spit.  It is important not to  swallow the solution.  You can also use throat lozenges/cough drops and Chloraseptic spray to help with throat pain or discomfort.  Warm or cold liquids can also be helpful in relieving throat pain.  For headache, pain or general discomfort, you can use Ibuprofen  or Tylenol  as directed.   Some authorities believe that zinc sprays or the use of Echinacea may shorten the course of your symptoms.  I have prescribed the following medications to help lessen symptoms: IBUPROFEN  800 mg. Make sure you take this medication with food. I have also sent Flexeril  which is a muscle relaxant   You are to isolate at home until you have been fever-free for at least 24 hours without a fever-reducing medication, and symptoms have been steadily improving for 24 hours.  If you must be around other household members who do not have symptoms, you need to make sure that both you and the family members are masking consistently with a high-quality mask.  If you note any worsening of symptoms despite treatment, please seek an in-person evaluation ASAP. If you note any significant shortness of breath or any chest pain, please seek ED evaluation. Please do not delay care!  ANYONE WHO HAS FLU SYMPTOMS SHOULD: Stay home. The flu is highly contagious and going out or to work exposes others! Be sure to drink plenty of fluids. Water  is fine as well as fruit juices, sodas and electrolyte beverages. You may want to stay away from caffeine  or alcohol. If you are nauseated, try taking small sips of liquids. How do you know if you are getting enough fluid? Your urine should be a pale yellow or almost colorless. Get rest. Taking a steamy shower or using a humidifier may help nasal congestion and ease sore throat pain. Using a saline nasal spray works much the same way. Cough drops, hard candies and sore throat lozenges may ease your cough. Line up a caregiver. Have someone check on you regularly.  GET HELP RIGHT AWAY IF: You cannot  keep down liquids or your medications. You become short of breath Your fell like you are going to pass out or loose consciousness. Your symptoms persist after you have completed your treatment plan  MAKE SURE YOU  Understand these instructions. Will watch your condition. Will get help right away if you are not doing well or get worse.  Your e-visit answers were reviewed by a board certified advanced clinical practitioner to complete your personal care plan.  Depending on the condition, your plan could have included both over the counter or prescription medications.  If there is a problem please reply  once you have received a response from your provider.  Your safety is important to us .  If you have drug allergies check your prescription carefully.    You can use MyChart to ask questions about today's visit, request a non-urgent call back, or ask for a work or school excuse for 24 hours related to this e-Visit. If it has been greater than 24 hours you will need to follow up with your provider, or enter a new e-Visit to address those concerns.  You will get an e-mail in the next two days asking about your experience.  I hope that your e-visit has been valuable and will speed your recovery. Thank you for using e-visits.   I have spent 5 minutes in review of e-visit questionnaire, review and updating patient chart, medical decision making and response to patient.   Jwan Hornbaker W Chelisa Hennen, NP

## 2024-07-07 ENCOUNTER — Encounter: Payer: Self-pay | Admitting: Family

## 2024-07-07 ENCOUNTER — Ambulatory Visit: Admitting: Family

## 2024-07-07 VITALS — BP 128/70 | HR 106 | Temp 98.4°F | Ht 63.0 in | Wt 146.4 lb

## 2024-07-07 DIAGNOSIS — M797 Fibromyalgia: Secondary | ICD-10-CM

## 2024-07-07 DIAGNOSIS — M791 Myalgia, unspecified site: Secondary | ICD-10-CM | POA: Insufficient documentation

## 2024-07-07 LAB — CBC WITH DIFFERENTIAL/PLATELET
Basophils Absolute: 0.1 K/uL (ref 0.0–0.1)
Basophils Relative: 1.5 % (ref 0.0–3.0)
Eosinophils Absolute: 0.1 K/uL (ref 0.0–0.7)
Eosinophils Relative: 1.1 % (ref 0.0–5.0)
HCT: 38.7 % (ref 36.0–46.0)
Hemoglobin: 13.1 g/dL (ref 12.0–15.0)
Lymphocytes Relative: 32.9 % (ref 12.0–46.0)
Lymphs Abs: 1.7 K/uL (ref 0.7–4.0)
MCHC: 33.9 g/dL (ref 30.0–36.0)
MCV: 91.3 fl (ref 78.0–100.0)
Monocytes Absolute: 0.4 K/uL (ref 0.1–1.0)
Monocytes Relative: 7.9 % (ref 3.0–12.0)
Neutro Abs: 2.9 K/uL (ref 1.4–7.7)
Neutrophils Relative %: 56.6 % (ref 43.0–77.0)
Platelets: 284 K/uL (ref 150.0–400.0)
RBC: 4.24 Mil/uL (ref 3.87–5.11)
RDW: 12.5 % (ref 11.5–15.5)
WBC: 5.2 K/uL (ref 4.0–10.5)

## 2024-07-07 LAB — LIPID PANEL
Cholesterol: 216 mg/dL — ABNORMAL HIGH (ref 0–200)
HDL: 65.2 mg/dL (ref >39.00)
LDL Cholesterol: 138 mg/dL — ABNORMAL HIGH (ref 0–99)
NonHDL: 151.16
Total CHOL/HDL Ratio: 3
Triglycerides: 68 mg/dL (ref 0.0–149.0)
VLDL: 13.6 mg/dL (ref 0.0–40.0)

## 2024-07-07 LAB — COMPREHENSIVE METABOLIC PANEL WITH GFR
ALT: 11 U/L (ref 0–35)
AST: 13 U/L (ref 0–37)
Albumin: 4.9 g/dL (ref 3.5–5.2)
Alkaline Phosphatase: 73 U/L (ref 39–117)
BUN: 21 mg/dL (ref 6–23)
CO2: 25 meq/L (ref 19–32)
Calcium: 9.1 mg/dL (ref 8.4–10.5)
Chloride: 110 meq/L (ref 96–112)
Creatinine, Ser: 0.94 mg/dL (ref 0.40–1.20)
GFR: 74.39 mL/min (ref >60.00)
Glucose, Bld: 85 mg/dL (ref 70–99)
Potassium: 3.7 meq/L (ref 3.5–5.1)
Sodium: 141 meq/L (ref 135–145)
Total Bilirubin: 0.4 mg/dL (ref 0.2–1.2)
Total Protein: 6.7 g/dL (ref 6.0–8.3)

## 2024-07-07 LAB — SEDIMENTATION RATE: Sed Rate: 1 mm/h (ref 0–20)

## 2024-07-07 LAB — B12 AND FOLATE PANEL
Folate: 23.6 ng/mL (ref >5.9)
Vitamin B-12: 290 pg/mL (ref 211–911)

## 2024-07-07 LAB — C-REACTIVE PROTEIN: CRP: <0.5 mg/dL (ref 0.5–20.0)

## 2024-07-07 LAB — HEMOGLOBIN A1C: Hgb A1c MFr Bld: 4.5 % — ABNORMAL LOW (ref 4.6–6.5)

## 2024-07-07 LAB — VITAMIN D 25 HYDROXY (VIT D DEFICIENCY, FRACTURES): VITD: 17.18 ng/mL — ABNORMAL LOW (ref 30.00–100.00)

## 2024-07-07 LAB — TSH: TSH: 1.3 u[IU]/mL (ref 0.35–5.50)

## 2024-07-07 NOTE — Assessment & Plan Note (Signed)
 Etiology unclear. She is nontoxic in appearance. Pending cxr , labs. Advised close vigilance. Re referral to rheumatology.  Patient will let me know right away if any new symptoms develop or symptoms progress.

## 2024-07-07 NOTE — Progress Notes (Signed)
 Assessment & Plan:  Myalgia Assessment & Plan: Etiology unclear. She is nontoxic in appearance. Pending cxr , labs. Advised close vigilance. Re referral to rheumatology.  Patient will let me know right away if any new symptoms develop or symptoms progress.   Orders: -     DG Chest 2 View; Future  Fibromyalgia -     TSH -     CBC with Differential/Platelet -     Comprehensive metabolic panel with GFR -     Hemoglobin A1c -     Lipid panel -     VITAMIN D  25 Hydroxy (Vit-D Deficiency, Fractures) -     B12 and Folate Panel -     ANA -     C-reactive protein -     CYCLIC CITRUL PEPTIDE ANTIBODY, IGG/IGA -     Rheumatoid factor -     Sedimentation rate -     Ambulatory referral to Rheumatology -     DG Chest 2 View; Future     Return precautions given.   Risks, benefits, and alternatives of the medications and treatment plan prescribed today were discussed, and patient expressed understanding.   Education regarding symptom management and diagnosis given to patient on AVS either electronically or printed.  Return in about 6 weeks (around 08/18/2024).  Rollene Northern, FNP  Subjective:    Patient ID: Tinnie Silvano Mattes, female    DOB: 10/30/1980, 43 y.o.   MRN: [id number]  CC: Citlali Gautney is a 43 y.o. female who presents today for follow up.   HPI: HPI Discussed the use of AI scribe software for clinical note transcription with the patient, who gave verbal consent to proceed.  History of Present Illness   Euphemia Raeya Merritts is a 43 year old female with h/o fibromyalgia who presents with sudden onset of diffuse body pain and fatigue.  She experienced a sudden onset of diffuse body pain and fatigue starting last Tuesday, approximately one week ago. The pain is described as 'achy, hurting, tired' . It is widespread, affecting her back, legs, and arms, and is accompanied by a feeling of heaviness, particularly in her arms, making daily activities like fixing her hair  difficult.  Her history of fibromyalgia, diagnosed after the birth of her daughter, has been well-controlled until this recent episode. Typically, her fibromyalgia pain affects her hips and arms, but this episode is different, with pain across her back and legs. She took a COVID test, which was negative, and attempted a telehealth visit where she was prescribed a muscle relaxer, Flexeril . She has been taking 5 mg of Flexeril  once daily, and occasionally twice a day, along with 800 mg of ibuprofen  while at work. These medications have provided some relief, but the pain persists.  No associated fever, chills, respiratory symptoms, or joint swelling. Her energy levels are significantly reduced, and she spent an entire day in bed last Wednesday due to fatigue. Despite getting adequate sleep, she does not feel rested and remains fatigued throughout the day. Her appetite has decreased, although her weight remains stable.  previous treatment for a sinus infection with cefdinir  and prednisone  with complete resolution. She is currently taking gabapentin  600 mg at night for headaches and Wellbutrin  300 mg in the morning.   Denies depression.   No recent travel, tick bite  There is no family history of autoimmune diseases.  She is not a smoker and does not consume alcohol.     No associated fever, chills, rash, joint  swelling, abdominal pain, cough, sob    She had an evisit 07/04/24 for joint pain, suspected flu She has taken flexeril  and ibuprofen  with some relief.   She follows with neurology, last seen 11/25/23 ; follow up scheduled tomorrow Dr Maree  07/06/2021 rheumatology; fibromyalgia, raynauds disease  ANA 06/2012 negative  Following Dr German, GYN Mammogram scheduled   Allergies: Patient has no known allergies. Current Outpatient Medications on File Prior to Visit  Medication Sig Dispense Refill   buPROPion  (WELLBUTRIN  XL) 300 MG 24 hr tablet TAKE 1 TABLET BY MOUTH EVERY DAY IN THE  MORNING 90 tablet 1   cyclobenzaprine  (FLEXERIL ) 5 MG tablet Take 1 tablet (5 mg total) by mouth 3 (three) times daily as needed for muscle spasms. 30 tablet 1   gabapentin  (NEURONTIN ) 300 MG capsule Take 600 mg by mouth at bedtime.     ibuprofen  (ADVIL ) 800 MG tablet Take 1 tablet (800 mg total) by mouth every 8 (eight) hours as needed. 30 tablet 0   LO LOESTRIN FE 1 MG-10 MCG / 10 MCG tablet Take 1 tablet by mouth daily. as directed  9   ondansetron  (ZOFRAN ) 4 MG tablet TAKE 1 TABLET (4 MG TOTAL) BY MOUTH DAILY AS NEEDED FOR NAUSEA OR VOMITING. 18 tablet 1   promethazine  (PHENERGAN ) 12.5 MG tablet Take 1-2 tablets (12.5-25 mg total) by mouth once as needed for up to 1 dose for nausea or vomiting. With migraines. 20 tablet 2   rizatriptan  (MAXALT ) 10 MG tablet Take 1 tablet (10 mg total) by mouth as needed for migraine. May repeat in 2 hours if needed. Max dose 3 pills in 24 hours 10 tablet 1   SUMAtriptan  (IMITREX ) 100 MG tablet Take by mouth as needed.     UBRELVY 100 MG TABS Take 1 tablet by mouth daily as needed.     zonisamide (ZONEGRAN) 100 MG capsule take 2 tabs at hs  2   No current facility-administered medications on file prior to visit.    Review of Systems  Constitutional:  Positive for fatigue. Negative for chills and fever.  Respiratory:  Negative for cough and shortness of breath.   Cardiovascular:  Negative for chest pain and palpitations.  Gastrointestinal:  Negative for nausea and vomiting.  Musculoskeletal:  Positive for arthralgias. Negative for joint swelling.  Skin:  Negative for rash.  Neurological:  Negative for dizziness.      Objective:    BP 128/70   Pulse (!) 106   Temp 98.4 F (36.9 C) (Oral)   Ht 5' 3 (1.6 m)   Wt 146 lb 6.4 oz (66.4 kg)   LMP  (LMP Unknown)   SpO2 99%   BMI 25.93 kg/m  BP Readings from Last 3 Encounters:  07/07/24 128/70  05/26/24 116/79  10/01/23 116/83   Wt Readings from Last 3 Encounters:  07/07/24 146 lb 6.4 oz (66.4 kg)   02/19/23 142 lb (64.4 kg)  05/24/22 150 lb 6.4 oz (68.2 kg)    Physical Exam Vitals reviewed.  Constitutional:      Appearance: Normal appearance. She is well-developed.  Eyes:     Conjunctiva/sclera: Conjunctivae normal.  Neck:     Thyroid : No thyroid  mass or thyromegaly.  Cardiovascular:     Rate and Rhythm: Normal rate and regular rhythm.     Pulses: Normal pulses.     Heart sounds: Normal heart sounds.  Pulmonary:     Effort: Pulmonary effort is normal.     Breath sounds: Normal  breath sounds. No wheezing, rhonchi or rales.  Abdominal:     General: Bowel sounds are normal. There is no distension.     Palpations: Abdomen is soft. Abdomen is not rigid. There is no fluid wave or mass.     Tenderness: There is no abdominal tenderness. There is no guarding or rebound.  Lymphadenopathy:     Head:     Right side of head: No submental, submandibular, tonsillar, preauricular, posterior auricular or occipital adenopathy.     Left side of head: No submental, submandibular, tonsillar, preauricular, posterior auricular or occipital adenopathy.     Cervical: No cervical adenopathy.     Right cervical: No superficial cervical adenopathy.    Left cervical: No superficial cervical adenopathy.     Upper Body:     Right upper body: No supraclavicular or axillary adenopathy.     Left upper body: No supraclavicular or axillary adenopathy.  Skin:    General: Skin is warm and dry.  Neurological:     Mental Status: She is alert.  Psychiatric:        Speech: Speech normal.        Behavior: Behavior normal.        Thought Content: Thought content normal.

## 2024-07-08 ENCOUNTER — Other Ambulatory Visit

## 2024-07-08 LAB — RHEUMATOID FACTOR: Rheumatoid fact SerPl-aCnc: <10 [IU]/mL (ref <14)

## 2024-07-08 LAB — ANA: Anti Nuclear Antibody (ANA): NEGATIVE

## 2024-07-09 ENCOUNTER — Ambulatory Visit: Payer: Self-pay | Admitting: Family

## 2024-07-09 ENCOUNTER — Other Ambulatory Visit

## 2024-07-09 ENCOUNTER — Ambulatory Visit

## 2024-07-09 ENCOUNTER — Encounter: Payer: Self-pay | Admitting: Family

## 2024-07-09 DIAGNOSIS — M797 Fibromyalgia: Secondary | ICD-10-CM

## 2024-07-09 DIAGNOSIS — E538 Deficiency of other specified B group vitamins: Secondary | ICD-10-CM

## 2024-07-09 DIAGNOSIS — M791 Myalgia, unspecified site: Secondary | ICD-10-CM

## 2024-07-11 LAB — CYCLIC CITRUL PEPTIDE ANTIBODY, IGG/IGA: Cyclic Citrullin Peptide Ab: 22 U — ABNORMAL HIGH (ref 0–19)

## 2024-07-13 NOTE — Progress Notes (Unsigned)
 Office Visit Note  Patient: Alejandra Cook             Date of Birth: [birth year]           MRN: [id number]             PCP: Dineen Rollene MATSU, FNP Referring: Dineen Rollene MATSU, FNP Visit Date: 07/15/2024 Occupation: Data Unavailable  Subjective:  No chief complaint on file.   History of Present Illness: Alejandra Cook is a 43 y.o. female ***     Activities of Daily Living:  Patient reports morning stiffness for *** {minute/hour:19697}.   Patient {ACTIONS;DENIES/REPORTS:21021675::Denies} nocturnal pain.  Difficulty dressing/grooming: {ACTIONS;DENIES/REPORTS:21021675::Denies} Difficulty climbing stairs: {ACTIONS;DENIES/REPORTS:21021675::Denies} Difficulty getting out of chair: {ACTIONS;DENIES/REPORTS:21021675::Denies} Difficulty using hands for taps, buttons, cutlery, and/or writing: {ACTIONS;DENIES/REPORTS:21021675::Denies}  No Rheumatology ROS completed.   PMFS History:  Patient Active Problem List   Diagnosis Date Noted   Myalgia 07/07/2024   B12 deficiency 02/19/2023   Gastric erosion    Gastric erythema    Gallbladder polyp 12/29/2021   Conjunctivitis 11/02/2021   Shingles 02/27/2021   Sinus tachycardia 07/21/2020   Heart palpitations 06/26/2020   Otitis media 06/02/2020   Low back pain 02/23/2020   Amenorrhea 01/25/2020   Depression, major, single episode, mild 11/23/2019   Irritable bowel syndrome 09/10/2019   Intractable migraine with aura without status migrainosus 11/07/2018   Contusion of left hand 09/09/2018   Polycythemia 12/01/2017   History of migraine 12/20/2016   Other fatigue 12/20/2016   Primary insomnia 12/20/2016   History of IBS 12/20/2016   History of renal calculi 12/20/2016   History of sinusitis 12/20/2016   Bug bites 01/23/2016   Bronchitis 08/11/2014   Routine general medical examination at a health care facility 01/21/2013   Fibromyalgia 01/17/2012   Allergic rhinitis 01/17/2012   Sinusitis 04/20/2010    Migraine headache 05/12/2007   DDD (degenerative disc disease), lumbosacral 05/12/2007    Past Medical History:  Diagnosis Date   Allergy    Dr. Hester, gold and nickel allergy   Anxiety    Bulging discs    Degenerative disc disease, lumbar    Depression    off of zoloft for 1 year, had problems with depression and anxiety after last child   Fibromyalgia    after pregnancy, Followed by [facility]   Fibromyalgia    Headache(784.0)    Dr. Oneita at [facility]; migraines since elementary    IBS (irritable bowel syndrome)    Intrahepatic cholestasis of pregnancy in third trimester, antepartum    Spontaneous abortion in second trimester Dec 2014    Family History  Problem Relation Age of Onset   Healthy Mother    Hypertension Father    Cancer Maternal Grandmother        cancer, breast   Cancer Paternal Grandmother        breast   Healthy Sister    Hypertension Paternal Grandfather    Healthy Daughter    Healthy Son    Past Surgical History:  Procedure Laterality Date   COLONOSCOPY     normal   COLPOSCOPY  2002   CYSTOSCOPY     ureteral stent inserted   DILATION AND EVACUATION N/A 08/07/2013   Procedure: DILATATION AND EVACUATION;  Surgeon: Truman Corona, MD;  Location: [facility];  Service: Gynecology;  Laterality: N/A;   ESOPHAGOGASTRODUODENOSCOPY (EGD) WITH PROPOFOL  N/A 05/24/2022   Procedure: ESOPHAGOGASTRODUODENOSCOPY (EGD) WITH PROPOFOL ;  Surgeon: Unk Corinn Skiff, MD;  Location: [facility];  Service: Endoscopy;  Laterality: N/A;   LAPAROSCOPY N/A 03/20/2013   Procedure: LAPAROSCOPY OPERATIVE  IUD REMOVAL;  Surgeon: Truman Corona, MD;  Location: [facility];  Service: Gynecology;  Laterality: N/A;  Intraabdominal from [place]   NASAL SINUS SURGERY     PILONIDAL CYST EXCISION     VAGINAL DELIVERY  2011   Social History   Tobacco Use   Smoking status: Never   Smokeless tobacco: Never  Vaping Use   Vaping status: Never Used  Substance Use Topics    Alcohol use: No    Alcohol/week: 0.0 standard drinks of alcohol   Drug use: Never   Social History   Social History Narrative   Lives in [place]. 10YO daughter. 5 YO son.      Works- 10th and 11th; [employer]        Immunization History  Administered Date(s) Administered   Hepatitis B 09/20/1997, 10/18/1997, 03/22/1998   Hepatitis B, ADULT 09/20/1997, 10/18/1997, 03/22/1998, 04/30/2014, 06/01/2014, 11/02/2014   Influenza Inj Mdck Quad Pf 07/21/2022   Influenza Nasal 05/07/2019   Influenza Split 08/06/2013   Influenza, Seasonal, Injecte, Preservative Fre 06/06/2023, 05/14/2024   Influenza,inj,Quad PF,6+ Mos 04/28/2014, 04/14/2016, 06/22/2019   Influenza,inj,quad, With Preservative 05/31/2017, 05/06/2018   Influenza-Unspecified 05/23/2012, 04/28/2014, 04/14/2016, 06/06/2017, 06/07/2018, 06/06/2023   Moderna Covid-19 Vaccine Bivalent Booster 20yrs & up 06/28/2021   Moderna Sars-Covid-2 Vaccination 10/02/2019, 10/30/2019, 06/17/2020   PPD Test 04/28/2014   Td 10/05/1995   Tdap 01/16/2010, 11/02/2010, 04/14/2024     Objective: Vital Signs: LMP  (LMP Unknown)    Physical Exam   Musculoskeletal Exam: ***  CDAI Exam: CDAI Score: -- Patient Global: --; Provider Global: -- Swollen: --; Tender: -- Joint Exam 07/15/2024   No joint exam has been documented for this visit   There is currently no information documented on the homunculus. Go to the Rheumatology activity and complete the homunculus joint exam.  Investigation: No additional findings.  Imaging: DG Chest 2 View Result Date: 07/13/2024 CLINICAL DATA:  Acute onset fatigue EXAM: CHEST - 2 VIEW COMPARISON:  None Available. FINDINGS: The heart size and mediastinal contours are within normal limits. Both lungs are clear. The visualized skeletal structures are unremarkable. IMPRESSION: No active cardiopulmonary disease. Electronically Signed   By: Maude Naegeli M.D.   On: 07/13/2024 08:20    Recent Labs: Lab  Results  Component Value Date   WBC 5.2 07/07/2024   HGB 13.1 07/07/2024   PLT 284.0 07/07/2024   NA 141 07/07/2024   K 3.7 07/07/2024   CL 110 07/07/2024   CO2 25 07/07/2024   GLUCOSE 85 07/07/2024   BUN 21 07/07/2024   CREATININE 0.94 07/07/2024   BILITOT 0.4 07/07/2024   ALKPHOS 73 07/07/2024   AST 13 07/07/2024   ALT 11 07/07/2024   PROT 6.7 07/07/2024   ALBUMIN 4.9 07/07/2024   CALCIUM 9.1 07/07/2024   GFRAA >60 02/25/2020    Speciality Comments: No specialty comments available.  Procedures:  No procedures performed Allergies: Patient has no known allergies.   Assessment / Plan:     Visit Diagnoses: Fibromyalgia  Primary insomnia  Other fatigue  Lateral epicondylitis of both elbows  Degeneration of intervertebral disc of lumbar region without discogenic back pain or lower extremity pain  Trochanteric bursitis of both hips  Raynaud's phenomenon without gangrene - AVISE panel results was reviewed with the patient today: Negative index.  SCL 70, anti-RNA polymerase III, and anticentromere antibodies negative.  Vitamin D  deficiency  Orders: No orders  of the defined types were placed in this encounter.  No orders of the defined types were placed in this encounter.   Face-to-face time spent with patient was *** minutes. Greater than 50% of time was spent in counseling and coordination of care.  Follow-Up Instructions: No follow-ups on file.   Waddell CHRISTELLA Craze, PA-C  Note - This record has been created using Dragon software.  Chart creation errors have been sought, but may not always  have been located. Such creation errors do not reflect on  the standard of medical care.

## 2024-07-15 ENCOUNTER — Encounter: Payer: Self-pay | Admitting: Physician Assistant

## 2024-07-15 ENCOUNTER — Ambulatory Visit: Attending: Physician Assistant | Admitting: Physician Assistant

## 2024-07-15 VITALS — BP 128/86 | HR 93 | Temp 97.9°F | Resp 14 | Ht 63.0 in | Wt 152.2 lb

## 2024-07-15 DIAGNOSIS — M51369 Other intervertebral disc degeneration, lumbar region without mention of lumbar back pain or lower extremity pain: Secondary | ICD-10-CM

## 2024-07-15 DIAGNOSIS — M7711 Lateral epicondylitis, right elbow: Secondary | ICD-10-CM | POA: Diagnosis not present

## 2024-07-15 DIAGNOSIS — F5101 Primary insomnia: Secondary | ICD-10-CM | POA: Diagnosis not present

## 2024-07-15 DIAGNOSIS — M7061 Trochanteric bursitis, right hip: Secondary | ICD-10-CM

## 2024-07-15 DIAGNOSIS — R5383 Other fatigue: Secondary | ICD-10-CM | POA: Diagnosis not present

## 2024-07-15 DIAGNOSIS — R52 Pain, unspecified: Secondary | ICD-10-CM | POA: Diagnosis not present

## 2024-07-15 DIAGNOSIS — E559 Vitamin D deficiency, unspecified: Secondary | ICD-10-CM

## 2024-07-15 DIAGNOSIS — M7062 Trochanteric bursitis, left hip: Secondary | ICD-10-CM

## 2024-07-15 DIAGNOSIS — R7681 Abnormal rheumatoid factor and anti-citrullinated protein antibody without rheumatoid arthritis: Secondary | ICD-10-CM

## 2024-07-15 DIAGNOSIS — E538 Deficiency of other specified B group vitamins: Secondary | ICD-10-CM | POA: Diagnosis not present

## 2024-07-15 DIAGNOSIS — I7300 Raynaud's syndrome without gangrene: Secondary | ICD-10-CM | POA: Diagnosis not present

## 2024-07-15 DIAGNOSIS — M791 Myalgia, unspecified site: Secondary | ICD-10-CM

## 2024-07-15 DIAGNOSIS — M797 Fibromyalgia: Secondary | ICD-10-CM

## 2024-07-15 DIAGNOSIS — M7712 Lateral epicondylitis, left elbow: Secondary | ICD-10-CM

## 2024-07-17 ENCOUNTER — Ambulatory Visit: Payer: Self-pay | Admitting: Physician Assistant

## 2024-07-17 NOTE — Progress Notes (Signed)
 Please forward results to PCP as requested by the patient.

## 2024-07-20 NOTE — Progress Notes (Signed)
 MCV negative.  CK slightly elevated-not concerning for myositis. IFE pending.

## 2024-07-21 LAB — PROTEIN ELECTROPHORESIS, SERUM, WITH REFLEX
Albumin ELP: 4.8 g/dL (ref 3.8–4.8)
Alpha 1: 0.3 g/dL (ref 0.2–0.3)
Alpha 2: 0.7 g/dL (ref 0.5–0.9)
Beta 2: 0.3 g/dL (ref 0.2–0.5)
Beta Globulin: 0.4 g/dL (ref 0.4–0.6)
Gamma Globulin: 0.5 g/dL — ABNORMAL LOW (ref 0.8–1.7)
Total Protein: 6.9 g/dL (ref 6.1–8.1)

## 2024-07-21 LAB — INTRINSIC FACTOR BLOCKING ANTIBODY: Intrinsic Factor: NEGATIVE

## 2024-07-21 LAB — MUTATED CITRULLINATED VIMENTIN (MCV) ANTIBODY: MUTATED CITRULLINATED VIMENTIN (MCV) AB: <20 U/mL (ref <20)

## 2024-07-21 LAB — IFE INTERPRETATION

## 2024-07-21 LAB — IRON,TIBC AND FERRITIN PANEL
%SAT: 7 % — ABNORMAL LOW (ref 16–45)
Ferritin: 7 ng/mL — ABNORMAL LOW (ref 16–232)
Iron: 27 ug/dL — ABNORMAL LOW (ref 40–190)
TIBC: 406 ug/dL (ref 250–450)

## 2024-07-21 LAB — HOMOCYSTEINE: Homocysteine: 11.3 umol/L — ABNORMAL HIGH (ref <=11.0)

## 2024-07-21 LAB — CK: Total CK: 17 U/L — ABNORMAL LOW (ref 20–239)

## 2024-07-21 LAB — TISSUE TRANSGLUTAMINASE, IGA: (tTG) Ab, IgA: <1 U/mL

## 2024-07-21 LAB — METHYLMALONIC ACID, SERUM: Methylmalonic Acid, Quant: 313 nmol/L (ref 55–335)

## 2024-07-22 NOTE — Progress Notes (Signed)
 IFE normal pattern-good news!

## 2024-08-06 ENCOUNTER — Encounter: Payer: Self-pay | Admitting: Family

## 2024-08-14 ENCOUNTER — Other Ambulatory Visit: Payer: Self-pay | Admitting: Family

## 2024-08-14 DIAGNOSIS — M2550 Pain in unspecified joint: Secondary | ICD-10-CM

## 2024-08-14 MED ORDER — CYCLOBENZAPRINE HCL 5 MG PO TABS: 5.0000 mg | ORAL_TABLET | Freq: Two times a day (BID) | ORAL | 1 refills | Status: AC | PRN

## 2024-08-18 ENCOUNTER — Ambulatory Visit: Admitting: Family

## 2024-08-28 ENCOUNTER — Ambulatory Visit: Payer: Self-pay | Admitting: Family

## 2024-08-28 ENCOUNTER — Other Ambulatory Visit: Payer: Self-pay | Admitting: Family

## 2024-08-28 ENCOUNTER — Encounter: Payer: Self-pay | Admitting: Family

## 2024-08-28 ENCOUNTER — Ambulatory Visit: Admitting: Family

## 2024-08-28 VITALS — BP 120/78 | HR 96 | Temp 98.2°F | Ht 63.0 in | Wt 154.8 lb

## 2024-08-28 DIAGNOSIS — E538 Deficiency of other specified B group vitamins: Secondary | ICD-10-CM | POA: Diagnosis not present

## 2024-08-28 DIAGNOSIS — J029 Acute pharyngitis, unspecified: Secondary | ICD-10-CM | POA: Insufficient documentation

## 2024-08-28 DIAGNOSIS — R790 Abnormal level of blood mineral: Secondary | ICD-10-CM | POA: Insufficient documentation

## 2024-08-28 DIAGNOSIS — M797 Fibromyalgia: Secondary | ICD-10-CM | POA: Diagnosis not present

## 2024-08-28 LAB — POCT RAPID STREP A (OFFICE): Rapid Strep A Screen: NEGATIVE

## 2024-08-28 MED ORDER — AMOXICILLIN 500 MG PO CAPS: 500.0000 mg | ORAL_CAPSULE | Freq: Two times a day (BID) | ORAL | 0 refills | Status: AC

## 2024-08-28 NOTE — Assessment & Plan Note (Signed)
 Advised to start b12 1000mcg OTC.  Pending recheck in 3 mos

## 2024-08-28 NOTE — Patient Instructions (Signed)
 Start over-the-counter B12 and iron supplement.  Will recheck these levels in a couple of months.

## 2024-08-28 NOTE — Assessment & Plan Note (Signed)
 Afebrile.  Duration 1 day.  Strep test negative.  Counseled patient more likely viral pharyngitis.  Conservative therapy appropriate.  As we anticipate icy weather over the weekend, I did send in amoxicillin  500 twice daily x 10 days to start if symptoms were to persist or worsen.  Advise probiotics.

## 2024-08-28 NOTE — Progress Notes (Signed)
 "  Assessment & Plan:  Sore throat -     POCT rapid strep A  Pharyngitis, unspecified etiology Assessment & Plan: Afebrile.  Duration 1 day.  Strep test negative.  Counseled patient more likely viral pharyngitis.  Conservative therapy appropriate.  As we anticipate icy weather over the weekend, I did send in amoxicillin  500 twice daily x 10 days to start if symptoms were to persist or worsen.  Advise probiotics.  Orders: -     Amoxicillin ; Take 1 capsule (500 mg total) by mouth 2 (two) times daily for 10 days.  Dispense: 20 capsule; Refill: 0  B12 deficiency Assessment & Plan: Advised to start b12 1000mcg OTC.  Pending recheck in 3 mos  Orders: -     B12 and Folate Panel; Future  Low ferritin Assessment & Plan: Ferritin < 10. She is not anemic. Advised to start over-the-counter multivitamin with B12 and ferrous sulfate.  Pending repeat labs in 3 mos.  Orders: -     Iron, TIBC and Ferritin Panel; Future  Fibromyalgia Assessment & Plan: Chronic, improved.  Reviewed rheumatology note.  Agree with trial of Cymbalta 30 mg.  She is no longer on Zoloft.  Continue Flexeril  5 mg nightly as needed      Return precautions given.   Risks, benefits, and alternatives of the medications and treatment plan prescribed today were discussed, and patient expressed understanding.   Education regarding symptom management and diagnosis given to patient on AVS either electronically or printed.  Return in about 6 months (around 02/25/2025).  Rollene Northern, FNP  Subjective:    Patient ID: Alejandra Cook, female    DOB: [birth year], 44 y.o.   MRN: [id number]  CC: Alejandra Cook is a 45 y.o. female who presents today for follow up.   HPI: HPI Discussed the use of AI scribe software for clinical note transcription with the patient, who gave verbal consent to proceed.  History of Present Illness   Alejandra Cook is a 44 year old female who presents with joint pain.  She  experiences joint pain primarily in her elbows, fingers, and hips. These symptoms are persistent but manageable, with movement and stretching providing relief. She has not taken ibuprofen  or Flexeril  this week, but during more severe episodes, she increases her Flexeril  dose from 5 mg to 10 mg at night for relaxation. She continues to take 600 mg of gabapentin  nightly, as prescribed by neurology.  She recently switched from Zoloft to Cymbalta, initiated by her gynecologist, but has not noticed a significant difference in her symptoms. Previously, she was on 100 mg of Zoloft.  She has a history of low B12 levels, with a recent measurement of 290. She is currently taking B12 supplements.    She mentions experiencing a sore throat with a sensation of a lump on the right side, which started last night. Denies trouble swallowing, cough, ear pain or fever. No recent antibiotic use. 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 Consult with rheumatology 07/14/2024 for fibromyalgia  07/07/24 weak positive anti-CCP of 22, RF negative, ESR within normal limits, CRP within normal limits, and ANA negative.   07/15/24 tTG Ab IgA < 1.0  Intrinsic factor negative  Allergies: Patient has no known allergies. Medications Ordered Prior to Encounter[1]  Review of Systems  Constitutional:  Negative for chills and fever.  HENT:  Positive for sore throat. Negative  for congestion, sinus pressure and sinus pain.   Respiratory:  Negative for cough and shortness of breath.   Cardiovascular:  Negative for chest pain and palpitations.  Gastrointestinal:  Negative for nausea and vomiting.  Musculoskeletal:  Positive for arthralgias.      Objective:    BP 120/78   Pulse 96   Temp 98.2 F (36.8 C) (Oral)   Ht 5' 3 (1.6 m)   Wt 154 lb 12.8 oz (70.2 kg)   SpO2 99%   BMI 27.42 kg/m  BP Readings from Last 3 Encounters:  08/28/24  120/78  07/15/24 128/86  07/07/24 128/70   Wt Readings from Last 3 Encounters:  08/28/24 154 lb 12.8 oz (70.2 kg)  07/15/24 152 lb 3.2 oz (69 kg)  07/07/24 146 lb 6.4 oz (66.4 kg)    Physical Exam Vitals reviewed.  Constitutional:      Appearance: She is well-developed.  HENT:     Head: Normocephalic and atraumatic.     Right Ear: Hearing, ear canal and external ear normal. No decreased hearing noted. No drainage, swelling or tenderness. No middle ear effusion. No foreign body. Tympanic membrane is bulging (slight). Tympanic membrane is not erythematous.     Left Ear: Hearing, ear canal and external ear normal. No decreased hearing noted. No drainage, swelling or tenderness.  No middle ear effusion. No foreign body. Tympanic membrane is bulging (slight). Tympanic membrane is not erythematous.     Nose: Nose normal. No rhinorrhea.     Right Sinus: No maxillary sinus tenderness or frontal sinus tenderness.     Left Sinus: No maxillary sinus tenderness or frontal sinus tenderness.     Mouth/Throat:     Pharynx: Uvula midline. No oropharyngeal exudate or posterior oropharyngeal erythema.     Tonsils: No tonsillar abscesses.  Eyes:     Conjunctiva/sclera: Conjunctivae normal.  Cardiovascular:     Rate and Rhythm: Regular rhythm.     Pulses: Normal pulses.     Heart sounds: Normal heart sounds.  Pulmonary:     Effort: Pulmonary effort is normal.     Breath sounds: Normal breath sounds. No wheezing, rhonchi or rales.  Lymphadenopathy:     Head:     Right side of head: No submental, submandibular, tonsillar, preauricular, posterior auricular or occipital adenopathy.     Left side of head: No submental, submandibular, tonsillar, preauricular, posterior auricular or occipital adenopathy.     Cervical: No cervical adenopathy.  Skin:    General: Skin is warm and dry.  Neurological:     Mental Status: She is alert.  Psychiatric:        Speech: Speech normal.        Behavior: Behavior  normal.        Thought Content: Thought content normal.            [1]  Current Outpatient Medications on File Prior to Visit  Medication Sig Dispense Refill   buPROPion  (WELLBUTRIN  XL) 300 MG 24 hr tablet TAKE 1 TABLET BY MOUTH EVERY DAY IN THE MORNING 90 tablet 1   butalbital-acetaminophen -caffeine (FIORICET) 50-325-40 MG tablet TAKE 1 TABLET BY MOUTH 2 (TWO) TIMES DAILY AS NEEDED FOR HEADACHE     cyclobenzaprine  (FLEXERIL ) 5 MG tablet Take 1 tablet (5 mg total) by mouth 2 (two) times daily as needed for muscle spasms. 30 tablet 1   CYMBALTA 30 MG capsule Take 1 capsule every day by oral route.     gabapentin  (NEURONTIN ) 300 MG capsule  Take 600 mg by mouth at bedtime.     ibuprofen  (ADVIL ) 800 MG tablet Take 1 tablet (800 mg total) by mouth every 8 (eight) hours as needed. 30 tablet 0   LO LOESTRIN FE 1 MG-10 MCG / 10 MCG tablet Take 1 tablet by mouth daily. as directed  9   ondansetron  (ZOFRAN ) 4 MG tablet TAKE 1 TABLET (4 MG TOTAL) BY MOUTH DAILY AS NEEDED FOR NAUSEA OR VOMITING. 18 tablet 1   promethazine  (PHENERGAN ) 12.5 MG tablet Take 1-2 tablets (12.5-25 mg total) by mouth once as needed for up to 1 dose for nausea or vomiting. With migraines. 20 tablet 2   QULIPTA 60 MG TABS Take 60 mg by mouth.     rizatriptan  (MAXALT ) 10 MG tablet Take 1 tablet (10 mg total) by mouth as needed for migraine. May repeat in 2 hours if needed. Max dose 3 pills in 24 hours 10 tablet 1   SUMAtriptan  (IMITREX ) 100 MG tablet Take by mouth as needed.     UBRELVY 100 MG TABS Take 1 tablet by mouth daily as needed.     zonisamide (ZONEGRAN) 100 MG capsule take 2 tabs at hs  2   No current facility-administered medications on file prior to visit.   "

## 2024-08-28 NOTE — Assessment & Plan Note (Signed)
 Ferritin < 10. She is not anemic. Advised to start over-the-counter multivitamin with B12 and ferrous sulfate.  Pending repeat labs in 3 mos.

## 2024-08-28 NOTE — Assessment & Plan Note (Signed)
 Chronic, improved.  Reviewed rheumatology note.  Agree with trial of Cymbalta 30 mg.  She is no longer on Zoloft.  Continue Flexeril  5 mg nightly as needed

## 2024-10-13 ENCOUNTER — Ambulatory Visit: Admitting: Physician Assistant
